# Patient Record
Sex: Female | Born: 1945
Health system: Southern US, Community
[De-identification: ages and names within clinical notes are randomized; demographics above are authoritative.]

## PROBLEM LIST (undated history)

## (undated) DIAGNOSIS — Z8719 Personal history of other diseases of the digestive system: Secondary | ICD-10-CM

## (undated) DIAGNOSIS — E785 Hyperlipidemia, unspecified: Secondary | ICD-10-CM

## (undated) DIAGNOSIS — K5790 Diverticulosis of intestine, part unspecified, without perforation or abscess without bleeding: Secondary | ICD-10-CM

## (undated) DIAGNOSIS — K219 Gastro-esophageal reflux disease without esophagitis: Secondary | ICD-10-CM

## (undated) DIAGNOSIS — M1990 Unspecified osteoarthritis, unspecified site: Secondary | ICD-10-CM

## (undated) DIAGNOSIS — E119 Type 2 diabetes mellitus without complications: Secondary | ICD-10-CM

## (undated) DIAGNOSIS — I10 Essential (primary) hypertension: Secondary | ICD-10-CM

## (undated) DIAGNOSIS — Z923 Personal history of irradiation: Secondary | ICD-10-CM

## (undated) DIAGNOSIS — C50919 Malignant neoplasm of unspecified site of unspecified female breast: Secondary | ICD-10-CM

## (undated) HISTORY — PX: OTHER SURGICAL HISTORY: SHX169

## (undated) HISTORY — PX: NASAL SINUS SURGERY: SHX719

## (undated) HISTORY — PX: HERNIA REPAIR: SHX51

## (undated) HISTORY — DX: Malignant neoplasm of unspecified site of unspecified female breast: C50.919

## (undated) HISTORY — DX: Essential (primary) hypertension: I10

## (undated) HISTORY — DX: Hyperlipidemia, unspecified: E78.5

## (undated) HISTORY — DX: Diverticulosis of intestine, part unspecified, without perforation or abscess without bleeding: K57.90

## (undated) HISTORY — DX: Gastro-esophageal reflux disease without esophagitis: K21.9

## (undated) HISTORY — PX: APPENDECTOMY: SHX54

---

## 2005-09-11 ENCOUNTER — Ambulatory Visit: Payer: Self-pay

## 2005-12-03 ENCOUNTER — Inpatient Hospital Stay: Payer: Self-pay | Admitting: Internal Medicine

## 2005-12-16 ENCOUNTER — Other Ambulatory Visit: Payer: Self-pay

## 2006-09-10 ENCOUNTER — Ambulatory Visit: Payer: Self-pay

## 2007-07-22 ENCOUNTER — Ambulatory Visit: Payer: Self-pay | Admitting: Internal Medicine

## 2007-10-27 ENCOUNTER — Ambulatory Visit: Payer: Self-pay

## 2008-11-01 ENCOUNTER — Ambulatory Visit: Payer: Self-pay

## 2008-11-16 ENCOUNTER — Ambulatory Visit: Payer: Self-pay | Admitting: Internal Medicine

## 2009-11-30 ENCOUNTER — Ambulatory Visit: Payer: Self-pay | Admitting: Internal Medicine

## 2010-07-07 ENCOUNTER — Ambulatory Visit: Payer: Self-pay | Admitting: Unknown Physician Specialty

## 2010-08-08 ENCOUNTER — Ambulatory Visit: Payer: Self-pay | Admitting: Ophthalmology

## 2010-12-11 ENCOUNTER — Ambulatory Visit: Payer: Self-pay | Admitting: Internal Medicine

## 2011-12-13 ENCOUNTER — Ambulatory Visit: Payer: Self-pay | Admitting: Internal Medicine

## 2012-04-26 ENCOUNTER — Emergency Department: Payer: Self-pay | Admitting: *Deleted

## 2012-10-14 DIAGNOSIS — Z923 Personal history of irradiation: Secondary | ICD-10-CM

## 2012-10-14 DIAGNOSIS — C50919 Malignant neoplasm of unspecified site of unspecified female breast: Secondary | ICD-10-CM

## 2012-10-14 HISTORY — PX: BREAST LUMPECTOMY: SHX2

## 2012-10-14 HISTORY — DX: Personal history of irradiation: Z92.3

## 2012-10-14 HISTORY — DX: Malignant neoplasm of unspecified site of unspecified female breast: C50.919

## 2012-12-14 ENCOUNTER — Ambulatory Visit: Payer: Self-pay

## 2012-12-21 ENCOUNTER — Ambulatory Visit: Payer: Self-pay

## 2013-02-01 ENCOUNTER — Ambulatory Visit: Payer: Self-pay | Admitting: Surgery

## 2013-02-04 HISTORY — PX: BREAST BIOPSY: SHX20

## 2013-02-09 ENCOUNTER — Ambulatory Visit: Payer: Self-pay | Admitting: Surgery

## 2013-02-09 LAB — BASIC METABOLIC PANEL
BUN: 16 mg/dL (ref 7–18)
Co2: 29 mmol/L (ref 21–32)
Creatinine: 0.88 mg/dL (ref 0.60–1.30)
Osmolality: 274 (ref 275–301)
Potassium: 4 mmol/L (ref 3.5–5.1)
Sodium: 137 mmol/L (ref 136–145)

## 2013-02-09 LAB — CBC WITH DIFFERENTIAL/PLATELET
Basophil #: 0.1 10*3/uL (ref 0.0–0.1)
Basophil %: 1 %
Eosinophil %: 1 %
Lymphocyte %: 42.8 %
MCH: 27.8 pg (ref 26.0–34.0)
MCV: 85 fL (ref 80–100)
Monocyte %: 9.3 %
Neutrophil #: 3.5 10*3/uL (ref 1.4–6.5)
Platelet: 306 10*3/uL (ref 150–440)
RBC: 4.33 10*6/uL (ref 3.80–5.20)
RDW: 16.3 % — ABNORMAL HIGH (ref 11.5–14.5)
WBC: 7.7 10*3/uL (ref 3.6–11.0)

## 2013-02-15 ENCOUNTER — Ambulatory Visit: Payer: Self-pay | Admitting: Surgery

## 2013-03-02 ENCOUNTER — Ambulatory Visit: Payer: Self-pay | Admitting: Internal Medicine

## 2013-03-14 ENCOUNTER — Ambulatory Visit: Payer: Self-pay | Admitting: Internal Medicine

## 2013-03-15 ENCOUNTER — Ambulatory Visit: Payer: Self-pay | Admitting: Surgery

## 2013-04-08 LAB — CBC CANCER CENTER
Basophil #: 0 x10 3/mm (ref 0.0–0.1)
Basophil %: 0.2 %
Eosinophil #: 0.2 x10 3/mm (ref 0.0–0.7)
Eosinophil %: 3 %
HGB: 11.4 g/dL — ABNORMAL LOW (ref 12.0–16.0)
Lymphocyte %: 28.9 %
Monocyte #: 0.6 x10 3/mm (ref 0.2–0.9)
Monocyte %: 8.1 %
Neutrophil #: 4.1 x10 3/mm (ref 1.4–6.5)
Neutrophil %: 59.8 %
Platelet: 337 x10 3/mm (ref 150–440)
RBC: 4.08 10*6/uL (ref 3.80–5.20)
RDW: 15.8 % — ABNORMAL HIGH (ref 11.5–14.5)
WBC: 6.9 x10 3/mm (ref 3.6–11.0)

## 2013-04-08 LAB — HEPATIC FUNCTION PANEL A (ARMC)
Albumin: 3.6 g/dL (ref 3.4–5.0)
Bilirubin, Direct: 0.1 mg/dL (ref 0.00–0.20)
SGOT(AST): 28 U/L (ref 15–37)

## 2013-04-13 ENCOUNTER — Ambulatory Visit: Payer: Self-pay | Admitting: Internal Medicine

## 2013-07-26 ENCOUNTER — Emergency Department: Payer: Self-pay | Admitting: Emergency Medicine

## 2013-09-24 ENCOUNTER — Ambulatory Visit: Payer: Self-pay | Admitting: Internal Medicine

## 2013-11-11 ENCOUNTER — Ambulatory Visit: Payer: Self-pay | Admitting: Internal Medicine

## 2013-11-11 LAB — CBC CANCER CENTER
Basophil #: 0.1 x10 3/mm (ref 0.0–0.1)
Basophil %: 1.3 %
Eosinophil #: 0.1 x10 3/mm (ref 0.0–0.7)
Eosinophil %: 1.7 %
HCT: 36.4 % (ref 35.0–47.0)
HGB: 11.8 g/dL — ABNORMAL LOW (ref 12.0–16.0)
Lymphocyte #: 2 x10 3/mm (ref 1.0–3.6)
Lymphocyte %: 40.2 %
MCH: 28.5 pg (ref 26.0–34.0)
MCHC: 32.4 g/dL (ref 32.0–36.0)
MCV: 88 fL (ref 80–100)
MONOS PCT: 7.7 %
Monocyte #: 0.4 x10 3/mm (ref 0.2–0.9)
Neutrophil #: 2.5 x10 3/mm (ref 1.4–6.5)
Neutrophil %: 49.1 %
Platelet: 271 x10 3/mm (ref 150–440)
RBC: 4.13 10*6/uL (ref 3.80–5.20)
RDW: 16.1 % — ABNORMAL HIGH (ref 11.5–14.5)
WBC: 5.1 x10 3/mm (ref 3.6–11.0)

## 2013-11-11 LAB — HEPATIC FUNCTION PANEL A (ARMC)
ALT: 19 U/L (ref 12–78)
Albumin: 3.5 g/dL (ref 3.4–5.0)
Alkaline Phosphatase: 75 U/L
BILIRUBIN DIRECT: 0.3 mg/dL — AB (ref 0.00–0.20)
BILIRUBIN TOTAL: 0.4 mg/dL (ref 0.2–1.0)
SGOT(AST): 23 U/L (ref 15–37)
Total Protein: 7.4 g/dL (ref 6.4–8.2)

## 2013-11-11 LAB — CREATININE, SERUM
Creatinine: 0.88 mg/dL (ref 0.60–1.30)
EGFR (African American): >60
EGFR (Non-African Amer.): >60

## 2013-11-14 ENCOUNTER — Ambulatory Visit: Payer: Self-pay | Admitting: Internal Medicine

## 2013-11-14 DIAGNOSIS — K5790 Diverticulosis of intestine, part unspecified, without perforation or abscess without bleeding: Secondary | ICD-10-CM

## 2013-11-14 HISTORY — DX: Diverticulosis of intestine, part unspecified, without perforation or abscess without bleeding: K57.90

## 2013-11-15 ENCOUNTER — Ambulatory Visit: Payer: Self-pay | Admitting: Gastroenterology

## 2013-12-15 ENCOUNTER — Ambulatory Visit: Payer: Self-pay | Admitting: Obstetrics and Gynecology

## 2014-01-17 DIAGNOSIS — E119 Type 2 diabetes mellitus without complications: Secondary | ICD-10-CM | POA: Insufficient documentation

## 2014-01-17 DIAGNOSIS — M542 Cervicalgia: Secondary | ICD-10-CM | POA: Insufficient documentation

## 2014-01-17 DIAGNOSIS — I10 Essential (primary) hypertension: Secondary | ICD-10-CM | POA: Insufficient documentation

## 2014-01-17 DIAGNOSIS — E7800 Pure hypercholesterolemia, unspecified: Secondary | ICD-10-CM | POA: Insufficient documentation

## 2014-01-17 DIAGNOSIS — D649 Anemia, unspecified: Secondary | ICD-10-CM | POA: Insufficient documentation

## 2014-05-12 ENCOUNTER — Ambulatory Visit: Payer: Self-pay | Admitting: Internal Medicine

## 2014-05-12 LAB — CREATININE, SERUM
Creatinine: 1.13 mg/dL (ref 0.60–1.30)
EGFR (African American): 57 — ABNORMAL LOW
EGFR (Non-African Amer.): 49 — ABNORMAL LOW

## 2014-05-12 LAB — HEPATIC FUNCTION PANEL A (ARMC)
ALT: 18 U/L
Albumin: 3.3 g/dL — ABNORMAL LOW (ref 3.4–5.0)
Alkaline Phosphatase: 78 U/L
Bilirubin, Direct: <0.05 mg/dL (ref 0.00–0.20)
Bilirubin,Total: 0.3 mg/dL (ref 0.2–1.0)
SGOT(AST): 24 U/L (ref 15–37)
Total Protein: 7.1 g/dL (ref 6.4–8.2)

## 2014-05-12 LAB — CBC CANCER CENTER
Basophil #: 0.1 x10 3/mm (ref 0.0–0.1)
Basophil %: 1.2 %
EOS ABS: 0.1 x10 3/mm (ref 0.0–0.7)
Eosinophil %: 2.2 %
HCT: 36.2 % (ref 35.0–47.0)
HGB: 11.8 g/dL — ABNORMAL LOW (ref 12.0–16.0)
LYMPHS PCT: 43.7 %
Lymphocyte #: 2.3 x10 3/mm (ref 1.0–3.6)
MCH: 28.9 pg (ref 26.0–34.0)
MCHC: 32.5 g/dL (ref 32.0–36.0)
MCV: 89 fL (ref 80–100)
MONOS PCT: 7.8 %
Monocyte #: 0.4 x10 3/mm (ref 0.2–0.9)
NEUTROS PCT: 45.1 %
Neutrophil #: 2.4 x10 3/mm (ref 1.4–6.5)
Platelet: 242 x10 3/mm (ref 150–440)
RBC: 4.08 10*6/uL (ref 3.80–5.20)
RDW: 15.6 % — ABNORMAL HIGH (ref 11.5–14.5)
WBC: 5.2 x10 3/mm (ref 3.6–11.0)

## 2014-05-14 ENCOUNTER — Ambulatory Visit: Payer: Self-pay | Admitting: Internal Medicine

## 2014-08-14 DIAGNOSIS — M8580 Other specified disorders of bone density and structure, unspecified site: Secondary | ICD-10-CM | POA: Insufficient documentation

## 2014-08-14 DIAGNOSIS — M810 Age-related osteoporosis without current pathological fracture: Secondary | ICD-10-CM | POA: Insufficient documentation

## 2014-10-14 HISTORY — PX: BREAST BIOPSY: SHX20

## 2014-12-06 DIAGNOSIS — L280 Lichen simplex chronicus: Secondary | ICD-10-CM | POA: Insufficient documentation

## 2014-12-19 ENCOUNTER — Ambulatory Visit: Payer: Self-pay | Admitting: Obstetrics and Gynecology

## 2014-12-20 ENCOUNTER — Ambulatory Visit: Payer: Self-pay | Admitting: Obstetrics and Gynecology

## 2015-01-24 ENCOUNTER — Ambulatory Visit
Admit: 2015-01-24 | Disposition: A | Discharge status: Home / Self Care | Payer: Self-pay | Attending: Surgery | Admitting: Surgery

## 2015-02-03 NOTE — Op Note (Signed)
PATIENT NAME:  Katie Thompson, Katie Thompson MR#:  [id number] DATE OF BIRTH:  03-05-1946  DATE OF PROCEDURE:  03/15/2013  PREOPERATIVE DIAGNOSIS: Carcinoma of the left breast.   POSTOPERATIVE DIAGNOSIS: Carcinoma of the left breast.  PROCEDURE: Insertion of MammoSite catheter into the left breast mass.   SURGEON: Rochel Brome, MD   ANESTHESIA: Local 1% Xylocaine with epinephrine with monitored anesthesia care.   INDICATIONS: This 69 year old female recently had a left partial mastectomy with findings of ductal carcinoma in situ, and Dr. Donella Stade saw her for consultation and recommended MammoSite therapy.  I did an ultrasound in the office, and it appeared to be an adequate seroma with adequate amount of subcutaneous tissue.   DESCRIPTION OF PROCEDURE: The patient was placed on the operating table in the supine position and sedated and monitored by the anesthesia staff. The left arm was placed in a lateral arm support. The left breast was examined with ultrasound demonstrating location of the seroma just below her scar. The breast was prepared with ChloraPrep and draped in a sterile manner. The ultrasound was placed into its sleeve and further identified the seroma. Next, the skin lateral to this seroma was infiltrated with 1% Xylocaine with epinephrine. A lancing incision was made approximately 1 cm in length. Using ultrasound guidance, the trocar was advanced into the seroma and drained some serosanguineous fluid.  I then removed the trocar, inserted the Frazier suction, and suctioned out some additional serosanguineous fluid. Subsequently, the MammoSite catheter was inserted. It was inflated with 10% solution of Isovue with saline and inflated with 30 mL of water.  The subsequent ultrasound did demonstrate that the balloon was approximately 9 mm from the skin, and was no drainage from the incision. The site was dressed with gauze, benzoin and paper tape.       The patient tolerated the procedure satisfactorily  and was then prepared for transfer to the recovery room.  ____________________________ Lenna Sciara. Rochel Brome, MD jws:cb D: 03/15/2013 15:23:43 ET T: 03/15/2013 20:34:15 ET JOB#: [id number]  cc: Loreli Dollar, MD, <Dictator> Loreli Dollar MD ELECTRONICALLY SIGNED 03/19/2013 9:30

## 2015-02-03 NOTE — Op Note (Signed)
PATIENT NAME:  Katie Thompson, Katie Thompson MR#:  [id number] DATE OF BIRTH:  September 30, 1946  DATE OF PROCEDURE:  02/15/2013  PREOPERATIVE DIAGNOSIS: Ductal carcinoma in situ, left breast.   POSTOPERATIVE DIAGNOSIS: Ductal carcinoma in situ, left breast.   PROCEDURE: Left partial mastectomy.   ANESTHESIA: General.   SURGEON: Rochel Brome.   INDICATIONS: This 69 year old female recently had microcalcifications found in the upper outer quadrant, left breast. Needle biopsy demonstrated ductal carcinoma in situ, and surgery was recommended for definitive treatment.   The patient was placed on the operating table in the supine position under general anesthesia. The dressing was removed from the left breast, exposing the Kopan's wire, which was cut 2 cm from the skin. The operative site was prepared with ChloraPrep and draped in a sterile manner.   Mammogram images were reviewed seeing the location of the biopsy marker and the Kopan's wire. There was a palpable mass within the breast just below the wire which appeared that it may be a hematoma, and she did not have a palpable mass before the biopsy, but I did elect to remove the mass with the wire. An obliquely-oriented curvilinear incision was made from approximately  12 o'clock to the 2 o'clock positions in the peripheral aspect of the left breast, removing an ellipse of skin which was approximately 12 mm in width. The dissection was carried down through subcutaneous tissues. I encountered the wire and palpated the mass during the course of the dissection. I removed the entire palpable mass, which was about 4 cm in dimension, also surrounding normal-appearing breast tissue and resected down to the tip of the wire. The wire was left in the specimen as it was excised. The 2 o'clock position of the skin ellipse was tagged with a 3-0 nylon stitch, and also margin maps were sutured to the specimen to mark the cranial, caudal, medial, lateral, and deep margins and the specimen  was sent for specimen mammogram and pathology to check for margins.   The wound was inspected. It was noted that 1 clamped artery was suture-ligated with  4-0 chromic. It was noted during the course of the procedure a number of small bleeding points were cauterized. Additional small bleeding points were cauterized. Also, tissues were infiltrated with a total of 20 mL of 0.5% Sensorcaine with epinephrine. Hemostasis was subsequently intact. The subcutaneous tissues were closed with interrupted 4-0 chromic. The skin was closed with a running 5-0 Monocryl subcuticular suture.   I subsequently waited for the pathologist's call. Reported that the margins appeared to be clear and the biopsy cavity was closest to the deep margin. Subsequently the wound was dressed with Dermabond. The patient appeared to be in satisfactory condition and was prepared for transfer to the recovery room.    ____________________________ Lenna Sciara. Rochel Brome, MD jws:dm D: 02/15/2013 10:54:56 ET T: 02/15/2013 11:12:46 ET JOB#: [id number]  cc: Loreli Dollar, MD, <Dictator> Loreli Dollar MD ELECTRONICALLY SIGNED 02/16/2013 21:11

## 2015-02-03 NOTE — Consult Note (Signed)
Reason for Visit: This 69 year old Female patient presents to the clinic for initial evaluation of  breast cancer .   Referred by Dr. Ma Hillock.  Diagnosis:  Chief Complaint/Diagnosis   69 year old female with stage 0 (Tis N0 M0) ductal carcinoma in situ ER/PR positive status post wide local excision.  Pathology Report pathology report reviewed   Imaging Report mammograms reviewed   Referral Report clinical notes reviewed   Planned Treatment Regimen possible accelerated partial breast radiation   HPI   patient is a 69 year old female with significant past medical history for borderline diabetes hypertension was found to have an abnormal mammogram of the left breast in early March showingabnormal microcalcifications in the left upper breast. She underwent a biopsy showing ductal carcinoma in situ then underwent wide local excision on May 5 showing grade 2 perform type ductal carcinoma in situ with clear margins tumor was ER/PR positive. She's been seen by medical oncology will be prescribed tamoxifen after completion of radiation. She is seen today for consideration of treatment. She is doing well. She specifically denies breast tenderness cough or bone pain.  Past Hx:    GERD:    diverticulosis:    Hypercholesterolemia:    sinusitis:    Hypertension:    Diabetes:    hernia repair:    lumpectomy X 2:    appendectomy:   Past, Family and Social History:  Past Medical History positive   Cardiovascular hyperlipidemia; hypertension   Gastrointestinal GERD; diverticulitis   Endocrine diabetes mellitus   Infections bacterial meningitis in 2007   Past Surgical History herniorrhaphy repair, breast biopsy x2, appendectomy,bilateral tubal ligation, appendectomy, sinus surgery   Past Medical History Comments sinusitis   Family History positive   Family History Comments family history of adult onset diabetes and hypertension, mother with ovarian cancer, aunt with history of  breast cancer   Social History noncontributory   Allergies:   Codeine: GI Distress  Tequin: Hives  Home Meds:  Home Medications: Medication Instructions Status  aspirin 81 mg oral tablet 1 tab(s) orally once a day (in the morning). Off for 2 weeks due to biopsy and surgery Active  hydrochlorothiazide 25 mg oral tablet 1 tab(s) orally once a day (in the morning) Active  Zegerid OTC 20 mg-1100 mg oral capsule 1 cap(s) orally once a day (in the morning) Active  atorvastatin 10 mg oral tablet 1 tab(s) orally once a day (in the morning) Active  verapamil 24 hour extended release 240 mg/24 hours oral capsule, extended release 1 cap(s) orally once a day (in the morning) Active  Vitamin D3 1000 intl units oral tablet 1 tab(s) orally 2 times a day Active  Norco 325 mg-5 mg oral tablet 1-2 tab(s) orally every 4 hours as needed for pain Active   Review of Systems:  General negative   Performance Status (ECOG) 0   Skin negative   Breast see HPI   Ophthalmologic negative   ENMT negative   Respiratory and Thorax negative   Cardiovascular negative   Gastrointestinal negative   Genitourinary negative   Musculoskeletal negative   Neurological negative   Psychiatric negative   Hematology/Lymphatics negative   Endocrine negative   Allergic/Immunologic negative   Review of Systems   according to nurse's notesPatient denies any weight loss, fatigue, weakness, fever, chills or night sweats. Patient denies any loss of vision, blurred vision. Patient denies any ringing  of the ears or hearing loss. No irregular heartbeat. Patient denies heart murmur or history of fainting.  Patient denies any chest pain or pain radiating to her upper extremities. Patient denies any shortness of breath, difficulty breathing at night, cough or hemoptysis. Patient denies any swelling in the lower legs. Patient denies any nausea vomiting, vomiting of blood, or coffee ground material in the vomitus. Patient  denies any stomach pain. Patient states has had normal bowel movements no significant constipation or diarrhea. Patient denies any dysuria, hematuria or significant nocturia. Patient denies any problems walking, swelling in the joints or loss of balance. Patient denies any skin changes, loss of hair or loss of weight. Patient denies any excessive worrying or anxiety or significant depression. Patient denies any problems with insomnia. Patient denies excessive thirst, polyuria, polydipsia. Patient denies any swollen glands, patient denies easy bruising or easy bleeding. Patient denies any recent infections, allergies or URI. Patient "s visual fields have not changed significantly in recent time.  Nursing Notes:  Nursing Vital Signs and Chemo Nursing Nursing Notes: *CC Vital Signs Flowsheet:   22-May-14 13:20  Temp Temperature 97.1  Pulse Pulse 88  Respirations Respirations 20  SBP SBP 159  DBP DBP 88  Pain Scale (0-10)  0  Current Weight (kg) (kg) 88.6  Height (cm) centimeters 161.5  BSA (m2) 1.9   Physical Exam:  General/Skin/HEENT:  General normal   Skin normal   Eyes normal   ENMT normal   Head and Neck normal   Additional PE patient status post wide local excision of the left breast. Incision is well-healed. No dominant mass or nodularity is noted in either breast into position examined. No axillary or supraclavicular adenopathy is identified.   Breasts/Resp/CV/GI/GU:  Respiratory and Thorax normal   Cardiovascular normal   Gastrointestinal normal   Genitourinary normal   MS/Neuro/Psych/Lymph:  Musculoskeletal normal   Neurological normal   Lymphatics normal   Other Results:  Radiology Results: LabUnknown:    10-Mar-14 08:42, Digital Additional Views Lt Breast [facility])  PACS Image   [facility]:  Digital Additional Views Lt Breast (SCR)   REASON FOR EXAM:    av lt calcs  COMMENTS:       PROCEDURE: MAM - MAM DGTL ADD VW LT  SCR  - Dec 21 2012  8:42AM      RESULT:     Comparison: 12/14/2012, 12/13/2011, 12/11/2010, 11/30/2009.    Findings:    True lateral and spot compression magnification views were performed to   evaluate the small asymmetry in the central aspect of the posterior depth   of the left CC view. With these views, the asymmetry effaced and assumed   the appearance of normal fibroglandular tissue that was similar to prior     studies.    Spot compression magnification views were also performed of the cluster   of microcalcifications in the superior left breast. Again, these are   likely located in the lateral aspect of the left CC view. These are best   demonstrated on thespot compression magnification MLO view. Some   calcifications are round while others are amorphous. There is some motion   artifact on the CC and true lateral magnification views.    IMPRESSION:    1. BI-RADS: Category 4 - Suspicious Abnormality.  2. Surgical consultation and tissue diagnosis are recommended for the   small cluster of suspicious microcalcifications in the superior left   breast. This could be attempted with Stereotactic Guided Biopsy.   BREAST COMPOSITION: The breast compositionis SCATTERED FIBROGLANDULAR   TISSUE (glandular tissue is 25-50%).  Thank you for this opportunity to contribute to the care of your patient.    A NEGATIVE MAMMOGRAM REPORT DOES NOT PRECLUDE BIOPSY OR OTHER EVALUATION   OF A CLINICALLY PALPABLE OROTHERWISE SUSPICIOUS MASS OR LESION. BREAST   CANCER MAY NOT BE DETECTED BY MAMMOGRAPHY IN UP TO 10% OF CASES.         Verified By: Gregor Hams, M.D., MD   Relevent Results:   Relevant Scans and Labs mammograms were reviewed   Assessment and Plan: Impression:   ductal carcinoma in situ of the left breast as those wide local excision ER/PR positive and 69 year old female Plan:   based on the low volume of disease her age patient would be a good candidate for accelerated partial breast irradiation to  deliver 3400 cGy in 10 fractions at 340 cGy twice a day. I've gone over risks and benefits of all whole breast radiation as well as accelerated partial breast irradiation. We'll contact and Dr. Thompson Caul office for consideration of placing MammoSite catheter. She catheter not be a locally placed or if it's too close to skin or chest wall will go back and treated whole breast radiation therapy. Patient seems to comprehend my treatment plan well. Arrangements are made for MammoSite catheter placement.  I would like to take this opportunity to thank you for allowing me to continue to participate in this patient's care.  Electronic Signatures: Armstead Peaks (MD)  (Signed 28-May-14 14:16)  Authored: HPI, Diagnosis, Past Hx, PFSH, Allergies, Home Meds, ROS, Nursing Notes, Physical Exam, Other Results, Relevent Results, Encounter Assessment and Plan   Last Updated: 28-May-14 14:16 by Armstead Peaks (MD)

## 2015-02-03 NOTE — Op Note (Signed)
PATIENT NAME:  Katie Thompson, Katie Thompson MR#:  [id number] DATE OF BIRTH:  [birth year]  DATE OF PROCEDURE:  03/18/2013  DIAGNOSIS:  Breast cancer.    Ms. Mccullum underwent her 4th out of a planned 10 fractions for her left breast DCIS today.  The planned dose was delivered via (Dictation Anomaly) <<MISSING TEXT>> positions.  The planned source of activity was 7.09 Ci, which required 297.6 seconds to deliver.  I verified the measurement of the balloon placement along with physics, using a scout view.  I verified the position of the catheter and the correct position in the breast.  I observed the patients treatment.  She was discharged in stable condition and will return tomorrow for her 3rd and final treatment.    ____________________________ Thea Silversmith, MD sw:cc D: 03/18/2013 14:23:00 ET T: 03/18/2013 16:49:36 ET JOB#: [id number]  cc: Thea Silversmith, MD, <Dictator>

## 2015-02-06 LAB — SURGICAL PATHOLOGY

## 2015-03-22 ENCOUNTER — Other Ambulatory Visit: Payer: Self-pay | Admitting: Infectious Diseases

## 2015-03-22 DIAGNOSIS — R600 Localized edema: Secondary | ICD-10-CM

## 2015-03-23 ENCOUNTER — Ambulatory Visit
Admission: RE | Admit: 2015-03-23 | Discharge: 2015-03-23 | Disposition: A | Discharge status: Home / Self Care | Payer: BLUE CROSS/BLUE SHIELD | Source: Ambulatory Visit | Attending: Infectious Diseases | Admitting: Infectious Diseases

## 2015-03-23 DIAGNOSIS — R600 Localized edema: Secondary | ICD-10-CM | POA: Insufficient documentation

## 2015-03-23 DIAGNOSIS — M79604 Pain in right leg: Secondary | ICD-10-CM | POA: Diagnosis present

## 2015-04-25 ENCOUNTER — Other Ambulatory Visit: Payer: Self-pay | Admitting: Internal Medicine

## 2015-05-17 ENCOUNTER — Other Ambulatory Visit: Payer: Self-pay | Admitting: *Deleted

## 2015-05-17 DIAGNOSIS — D0512 Intraductal carcinoma in situ of left breast: Secondary | ICD-10-CM

## 2015-05-18 ENCOUNTER — Encounter: Payer: Self-pay | Admitting: Internal Medicine

## 2015-05-18 ENCOUNTER — Inpatient Hospital Stay: Payer: BLUE CROSS/BLUE SHIELD | Attending: Internal Medicine

## 2015-05-18 ENCOUNTER — Ambulatory Visit (HOSPITAL_BASED_OUTPATIENT_CLINIC_OR_DEPARTMENT_OTHER): Payer: BLUE CROSS/BLUE SHIELD | Admitting: Internal Medicine

## 2015-05-18 VITALS — BP 140/75 | HR 70 | Temp 97.8°F | Resp 18 | Ht 63.0 in | Wt 192.5 lb

## 2015-05-18 DIAGNOSIS — Z923 Personal history of irradiation: Secondary | ICD-10-CM | POA: Diagnosis not present

## 2015-05-18 DIAGNOSIS — Z170 Estrogen receptor positive status [ER+]: Secondary | ICD-10-CM | POA: Insufficient documentation

## 2015-05-18 DIAGNOSIS — D0512 Intraductal carcinoma in situ of left breast: Secondary | ICD-10-CM

## 2015-05-18 DIAGNOSIS — Z79810 Long term (current) use of selective estrogen receptor modulators (SERMs): Secondary | ICD-10-CM | POA: Diagnosis not present

## 2015-05-18 LAB — CBC WITH DIFFERENTIAL/PLATELET
BASOS ABS: 0 10*3/uL (ref 0–0.1)
BASOS PCT: 1 %
Eosinophils Absolute: 0.1 10*3/uL (ref 0–0.7)
Eosinophils Relative: 2 %
HCT: 36.1 % (ref 35.0–47.0)
Hemoglobin: 11.8 g/dL — ABNORMAL LOW (ref 12.0–16.0)
LYMPHS ABS: 2.2 10*3/uL (ref 1.0–3.6)
Lymphocytes Relative: 37 %
MCH: 28.3 pg (ref 26.0–34.0)
MCHC: 32.7 g/dL (ref 32.0–36.0)
MCV: 86.6 fL (ref 80.0–100.0)
MONO ABS: 0.4 10*3/uL (ref 0.2–0.9)
MONOS PCT: 7 %
NEUTROS ABS: 3.2 10*3/uL (ref 1.4–6.5)
Neutrophils Relative %: 53 %
PLATELETS: 267 10*3/uL (ref 150–440)
RBC: 4.17 MIL/uL (ref 3.80–5.20)
RDW: 15.6 % — ABNORMAL HIGH (ref 11.5–14.5)
WBC: 5.9 10*3/uL (ref 3.6–11.0)

## 2015-05-18 LAB — HEPATIC FUNCTION PANEL
ALT: 12 U/L — ABNORMAL LOW (ref 14–54)
AST: 21 U/L (ref 15–41)
Albumin: 3.8 g/dL (ref 3.5–5.0)
Alkaline Phosphatase: 78 U/L (ref 38–126)
BILIRUBIN INDIRECT: 0 mg/dL — AB (ref 0.3–0.9)
BILIRUBIN TOTAL: 0.6 mg/dL (ref 0.3–1.2)
Bilirubin, Direct: <1 mg/dL — ABNORMAL HIGH (ref 0.1–0.5)
TOTAL PROTEIN: 7.5 g/dL (ref 6.5–8.1)

## 2015-05-18 LAB — CREATININE, SERUM
CREATININE: 1.12 mg/dL — AB (ref 0.44–1.00)
GFR calc Af Amer: 56 mL/min — ABNORMAL LOW (ref >60)
GFR calc non Af Amer: 48 mL/min — ABNORMAL LOW (ref >60)

## 2015-05-18 NOTE — Progress Notes (Signed)
Pt had mammogram in march 2016 and it showed probable fibrosis and sent her for u/s and then bx 3/816 and there was no malignancy. She has not felt any abnormalities on breast exam that she does herself.  She did have right leg swelling and saw PCP who did doppler that was negative

## 2015-05-26 NOTE — Progress Notes (Signed)
[name]  [phone] [phone]     ID: Katie Thompson OB: 05-Mar-1946  MR#: [id number]  BDZ#:[id number]  Patient Care Team: Katie Prows, MD as PCP - General (Infectious Diseases)  CHIEF COMPLAINT/DIAGNOSIS:  Grade 2 ductal carcinoma in situ  (pTis Nx, cribriform type)  of the left breast status post partial mastectomy on 02/15/2013.  ER positive (75%), PR positive (25%)  -   Patient completed radiation, started Tamoxifen therapy end of June 2014 (planned for 5 years).   HISTORY OF PRESENT ILLNESS:  Patient returns for continued oncology evaluation, she was seen 1 year ago. States that she had recent lower leg swelling but Doppler by primary physician on 04/02/2015 was negative for DVT yet states that swelling is doing better at this time. States that she does remain physically active and ambulatory. She is on tamoxifen treatment, states that she is tolerating this well and denies new side effects.  Denies history of thromboembolic phenomena including DVT, PE, TIA, stroke or coronary artery disease.  Denies any abnormal vaginal bleeding. Denies any other new complaints, states that she has chronic peripheral neuropathy symptoms in her hands which is unchanged. Appetite is steady, denies unintentional weight loss.  Denies feeling any new breast masses on self-exam. No new bone pains.  REVIEW OF SYSTEMS:   ROS As in HPI above. In addition, no fever, chills or sweats. No new headaches or focal weakness.  No new mood disturbances. No  sore throat, cough, shortness of breath, sputum, hemoptysis or chest pain. No dizziness or palpitation. No abdominal pain, constipation, diarrhea, dysuria or hematuria. No new skin rash or bleeding symptoms. No new paresthesias in extremities.  Otherwise, a complete review of systems is negative.  PAST MEDICAL HISTORY: Reviewed. Past Medical History  Diagnosis Date  . Breast cancer   . Hypertension   . Hyperlipidemia   .  GERD (gastroesophageal reflux disease)   . Diverticulosis feb. 2015    from colonoscopy          Hypertension  Hyperlipidemia  GERD  Borderline diabetes not requiring medical treatment  Hernia repair  Bacterial meningitis 2007  Bilateral tubal ligation  Appendectomy  Sinus surgery  Colonoscopy February 2015 reported diverticulosis.  PAST SURGICAL HISTORY: Reviewed. Past Surgical History  Procedure Laterality Date  . Appendectomy    . Hernia repair    . Nasal sinus surgery      FAMILY HISTORY: Reviewed. Family History  Problem Relation Age of Onset  . Ovarian cancer Mother   . Hypertension Mother   . Diabetes Paternal Grandmother   . Hypertension Paternal Grandmother     SOCIAL HISTORY: Reviewed. Social History  Substance Use Topics  . Smoking status: Never Smoker   . Smokeless tobacco: Never Used  . Alcohol Use: No    Allergies  Allergen Reactions  . Codeine Nausea And Vomiting  . Gatifloxacin Hives    Current Outpatient Prescriptions  Medication Sig Dispense Refill  . aspirin EC 81 MG tablet Take by mouth.    Marland Kitchen atorvastatin (LIPITOR) 10 MG tablet Take by mouth.    . hydrochlorothiazide (HYDRODIURIL) 25 MG tablet Take by mouth.    Katie Thompson Bicarbonate (ZEGERID) 20-1100 MG CAPS capsule Take by mouth.    . tamoxifen (NOLVADEX) 20 MG tablet take 1 tablet by mouth once daily 30 tablet 11  . verapamil (CALAN-SR) 240 MG CR tablet Take by mouth.    . Vitamin D, Ergocalciferol, (DRISDOL) 50000 UNITS CAPS capsule Take by mouth.    Marland Kitchen  tamoxifen (NOLVADEX) 20 MG tablet Take by mouth.     No current facility-administered medications for this visit.    PHYSICAL EXAM: Filed Vitals:   05/18/15 1028  BP: 140/75  Pulse: 70  Temp: 97.8 F (36.6 C)  Resp: 18     Body mass index is 34.1 kg/(m^2).    ECOG FS:0 - Asymptomatic  GENERAL: Patient is alert and oriented and in no acute distress. There is no icterus. HEENT: EOMs intact. Oral exam negative for  thrush or lesions. No cervical lymphadenopathy. CVS: S1S2, regular LUNGS: Bilaterally clear to auscultation, no rhonchi. ABDOMEN: Soft, nontender. No hepatomegaly clinically.  EXTREMITIES: No pedal edema. BREASTS: no dominant masses palpable in either breast. No axillary adenopathy on either side. Exam performed in presence of a nurse.   LAB RESULTS:    Component Value Date/Time   NA 137 02/09/2013 1604   K 4.0 02/09/2013 1604   CL 101 02/09/2013 1604   CO2 29 02/09/2013 1604   GLUCOSE 80 02/09/2013 1604   BUN 16 02/09/2013 1604   CREATININE 1.12* 05/18/2015 1003   CREATININE 1.13 05/12/2014 1032   CALCIUM 9.5 02/09/2013 1604   PROT 7.5 05/18/2015 1003   PROT 7.1 05/12/2014 1032   ALBUMIN 3.8 05/18/2015 1003   ALBUMIN 3.3* 05/12/2014 1032   AST 21 05/18/2015 1003   AST 24 05/12/2014 1032   ALT 12* 05/18/2015 1003   ALT 18 05/12/2014 1032   ALKPHOS 78 05/18/2015 1003   ALKPHOS 78 05/12/2014 1032   BILITOT 0.6 05/18/2015 1003   BILITOT 0.3 05/12/2014 1032   GFRNONAA 48* 05/18/2015 1003   GFRNONAA 49* 05/12/2014 1032   GFRAA 56* 05/18/2015 1003   GFRAA 57* 05/12/2014 1032    Lab Results  Component Value Date   WBC 5.9 05/18/2015   NEUTROABS 3.2 05/18/2015   HGB 11.8* 05/18/2015   HCT 36.1 05/18/2015   MCV 86.6 05/18/2015   PLT 267 05/18/2015    No results found for: IRON    STUDIES: 12/15/13 - Mammogram. IMPRESSION: No evidence of malignancy in either breast.  RECOMMENDATION: Diagnostic mammogram in 1 year is recommended. BI-RADS CATEGORY  2: Benign finding(s).  12/19/14 - Mammogram. IMPRESSION: Mildly suspicious mammographic/ sonographic mass 11:30 position left breast likely representing a focus of apocrine metaplasia or fibrocystic change.  RECOMMENDATION:  Ultrasound-guided core needle biopsy is recommended. The patient prefers biopsy over six-month followup. BI-RADS CATEGORY  4: Suspicious.  12/20/14 - left breast ultrasound core biopsy Pathology report -   DIAGNOSIS: A. LEFT BREAST AT 11:30; ULTRASOUND-GUIDED CORE BIOPSY: BENIGN BREAST TISSUE WITH CLUSTERED MICROCYSTS AND STROMAL FIBROSIS.  Comment: There is no atypia or malignancy in this specimen. The previous history is noted. Correlation with all available imaging results is recommended to be sure the sample represents the area of concern.   ASSESSMENT / PLAN:   Grade 2 ductal carcinoma in situ  (pTis Nx, cribriform type)  of the left breast status post partial mastectomy on 02/15/2013.  ER positive (75%), PR positive (25%). Patient is on Tamoxifen therapy since end of June 2014 given that DCIS is ER positive - reviewed labs from today are discussed with patient. Also, mammogram in March was reported suspicious and she did undergo ultrasound guided biopsy which was negative for DCIS or malignancy as described above. Patient is clinically doing steady and is tolerating tamoxifen well without any new side effects.  No dominant abnormal masses on breast exam today. Patient states that next surveillance mammogram has already been scheduled  in March and she visits with surgeon Dr. Tamala Julian after it is done. Will therefore see her back in about 1 year with repeat labs including CBC, Cr, LFT. In between visits, patient was advised to call in case of any new side effects from tamoxifen, or new breast masses felt on self-exam, new symptoms or other sickness and will be evaluated sooner.  She is agreeable to above plan.   Leia Alf, MD   05/26/2015 1:48 PM

## 2015-08-29 ENCOUNTER — Other Ambulatory Visit: Payer: Self-pay | Admitting: Obstetrics and Gynecology

## 2015-08-29 DIAGNOSIS — Z853 Personal history of malignant neoplasm of breast: Secondary | ICD-10-CM

## 2015-12-21 ENCOUNTER — Other Ambulatory Visit: Payer: Self-pay | Admitting: Obstetrics and Gynecology

## 2015-12-21 ENCOUNTER — Ambulatory Visit
Admission: RE | Admit: 2015-12-21 | Discharge: 2015-12-21 | Disposition: A | Discharge status: Home / Self Care | Payer: BLUE CROSS/BLUE SHIELD | Source: Ambulatory Visit | Attending: Obstetrics and Gynecology | Admitting: Obstetrics and Gynecology

## 2015-12-21 DIAGNOSIS — Z853 Personal history of malignant neoplasm of breast: Secondary | ICD-10-CM | POA: Insufficient documentation

## 2015-12-27 DIAGNOSIS — I10 Essential (primary) hypertension: Secondary | ICD-10-CM | POA: Diagnosis not present

## 2015-12-27 DIAGNOSIS — E119 Type 2 diabetes mellitus without complications: Secondary | ICD-10-CM | POA: Diagnosis not present

## 2016-01-03 DIAGNOSIS — E7801 Familial hypercholesterolemia: Secondary | ICD-10-CM | POA: Diagnosis not present

## 2016-01-03 DIAGNOSIS — I10 Essential (primary) hypertension: Secondary | ICD-10-CM | POA: Diagnosis not present

## 2016-01-03 DIAGNOSIS — E119 Type 2 diabetes mellitus without complications: Secondary | ICD-10-CM | POA: Diagnosis not present

## 2016-01-03 DIAGNOSIS — C801 Malignant (primary) neoplasm, unspecified: Secondary | ICD-10-CM | POA: Diagnosis not present

## 2016-03-29 DIAGNOSIS — J069 Acute upper respiratory infection, unspecified: Secondary | ICD-10-CM | POA: Diagnosis not present

## 2016-04-29 DIAGNOSIS — H401131 Primary open-angle glaucoma, bilateral, mild stage: Secondary | ICD-10-CM | POA: Diagnosis not present

## 2016-04-30 DIAGNOSIS — Z23 Encounter for immunization: Secondary | ICD-10-CM | POA: Diagnosis not present

## 2016-05-07 DIAGNOSIS — H401131 Primary open-angle glaucoma, bilateral, mild stage: Secondary | ICD-10-CM | POA: Diagnosis not present

## 2016-05-13 ENCOUNTER — Other Ambulatory Visit: Payer: Self-pay | Admitting: *Deleted

## 2016-05-13 DIAGNOSIS — C50919 Malignant neoplasm of unspecified site of unspecified female breast: Secondary | ICD-10-CM

## 2016-05-14 ENCOUNTER — Inpatient Hospital Stay (HOSPITAL_BASED_OUTPATIENT_CLINIC_OR_DEPARTMENT_OTHER): Payer: BLUE CROSS/BLUE SHIELD | Admitting: Internal Medicine

## 2016-05-14 ENCOUNTER — Inpatient Hospital Stay: Payer: BLUE CROSS/BLUE SHIELD | Attending: Internal Medicine

## 2016-05-14 DIAGNOSIS — D0512 Intraductal carcinoma in situ of left breast: Secondary | ICD-10-CM | POA: Insufficient documentation

## 2016-05-14 DIAGNOSIS — D649 Anemia, unspecified: Secondary | ICD-10-CM

## 2016-05-14 DIAGNOSIS — K219 Gastro-esophageal reflux disease without esophagitis: Secondary | ICD-10-CM | POA: Diagnosis not present

## 2016-05-14 DIAGNOSIS — I10 Essential (primary) hypertension: Secondary | ICD-10-CM | POA: Diagnosis not present

## 2016-05-14 DIAGNOSIS — Z170 Estrogen receptor positive status [ER+]: Secondary | ICD-10-CM | POA: Diagnosis not present

## 2016-05-14 DIAGNOSIS — C50919 Malignant neoplasm of unspecified site of unspecified female breast: Secondary | ICD-10-CM

## 2016-05-14 DIAGNOSIS — Z7982 Long term (current) use of aspirin: Secondary | ICD-10-CM | POA: Diagnosis not present

## 2016-05-14 DIAGNOSIS — Z79899 Other long term (current) drug therapy: Secondary | ICD-10-CM | POA: Insufficient documentation

## 2016-05-14 DIAGNOSIS — Z79810 Long term (current) use of selective estrogen receptor modulators (SERMs): Secondary | ICD-10-CM

## 2016-05-14 DIAGNOSIS — Z923 Personal history of irradiation: Secondary | ICD-10-CM | POA: Insufficient documentation

## 2016-05-14 DIAGNOSIS — E785 Hyperlipidemia, unspecified: Secondary | ICD-10-CM | POA: Diagnosis not present

## 2016-05-14 DIAGNOSIS — C50812 Malignant neoplasm of overlapping sites of left female breast: Secondary | ICD-10-CM

## 2016-05-14 LAB — CBC WITH DIFFERENTIAL/PLATELET
BASOS PCT: 1 %
Basophils Absolute: 0.1 10*3/uL (ref 0–0.1)
EOS ABS: 0.1 10*3/uL (ref 0–0.7)
Eosinophils Relative: 1 %
HEMATOCRIT: 35.3 % (ref 35.0–47.0)
HEMOGLOBIN: 11.5 g/dL — AB (ref 12.0–16.0)
Lymphocytes Relative: 32 %
Lymphs Abs: 1.8 10*3/uL (ref 1.0–3.6)
MCH: 28.4 pg (ref 26.0–34.0)
MCHC: 32.5 g/dL (ref 32.0–36.0)
MCV: 87.2 fL (ref 80.0–100.0)
Monocytes Absolute: 0.4 10*3/uL (ref 0.2–0.9)
Monocytes Relative: 7 %
NEUTROS ABS: 3.4 10*3/uL (ref 1.4–6.5)
NEUTROS PCT: 59 %
Platelets: 231 10*3/uL (ref 150–440)
RBC: 4.05 MIL/uL (ref 3.80–5.20)
RDW: 15.5 % — ABNORMAL HIGH (ref 11.5–14.5)
WBC: 5.7 10*3/uL (ref 3.6–11.0)

## 2016-05-14 LAB — HEPATIC FUNCTION PANEL
ALK PHOS: 81 U/L (ref 38–126)
ALT: 14 U/L (ref 14–54)
AST: 22 U/L (ref 15–41)
Albumin: 3.7 g/dL (ref 3.5–5.0)
Bilirubin, Direct: <0.1 mg/dL — ABNORMAL LOW (ref 0.1–0.5)
TOTAL PROTEIN: 7.4 g/dL (ref 6.5–8.1)
Total Bilirubin: 0.4 mg/dL (ref 0.3–1.2)

## 2016-05-14 LAB — CREATININE, SERUM
CREATININE: 0.91 mg/dL (ref 0.44–1.00)
GFR calc Af Amer: >60 mL/min (ref >60)

## 2016-05-14 MED ORDER — TAMOXIFEN CITRATE 20 MG PO TABS: 20.0000 mg | ORAL_TABLET | Freq: Every day | ORAL | 12 refills | Status: DC

## 2016-05-14 NOTE — Assessment & Plan Note (Signed)
#   DCIS: s/p lumpectomy post radiation currently on tamoxifen for 5 years. Mammogram March 2017 normal. Tolerating well no side effects. No clinical evidence of recurrence.  # Anemia- 11.5 stable~ stable over 1 year.  # Follow-up with us in one year; mammogram ordered through your gynecologist [Dr.Jones]  # 15 minutes face-to-face with the patient discussing the above plan of care; more than 50% of time spent on natural history; counseling and coordination. 

## 2016-05-14 NOTE — Progress Notes (Signed)
RN Chaperoned provider with Breast Exam.   

## 2016-05-14 NOTE — Progress Notes (Signed)
[facility] OFFICE PROGRESS NOTE  Patient Care Team: Leonel Ramsay, MD as PCP - General (Infectious Diseases)  No matching staging information was found for the patient.   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  ER positive (75%), PR positive (25%)  [Dr.Pandit; Dr. Smith] Patient completed radiation, started Tamoxifen therapy end of June 2014 (planned for 5 years).      This is my first interaction with the patient as patient's primary oncologist has been [facility].  I reviewed the patient's prior charts/pertinent labs/imaging in detail; findings are summarized above.     INTERVAL HISTORY:  Katie Thompson 70 y.o.  female pleasant patient above history of DCIS of the left breast status post lumpectomy followed by radiation currently on tamoxifen is here for follow-up.  Patient denies any lumps or bumps. He has intermittent hot flashes. Denies any blood clots. Denies any vaginal bleeding. Follows up with gynecology/Dr. Ronnald Ramp on a yearly basis.  REVIEW OF SYSTEMS:  A complete 10 point review of system is done which is negative except mentioned above/history of present illness.   PAST MEDICAL HISTORY :  Past Medical History:  Diagnosis Date  . Breast cancer ([place]) 2014   LT with radiation  . Diverticulosis feb. 2015   from colonoscopy  . GERD (gastroesophageal reflux disease)   . Hyperlipidemia   . Hypertension     PAST SURGICAL HISTORY :   Past Surgical History:  Procedure Laterality Date  . APPENDECTOMY    . BREAST BIOPSY Left 2016   core  . BREAST LUMPECTOMY Left 2014   with radiation  . HERNIA REPAIR    . NASAL SINUS SURGERY      FAMILY HISTORY :   Family History  Problem Relation Age of Onset  . Ovarian cancer Mother   . Hypertension Mother   . Diabetes Paternal Grandmother   . Hypertension Paternal Grandmother   . Breast cancer  Paternal Aunt 51    SOCIAL HISTORY:   Social History  Substance Use Topics  . Smoking status: Never Smoker  . Smokeless tobacco: Never Used  . Alcohol use No    ALLERGIES:  is allergic to codeine and gatifloxacin.  MEDICATIONS:  Current Outpatient Prescriptions  Medication Sig Dispense Refill  . aspirin EC 81 MG tablet Take by mouth.    Marland Kitchen atorvastatin (LIPITOR) 10 MG tablet Take by mouth.    . calcium carbonate (OS-CAL - DOSED IN MG OF ELEMENTAL CALCIUM) 1250 (500 Ca) MG tablet Take by mouth.    . losartan-hydrochlorothiazide (HYZAAR) 50-12.5 MG tablet     . Omeprazole-Sodium Bicarbonate (ZEGERID) 20-1100 MG CAPS capsule Take by mouth.    . tamoxifen (NOLVADEX) 20 MG tablet Take 1 tablet (20 mg total) by mouth daily. 30 tablet 12  . verapamil (CALAN-SR) 240 MG CR tablet Take by mouth.     No current facility-administered medications for this visit.     PHYSICAL EXAMINATION: ECOG PERFORMANCE STATUS: 0 - Asymptomatic  BP (!) 159/87 (BP Location: Left Arm)   Pulse 76   Temp 98 F (36.7 C) (Tympanic)   Resp 20   Wt 188 lb 7.9 oz (85.5 kg)   BMI 33.39 kg/m   Filed Weights   05/14/16 0927  Weight: 188 lb 7.9 oz (85.5 kg)    GENERAL: Well-nourished well-developed; Alert, no distress and comfortable.   Alone.  EYES:  no pallor or icterus OROPHARYNX: no thrush or ulceration; good dentition  NECK: supple, no masses felt LYMPH:  no palpable lymphadenopathy in the cervical, axillary or inguinal regions LUNGS: clear to auscultation and  No wheeze or crackles HEART/CVS: regular rate & rhythm and no murmurs; No lower extremity edema ABDOMEN:abdomen soft, non-tender and normal bowel sounds Musculoskeletal:no cyanosis of digits and no clubbing  PSYCH: alert & oriented x 3 with fluent speech NEURO: no focal motor/sensory deficits SKIN:  no rashes or significant lesions Right and left BREAST exam [in the presence of nurse]- no unusual skin changes or dominant masses felt. Surgical  scars noted.  For chemotherapy education. No idea what he thought   LABORATORY DATA:  I have reviewed the data as listed    Component Value Date/Time   NA 137 02/09/2013 1604   K 4.0 02/09/2013 1604   CL 101 02/09/2013 1604   CO2 29 02/09/2013 1604   GLUCOSE 80 02/09/2013 1604   BUN 16 02/09/2013 1604   CREATININE 0.91 05/14/2016 0857   CREATININE 1.13 05/12/2014 1032   CALCIUM 9.5 02/09/2013 1604   PROT 7.4 05/14/2016 0857   PROT 7.1 05/12/2014 1032   ALBUMIN 3.7 05/14/2016 0857   ALBUMIN 3.3 (L) 05/12/2014 1032   AST 22 05/14/2016 0857   AST 24 05/12/2014 1032   ALT 14 05/14/2016 0857   ALT 18 05/12/2014 1032   ALKPHOS 81 05/14/2016 0857   ALKPHOS 78 05/12/2014 1032   BILITOT 0.4 05/14/2016 0857   BILITOT 0.3 05/12/2014 1032   GFRNONAA >60 05/14/2016 0857   GFRNONAA 49 (L) 05/12/2014 1032   GFRAA >60 05/14/2016 0857   GFRAA 57 (L) 05/12/2014 1032    No results found for: SPEP, UPEP  Lab Results  Component Value Date   WBC 5.7 05/14/2016   NEUTROABS 3.4 05/14/2016   HGB 11.5 (L) 05/14/2016   HCT 35.3 05/14/2016   MCV 87.2 05/14/2016   PLT 231 05/14/2016      Chemistry      Component Value Date/Time   NA 137 02/09/2013 1604   K 4.0 02/09/2013 1604   CL 101 02/09/2013 1604   CO2 29 02/09/2013 1604   BUN 16 02/09/2013 1604   CREATININE 0.91 05/14/2016 0857   CREATININE 1.13 05/12/2014 1032      Component Value Date/Time   CALCIUM 9.5 02/09/2013 1604   ALKPHOS 81 05/14/2016 0857   ALKPHOS 78 05/12/2014 1032   AST 22 05/14/2016 0857   AST 24 05/12/2014 1032   ALT 14 05/14/2016 0857   ALT 18 05/12/2014 1032   BILITOT 0.4 05/14/2016 0857   BILITOT 0.3 05/12/2014 1032       RADIOGRAPHIC STUDIES: I have personally reviewed the radiological images as listed and agreed with the findings in the report. No results found.   ASSESSMENT & PLAN:  Neoplasm of left breast, primary tumor staging category Tis: ductal carcinoma in situ (DCIS) # DCIS: s/p  lumpectomy post radiation currently on tamoxifen for 5 years. Mammogram March 2017 normal. Tolerating well no side effects. No clinical evidence of recurrence.  # Anemia- 11.5 stable~ stable over 1 year.  # Follow-up with [place] in one year; mammogram ordered through your gynecologist [Dr.Jones]  # 15 minutes face-to-face with the patient discussing the above plan of care; more than 50% of time spent on natural history; counseling and coordination.   No orders of the defined types were placed in this encounter.  All questions were answered. The patient knows to  call the clinic with any problems, questions or concerns.      Cammie Sickle, MD 05/14/2016 10:49 AM

## 2016-07-03 DIAGNOSIS — C801 Malignant (primary) neoplasm, unspecified: Secondary | ICD-10-CM | POA: Diagnosis not present

## 2016-07-03 DIAGNOSIS — E119 Type 2 diabetes mellitus without complications: Secondary | ICD-10-CM | POA: Diagnosis not present

## 2016-07-03 DIAGNOSIS — E7801 Familial hypercholesterolemia: Secondary | ICD-10-CM | POA: Diagnosis not present

## 2016-07-03 DIAGNOSIS — I10 Essential (primary) hypertension: Secondary | ICD-10-CM | POA: Diagnosis not present

## 2016-07-10 DIAGNOSIS — E119 Type 2 diabetes mellitus without complications: Secondary | ICD-10-CM | POA: Diagnosis not present

## 2016-07-10 DIAGNOSIS — I10 Essential (primary) hypertension: Secondary | ICD-10-CM | POA: Diagnosis not present

## 2016-07-10 DIAGNOSIS — E7801 Familial hypercholesterolemia: Secondary | ICD-10-CM | POA: Diagnosis not present

## 2016-07-10 DIAGNOSIS — D649 Anemia, unspecified: Secondary | ICD-10-CM | POA: Diagnosis not present

## 2016-07-30 ENCOUNTER — Ambulatory Visit: Payer: BLUE CROSS/BLUE SHIELD | Admitting: Internal Medicine

## 2016-07-30 ENCOUNTER — Other Ambulatory Visit: Payer: BLUE CROSS/BLUE SHIELD

## 2016-09-03 ENCOUNTER — Other Ambulatory Visit: Payer: Self-pay | Admitting: Obstetrics and Gynecology

## 2016-09-03 DIAGNOSIS — E669 Obesity, unspecified: Secondary | ICD-10-CM | POA: Diagnosis not present

## 2016-09-03 DIAGNOSIS — Z853 Personal history of malignant neoplasm of breast: Secondary | ICD-10-CM

## 2016-09-03 DIAGNOSIS — Z803 Family history of malignant neoplasm of breast: Secondary | ICD-10-CM | POA: Diagnosis not present

## 2016-09-03 DIAGNOSIS — Z1211 Encounter for screening for malignant neoplasm of colon: Secondary | ICD-10-CM | POA: Diagnosis not present

## 2016-09-03 DIAGNOSIS — Z79810 Long term (current) use of selective estrogen receptor modulators (SERMs): Secondary | ICD-10-CM | POA: Diagnosis not present

## 2016-09-03 DIAGNOSIS — Z01419 Encounter for gynecological examination (general) (routine) without abnormal findings: Secondary | ICD-10-CM | POA: Diagnosis not present

## 2016-12-23 ENCOUNTER — Ambulatory Visit
Admission: RE | Admit: 2016-12-23 | Discharge: 2016-12-23 | Disposition: A | Discharge status: Home / Self Care | Payer: BLUE CROSS/BLUE SHIELD | Source: Ambulatory Visit | Attending: Obstetrics and Gynecology | Admitting: Obstetrics and Gynecology

## 2016-12-23 DIAGNOSIS — Z853 Personal history of malignant neoplasm of breast: Secondary | ICD-10-CM

## 2016-12-23 DIAGNOSIS — Z9889 Other specified postprocedural states: Secondary | ICD-10-CM | POA: Insufficient documentation

## 2016-12-31 DIAGNOSIS — E119 Type 2 diabetes mellitus without complications: Secondary | ICD-10-CM | POA: Diagnosis not present

## 2016-12-31 DIAGNOSIS — D649 Anemia, unspecified: Secondary | ICD-10-CM | POA: Diagnosis not present

## 2016-12-31 DIAGNOSIS — E7801 Familial hypercholesterolemia: Secondary | ICD-10-CM | POA: Diagnosis not present

## 2016-12-31 DIAGNOSIS — I10 Essential (primary) hypertension: Secondary | ICD-10-CM | POA: Diagnosis not present

## 2017-01-08 DIAGNOSIS — I10 Essential (primary) hypertension: Secondary | ICD-10-CM | POA: Diagnosis not present

## 2017-01-08 DIAGNOSIS — M8580 Other specified disorders of bone density and structure, unspecified site: Secondary | ICD-10-CM | POA: Diagnosis not present

## 2017-01-08 DIAGNOSIS — E559 Vitamin D deficiency, unspecified: Secondary | ICD-10-CM | POA: Insufficient documentation

## 2017-01-08 DIAGNOSIS — E119 Type 2 diabetes mellitus without complications: Secondary | ICD-10-CM | POA: Diagnosis not present

## 2017-01-08 DIAGNOSIS — E7800 Pure hypercholesterolemia, unspecified: Secondary | ICD-10-CM | POA: Diagnosis not present

## 2017-01-08 DIAGNOSIS — C801 Malignant (primary) neoplasm, unspecified: Secondary | ICD-10-CM | POA: Diagnosis not present

## 2017-05-06 DIAGNOSIS — H401131 Primary open-angle glaucoma, bilateral, mild stage: Secondary | ICD-10-CM | POA: Diagnosis not present

## 2017-05-19 DIAGNOSIS — D0512 Intraductal carcinoma in situ of left breast: Secondary | ICD-10-CM | POA: Insufficient documentation

## 2017-05-19 NOTE — Progress Notes (Signed)
[facility] OFFICE PROGRESS NOTE  Patient Care Team: Leonel Ramsay, MD as PCP - General (Infectious Diseases)  Cancer Staging No matching staging information was found for the patient.   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  ER positive (75%), PR positive (25%)  [Dr.Pandit; Dr. Smith] Patient completed radiation, started Tamoxifen therapy end of June 2014 (planned for 5 years).     Ductal carcinoma in situ (DCIS) of left breast      INTERVAL HISTORY:  Katie Thompson 71 y.o.  female pleasant patient above history of DCIS of the left breast status post lumpectomy followed by radiation currently on tamoxifen is here for follow-up.  Patient denies any lumps or bumps. She has intermittent hot flashes. Denies any blood clots. Denies any vaginal bleeding. She continues to follow up with her gynecologist Dr. Ronnald Ramp on a yearly basis.  REVIEW OF SYSTEMS:  A complete 10 point review of system is done which is negative except mentioned above/history of present illness.   PAST MEDICAL HISTORY :  Past Medical History:  Diagnosis Date  . Breast cancer ([place]) 2014   LT with radiation  . Diverticulosis feb. 2015   from colonoscopy  . GERD (gastroesophageal reflux disease)   . Hyperlipidemia   . Hypertension     PAST SURGICAL HISTORY :   Past Surgical History:  Procedure Laterality Date  . APPENDECTOMY    . BREAST BIOPSY Left 2016   core  . BREAST LUMPECTOMY Left 2014   with radiation  . HERNIA REPAIR    . NASAL SINUS SURGERY      FAMILY HISTORY :   Family History  Problem Relation Age of Onset  . Ovarian cancer Mother   . Hypertension Mother   . Diabetes Paternal Grandmother   . Hypertension Paternal Grandmother   . Breast cancer Paternal Aunt 99    SOCIAL HISTORY:   Social History  Substance Use Topics  . Smoking status: Never Smoker  .  Smokeless tobacco: Never Used  . Alcohol use No    ALLERGIES:  is allergic to codeine and gatifloxacin.  MEDICATIONS:  Current Outpatient Prescriptions  Medication Sig Dispense Refill  . aspirin EC 81 MG tablet Take by mouth.    Marland Kitchen atorvastatin (LIPITOR) 10 MG tablet Take 10 mg by mouth daily at 6 PM.     . calcium carbonate (OS-CAL - DOSED IN MG OF ELEMENTAL CALCIUM) 1250 (500 Ca) MG tablet Take 1 tablet by mouth daily.     Marland Kitchen losartan-hydrochlorothiazide (HYZAAR) 50-12.5 MG tablet Take 1 tablet by mouth daily.     Earney Navy Bicarbonate (ZEGERID) 20-1100 MG CAPS capsule Take 1 capsule by mouth daily before breakfast.     . tamoxifen (NOLVADEX) 20 MG tablet Take 1 tablet (20 mg total) by mouth daily. 30 tablet 12  . verapamil (CALAN-SR) 240 MG CR tablet Take by mouth.    . vitamin B-12 (CYANOCOBALAMIN) 500 MCG tablet Take 500 mcg by mouth daily.     No current facility-administered medications for this visit.     PHYSICAL EXAMINATION: ECOG PERFORMANCE STATUS: 0 - Asymptomatic  BP (!) 148/67 (Patient Position: Sitting)   Pulse 64   Temp 98.5 F (36.9 C) (Oral)   Resp 20   Ht 5\' 3"  (1.6 m)   Wt 191 lb 12.8 oz (87 kg)   BMI 33.98 kg/m  Filed Weights   05/20/17 1036  Weight: 191 lb 12.8 oz (87 kg)    GENERAL: Well-nourished well-developed; Alert, no distress and comfortable.   Alone.  EYES: no pallor or icterus OROPHARYNX: no thrush or ulceration; good dentition  NECK: supple, no masses felt LYMPH:  no palpable lymphadenopathy in the cervical, axillary or inguinal regions LUNGS: clear to auscultation and  No wheeze or crackles HEART/CVS: regular rate & rhythm and no murmurs; No lower extremity edema ABDOMEN:abdomen soft, non-tender and normal bowel sounds Musculoskeletal:no cyanosis of digits and no clubbing  PSYCH: alert & oriented x 3 with fluent speech NEURO: no focal motor/sensory deficits SKIN:  no rashes or significant lesions Right and left BREAST exam  [in the presence of nurse]- no unusual skin changes or dominant masses felt. Surgical scars noted.   LABORATORY DATA:  I have reviewed the data as listed    Component Value Date/Time   NA 138 05/20/2017 1010   NA 137 02/09/2013 1604   K 3.9 05/20/2017 1010   K 4.0 02/09/2013 1604   CL 105 05/20/2017 1010   CL 101 02/09/2013 1604   CO2 27 05/20/2017 1010   CO2 29 02/09/2013 1604   GLUCOSE 117 (H) 05/20/2017 1010   GLUCOSE 80 02/09/2013 1604   BUN 23 (H) 05/20/2017 1010   BUN 16 02/09/2013 1604   CREATININE 1.04 (H) 05/20/2017 1010   CREATININE 1.13 05/12/2014 1032   CALCIUM 9.2 05/20/2017 1010   CALCIUM 9.5 02/09/2013 1604   PROT 7.1 05/20/2017 1010   PROT 7.1 05/12/2014 1032   ALBUMIN 3.8 05/20/2017 1010   ALBUMIN 3.3 (L) 05/12/2014 1032   AST 21 05/20/2017 1010   AST 24 05/12/2014 1032   ALT 12 (L) 05/20/2017 1010   ALT 18 05/12/2014 1032   ALKPHOS 65 05/20/2017 1010   ALKPHOS 78 05/12/2014 1032   BILITOT 0.8 05/20/2017 1010   BILITOT 0.3 05/12/2014 1032   GFRNONAA 52 (L) 05/20/2017 1010   GFRNONAA 49 (L) 05/12/2014 1032   GFRAA >60 05/20/2017 1010   GFRAA 57 (L) 05/12/2014 1032    No results found for: SPEP, UPEP  Lab Results  Component Value Date   WBC 6.4 05/20/2017   NEUTROABS 3.7 05/20/2017   HGB 11.9 (L) 05/20/2017   HCT 36.3 05/20/2017   MCV 86.7 05/20/2017   PLT 247 05/20/2017      Chemistry      Component Value Date/Time   NA 138 05/20/2017 1010   NA 137 02/09/2013 1604   K 3.9 05/20/2017 1010   K 4.0 02/09/2013 1604   CL 105 05/20/2017 1010   CL 101 02/09/2013 1604   CO2 27 05/20/2017 1010   CO2 29 02/09/2013 1604   BUN 23 (H) 05/20/2017 1010   BUN 16 02/09/2013 1604   CREATININE 1.04 (H) 05/20/2017 1010   CREATININE 1.13 05/12/2014 1032      Component Value Date/Time   CALCIUM 9.2 05/20/2017 1010   CALCIUM 9.5 02/09/2013 1604   ALKPHOS 65 05/20/2017 1010   ALKPHOS 78 05/12/2014 1032   AST 21 05/20/2017 1010   AST 24 05/12/2014 1032    ALT 12 (L) 05/20/2017 1010   ALT 18 05/12/2014 1032   BILITOT 0.8 05/20/2017 1010   BILITOT 0.3 05/12/2014 1032       RADIOGRAPHIC STUDIES: I have personally reviewed the radiological images as listed and agreed with the findings in the report. No results found.   ASSESSMENT & PLAN:  Neoplasm of left breast,  primary tumor staging category Tis: ductal carcinoma in situ (DCIS) # DCIS: s/p lumpectomy post radiation currently on tamoxifen for 5 years. Mammogram March 2017 normal. Tolerating well no side effects. No clinical evidence of recurrence.  # Anemia- 11.5 stable~ stable over 1 year.  # Follow-up with [place] in one year; mammogram ordered through your gynecologist [Dr.Jones]  # 15 minutes face-to-face with the patient discussing the above plan of care; more than 50% of time spent on natural history; counseling and coordination.  Ductal carcinoma in situ (DCIS) of left breast # DCIS: s/p lumpectomy post radiation currently on tamoxifen for 5 years [will finish summer of 2019 2018]. Mammogram March 2018 normal. Tolerating well no side effects. No clinical evidence of recurrence.  # Anemia- 11.9 stable~ stable over 2 years. Will check ferritin iron studies today.    # Follow-up with [place] in one year; mammogram ordered through gynecologist [Dr.Jones].    Orders Placed This Encounter  Procedures  . CBC with Differential/Platelet    Standing Status:   Future    Number of Occurrences:   1    Standing Expiration Date:   05/20/2018  . Comprehensive metabolic panel    Standing Status:   Future    Number of Occurrences:   1    Standing Expiration Date:   05/20/2018  . Iron and TIBC    Standing Status:   Future    Number of Occurrences:   1    Standing Expiration Date:   05/20/2018  . Ferritin    Standing Status:   Future    Number of Occurrences:   1    Standing Expiration Date:   05/20/2018  . CBC with Differential/Platelet    Standing Status:   Future    Standing Expiration Date:    05/20/2018  . Basic metabolic panel    Standing Status:   Future    Standing Expiration Date:   05/20/2018   All questions were answered. The patient knows to call the clinic with any problems, questions or concerns.      Cammie Sickle, MD 05/20/2017 11:12 AM

## 2017-05-19 NOTE — Assessment & Plan Note (Signed)
#   DCIS: s/p lumpectomy post radiation currently on tamoxifen for 5 years. Mammogram March 2017 normal. Tolerating well no side effects. No clinical evidence of recurrence.  # Anemia- 11.5 stable~ stable over 1 year.  # Follow-up with [place] in one year; mammogram ordered through your gynecologist [Dr.Jones]  # 15 minutes face-to-face with the patient discussing the above plan of care; more than 50% of time spent on natural history; counseling and coordination.

## 2017-05-20 ENCOUNTER — Inpatient Hospital Stay: Payer: BLUE CROSS/BLUE SHIELD | Attending: Internal Medicine

## 2017-05-20 ENCOUNTER — Inpatient Hospital Stay (HOSPITAL_BASED_OUTPATIENT_CLINIC_OR_DEPARTMENT_OTHER): Payer: BLUE CROSS/BLUE SHIELD | Admitting: Internal Medicine

## 2017-05-20 DIAGNOSIS — K219 Gastro-esophageal reflux disease without esophagitis: Secondary | ICD-10-CM | POA: Insufficient documentation

## 2017-05-20 DIAGNOSIS — Z7982 Long term (current) use of aspirin: Secondary | ICD-10-CM | POA: Diagnosis not present

## 2017-05-20 DIAGNOSIS — I10 Essential (primary) hypertension: Secondary | ICD-10-CM | POA: Insufficient documentation

## 2017-05-20 DIAGNOSIS — Z170 Estrogen receptor positive status [ER+]: Secondary | ICD-10-CM | POA: Diagnosis not present

## 2017-05-20 DIAGNOSIS — D649 Anemia, unspecified: Secondary | ICD-10-CM

## 2017-05-20 DIAGNOSIS — D0512 Intraductal carcinoma in situ of left breast: Secondary | ICD-10-CM

## 2017-05-20 DIAGNOSIS — Z79810 Long term (current) use of selective estrogen receptor modulators (SERMs): Secondary | ICD-10-CM | POA: Insufficient documentation

## 2017-05-20 DIAGNOSIS — E785 Hyperlipidemia, unspecified: Secondary | ICD-10-CM | POA: Diagnosis not present

## 2017-05-20 DIAGNOSIS — Z923 Personal history of irradiation: Secondary | ICD-10-CM | POA: Insufficient documentation

## 2017-05-20 LAB — IRON AND TIBC
IRON: 62 ug/dL (ref 28–170)
SATURATION RATIOS: 19 % (ref 10.4–31.8)
TIBC: 327 ug/dL (ref 250–450)
UIBC: 265 ug/dL

## 2017-05-20 LAB — CBC WITH DIFFERENTIAL/PLATELET
Basophils Absolute: 0 10*3/uL (ref 0–0.1)
Basophils Relative: 0 %
Eosinophils Absolute: 0.1 10*3/uL (ref 0–0.7)
Eosinophils Relative: 2 %
HEMATOCRIT: 36.3 % (ref 35.0–47.0)
HEMOGLOBIN: 11.9 g/dL — AB (ref 12.0–16.0)
LYMPHS ABS: 2.1 10*3/uL (ref 1.0–3.6)
Lymphocytes Relative: 33 %
MCH: 28.4 pg (ref 26.0–34.0)
MCHC: 32.7 g/dL (ref 32.0–36.0)
MCV: 86.7 fL (ref 80.0–100.0)
MONO ABS: 0.5 10*3/uL (ref 0.2–0.9)
MONOS PCT: 8 %
NEUTROS ABS: 3.7 10*3/uL (ref 1.4–6.5)
Neutrophils Relative %: 57 %
Platelets: 247 10*3/uL (ref 150–440)
RBC: 4.19 MIL/uL (ref 3.80–5.20)
RDW: 15.4 % — AB (ref 11.5–14.5)
WBC: 6.4 10*3/uL (ref 3.6–11.0)

## 2017-05-20 LAB — COMPREHENSIVE METABOLIC PANEL
ALK PHOS: 65 U/L (ref 38–126)
ALT: 12 U/L — ABNORMAL LOW (ref 14–54)
AST: 21 U/L (ref 15–41)
Albumin: 3.8 g/dL (ref 3.5–5.0)
Anion gap: 6 (ref 5–15)
BILIRUBIN TOTAL: 0.8 mg/dL (ref 0.3–1.2)
BUN: 23 mg/dL — ABNORMAL HIGH (ref 6–20)
CALCIUM: 9.2 mg/dL (ref 8.9–10.3)
CO2: 27 mmol/L (ref 22–32)
CREATININE: 1.04 mg/dL — AB (ref 0.44–1.00)
Chloride: 105 mmol/L (ref 101–111)
GFR calc Af Amer: >60 mL/min (ref >60)
GFR, EST NON AFRICAN AMERICAN: 52 mL/min — AB (ref >60)
Glucose, Bld: 117 mg/dL — ABNORMAL HIGH (ref 65–99)
POTASSIUM: 3.9 mmol/L (ref 3.5–5.1)
Sodium: 138 mmol/L (ref 135–145)
TOTAL PROTEIN: 7.1 g/dL (ref 6.5–8.1)

## 2017-05-20 LAB — FERRITIN: Ferritin: 121 ng/mL (ref 11–307)

## 2017-05-20 MED ORDER — TAMOXIFEN CITRATE 20 MG PO TABS: 20.0000 mg | ORAL_TABLET | Freq: Every day | ORAL | 12 refills | Status: DC

## 2017-05-20 NOTE — Progress Notes (Signed)
Patient here for breast cancer followup. Patient performs her self breast exams at home. She is up to date on her mammograms.

## 2017-05-20 NOTE — Assessment & Plan Note (Signed)
#   DCIS: s/p lumpectomy post radiation currently on tamoxifen for 5 years [will finish summer of 2019 2018]. Mammogram March 2018 normal. Tolerating well no side effects. No clinical evidence of recurrence.  # Anemia- 11.9 stable~ stable over 2 years. Will check ferritin iron studies today.    # Follow-up with [place] in one year; mammogram ordered through gynecologist [Dr.Jones].

## 2017-05-27 DIAGNOSIS — H401131 Primary open-angle glaucoma, bilateral, mild stage: Secondary | ICD-10-CM | POA: Diagnosis not present

## 2017-06-24 ENCOUNTER — Inpatient Hospital Stay: Payer: BLUE CROSS/BLUE SHIELD | Attending: Internal Medicine | Admitting: Internal Medicine

## 2017-06-24 DIAGNOSIS — N6452 Nipple discharge: Secondary | ICD-10-CM | POA: Diagnosis not present

## 2017-06-24 DIAGNOSIS — Z8041 Family history of malignant neoplasm of ovary: Secondary | ICD-10-CM | POA: Insufficient documentation

## 2017-06-24 DIAGNOSIS — D649 Anemia, unspecified: Secondary | ICD-10-CM | POA: Insufficient documentation

## 2017-06-24 DIAGNOSIS — Z8719 Personal history of other diseases of the digestive system: Secondary | ICD-10-CM | POA: Diagnosis not present

## 2017-06-24 DIAGNOSIS — I10 Essential (primary) hypertension: Secondary | ICD-10-CM | POA: Insufficient documentation

## 2017-06-24 DIAGNOSIS — K219 Gastro-esophageal reflux disease without esophagitis: Secondary | ICD-10-CM

## 2017-06-24 DIAGNOSIS — Z803 Family history of malignant neoplasm of breast: Secondary | ICD-10-CM | POA: Diagnosis not present

## 2017-06-24 DIAGNOSIS — Z79899 Other long term (current) drug therapy: Secondary | ICD-10-CM | POA: Diagnosis not present

## 2017-06-24 DIAGNOSIS — D0512 Intraductal carcinoma in situ of left breast: Secondary | ICD-10-CM

## 2017-06-24 DIAGNOSIS — Z7982 Long term (current) use of aspirin: Secondary | ICD-10-CM | POA: Insufficient documentation

## 2017-06-24 DIAGNOSIS — E785 Hyperlipidemia, unspecified: Secondary | ICD-10-CM | POA: Insufficient documentation

## 2017-06-24 DIAGNOSIS — Z170 Estrogen receptor positive status [ER+]: Secondary | ICD-10-CM | POA: Diagnosis not present

## 2017-06-24 DIAGNOSIS — Z79810 Long term (current) use of selective estrogen receptor modulators (SERMs): Secondary | ICD-10-CM | POA: Diagnosis not present

## 2017-06-24 DIAGNOSIS — Z923 Personal history of irradiation: Secondary | ICD-10-CM | POA: Insufficient documentation

## 2017-06-24 NOTE — Progress Notes (Signed)
Patient states that has noticed a bloody, purulent discharge from her left nipple. Patient also states that her left nipple is very sore, and tender to touch (pain scale 8/10). Patient also states it can be itchy at times. Patient states that this has been going on for about 3 weeks - and it seems to be getting worse.

## 2017-06-24 NOTE — Progress Notes (Signed)
[facility] OFFICE PROGRESS NOTE  Patient Care Team: Leonel Ramsay, MD as PCP - General (Infectious Diseases)  Cancer Staging No matching staging information was found for the patient.   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  ER positive (75%), PR positive (25%)  [Dr.Pandit; Dr. Smith] Patient completed radiation, started Tamoxifen therapy end of June 2014 (planned for 5 years).     Ductal carcinoma in situ (DCIS) of left breast      INTERVAL HISTORY:  Katie Thompson 71 y.o.  female pleasant patient above history of DCIS of the left breast status post lumpectomy followed by radiation currently on tamoxifen is here To be evaluated for left nipple discharge/tenderness.  Patient noted to have bloody discharge from the left nipple; tenderness with minimal trauma. She noted dyspnea symptoms approximately 3 weeks ago. She also noted  Around the nipple.   Patient denies any lumps or bumps. She has intermittent hot flashes. Denies any blood clots. Denies any vaginal bleeding.   REVIEW OF SYSTEMS:  A complete 10 point review of system is done which is negative except mentioned above/history of present illness.   PAST MEDICAL HISTORY :  Past Medical History:  Diagnosis Date  . Breast cancer ([place]) 2014   LT with radiation  . Diverticulosis feb. 2015   from colonoscopy  . GERD (gastroesophageal reflux disease)   . Hyperlipidemia   . Hypertension     PAST SURGICAL HISTORY :   Past Surgical History:  Procedure Laterality Date  . APPENDECTOMY    . BREAST BIOPSY Left 2016   core  . BREAST LUMPECTOMY Left 2014   with radiation  . HERNIA REPAIR    . NASAL SINUS SURGERY      FAMILY HISTORY :   Family History  Problem Relation Age of Onset  . Ovarian cancer Mother   . Hypertension Mother   . Diabetes Paternal Grandmother   . Hypertension Paternal  Grandmother   . Breast cancer Paternal Aunt 65    SOCIAL HISTORY:   Social History  Substance Use Topics  . Smoking status: Never Smoker  . Smokeless tobacco: Never Used  . Alcohol use No    ALLERGIES:  is allergic to codeine and gatifloxacin.  MEDICATIONS:  Current Outpatient Prescriptions  Medication Sig Dispense Refill  . aspirin EC 81 MG tablet Take by mouth.    Marland Kitchen atorvastatin (LIPITOR) 10 MG tablet Take 10 mg by mouth daily at 6 PM.     . calcium carbonate (OS-CAL - DOSED IN MG OF ELEMENTAL CALCIUM) 1250 (500 Ca) MG tablet Take 1 tablet by mouth daily.     Marland Kitchen losartan-hydrochlorothiazide (HYZAAR) 50-12.5 MG tablet Take 1 tablet by mouth daily.     Earney Navy Bicarbonate (ZEGERID) 20-1100 MG CAPS capsule Take 1 capsule by mouth daily before breakfast.     . tamoxifen (NOLVADEX) 20 MG tablet Take 1 tablet (20 mg total) by mouth daily. 30 tablet 12  . verapamil (CALAN-SR) 240 MG CR tablet Take by mouth.    . vitamin B-12 (CYANOCOBALAMIN) 500 MCG tablet Take 500 mcg by mouth daily.     No current facility-administered medications for this visit.     PHYSICAL EXAMINATION: ECOG PERFORMANCE STATUS: 0 - Asymptomatic  BP (!) 163/84 (BP Location: Right Leg, Patient Position: Sitting)   Pulse 76   Temp 98.2 F (36.8 C) (  Tympanic)   Resp 16   Wt 190 lb 14.7 oz (86.6 kg)   BMI 33.82 kg/m   Filed Weights   06/24/17 0928  Weight: 190 lb 14.7 oz (86.6 kg)    GENERAL: Well-nourished well-developed; Alert, no distress and comfortable.   Alone.  EYES: no pallor or icterus OROPHARYNX: no thrush or ulceration; good dentition  NECK: supple, no masses felt LYMPH:  no palpable lymphadenopathy in the cervical, axillary or inguinal regions LUNGS: clear to auscultation and  No wheeze or crackles HEART/CVS: regular rate & rhythm and no murmurs; No lower extremity edema ABDOMEN:abdomen soft, non-tender and normal bowel sounds Musculoskeletal:no cyanosis of digits and no clubbing   PSYCH: alert & oriented x 3 with fluent speech NEURO: no focal motor/sensory deficits SKIN:  no rashes or significant lesions Right and left BREAST exam [in the presence of nurse]- right breast normal appearing-no lumps noted. Left breast around the nipple-no inversion of the nipple; however mild erythema noted around the nipple.no discrete mass f felt.  LABORATORY DATA:  I have reviewed the data as listed    Component Value Date/Time   NA 138 05/20/2017 1010   NA 137 02/09/2013 1604   K 3.9 05/20/2017 1010   K 4.0 02/09/2013 1604   CL 105 05/20/2017 1010   CL 101 02/09/2013 1604   CO2 27 05/20/2017 1010   CO2 29 02/09/2013 1604   GLUCOSE 117 (H) 05/20/2017 1010   GLUCOSE 80 02/09/2013 1604   BUN 23 (H) 05/20/2017 1010   BUN 16 02/09/2013 1604   CREATININE 1.04 (H) 05/20/2017 1010   CREATININE 1.13 05/12/2014 1032   CALCIUM 9.2 05/20/2017 1010   CALCIUM 9.5 02/09/2013 1604   PROT 7.1 05/20/2017 1010   PROT 7.1 05/12/2014 1032   ALBUMIN 3.8 05/20/2017 1010   ALBUMIN 3.3 (L) 05/12/2014 1032   AST 21 05/20/2017 1010   AST 24 05/12/2014 1032   ALT 12 (L) 05/20/2017 1010   ALT 18 05/12/2014 1032   ALKPHOS 65 05/20/2017 1010   ALKPHOS 78 05/12/2014 1032   BILITOT 0.8 05/20/2017 1010   BILITOT 0.3 05/12/2014 1032   GFRNONAA 52 (L) 05/20/2017 1010   GFRNONAA 49 (L) 05/12/2014 1032   GFRAA >60 05/20/2017 1010   GFRAA 57 (L) 05/12/2014 1032    No results found for: SPEP, UPEP  Lab Results  Component Value Date   WBC 6.4 05/20/2017   NEUTROABS 3.7 05/20/2017   HGB 11.9 (L) 05/20/2017   HCT 36.3 05/20/2017   MCV 86.7 05/20/2017   PLT 247 05/20/2017      Chemistry      Component Value Date/Time   NA 138 05/20/2017 1010   NA 137 02/09/2013 1604   K 3.9 05/20/2017 1010   K 4.0 02/09/2013 1604   CL 105 05/20/2017 1010   CL 101 02/09/2013 1604   CO2 27 05/20/2017 1010   CO2 29 02/09/2013 1604   BUN 23 (H) 05/20/2017 1010   BUN 16 02/09/2013 1604   CREATININE 1.04  (H) 05/20/2017 1010   CREATININE 1.13 05/12/2014 1032      Component Value Date/Time   CALCIUM 9.2 05/20/2017 1010   CALCIUM 9.5 02/09/2013 1604   ALKPHOS 65 05/20/2017 1010   ALKPHOS 78 05/12/2014 1032   AST 21 05/20/2017 1010   AST 24 05/12/2014 1032   ALT 12 (L) 05/20/2017 1010   ALT 18 05/12/2014 1032   BILITOT 0.8 05/20/2017 1010   BILITOT 0.3 05/12/2014 1032  RADIOGRAPHIC STUDIES: I have personally reviewed the radiological images as listed and agreed with the findings in the report. No results found.   ASSESSMENT & PLAN:  Ductal carcinoma in situ (DCIS) of left breast # Left breast/nipple redness/swelling/discharge-question inflammatory breast cancer versus others. Ordered mammogram /ultrasound. Recommmend follow up with Dr.Smith.   # DCIS: s/p lumpectomy post radiation currently on tamoxifen for 5 years.  # Anemia- 11.9 stable~ stable over 2 years. No iron deficiency  # Follow-up with [place] based on mammo/US work up/Dr.Smith.   # I spoke to Dr. Tamala Julian regarding my to concerns. He kindly agrees to evaluate the patient soon.   Cc; Anne/Sheena.    Orders Placed This Encounter  Procedures  . US Breast Limited Uni Left Inc Axilla    Standing Status:   Future    Standing Expiration Date:   08/24/2018    Order Specific Question:   Reason for Exam (SYMPTOM  OR DIAGNOSIS REQUIRED)    Answer:   Nipple discharge of left breast    Order Specific Question:   Preferred imaging location?    Answer:   [facility]  . MM DIAG BREAST TOMO UNI LEFT    Standing Status:   Future    Standing Expiration Date:   06/24/2018    Order Specific Question:   Reason for Exam (SYMPTOM  OR DIAGNOSIS REQUIRED)    Answer:   Nipple discharge of left breast    Order Specific Question:   Preferred imaging location?    Answer:   [facility]  . Ambulatory referral to General Surgery    Referral Priority:   Routine    Referral Type:   Surgical    Referral Reason:   Specialty  Services Required    Referred to Provider:   Leonie Green, MD    Requested Specialty:   Surgery    Number of Visits Requested:   1   All questions were answered. The patient knows to call the clinic with any problems, questions or concerns.      Cammie Sickle, MD 06/24/2017 1:10 PM

## 2017-06-24 NOTE — Assessment & Plan Note (Addendum)
#   Left breast/nipple redness/swelling/discharge-question inflammatory breast cancer versus others. Ordered mammogram /ultrasound. Recommmend follow up with Dr.Smith.   # DCIS: s/p lumpectomy post radiation currently on tamoxifen for 5 years.  # Anemia- 11.9 stable~ stable over 2 years. No iron deficiency  # Follow-up with [place] based on mammo/US work up/Dr.Smith.   # I spoke to Dr. Tamala Julian regarding my to concerns. He kindly agrees to evaluate the patient soon.   Cc; Anne/Sheena.

## 2017-07-01 ENCOUNTER — Ambulatory Visit
Admission: RE | Admit: 2017-07-01 | Discharge: 2017-07-01 | Disposition: A | Discharge status: Home / Self Care | Payer: BLUE CROSS/BLUE SHIELD | Source: Ambulatory Visit | Attending: Internal Medicine | Admitting: Internal Medicine

## 2017-07-01 DIAGNOSIS — R928 Other abnormal and inconclusive findings on diagnostic imaging of breast: Secondary | ICD-10-CM | POA: Diagnosis not present

## 2017-07-01 DIAGNOSIS — E559 Vitamin D deficiency, unspecified: Secondary | ICD-10-CM | POA: Diagnosis not present

## 2017-07-01 DIAGNOSIS — N6489 Other specified disorders of breast: Secondary | ICD-10-CM | POA: Diagnosis not present

## 2017-07-01 DIAGNOSIS — D0512 Intraductal carcinoma in situ of left breast: Secondary | ICD-10-CM | POA: Insufficient documentation

## 2017-07-01 DIAGNOSIS — E7800 Pure hypercholesterolemia, unspecified: Secondary | ICD-10-CM | POA: Diagnosis not present

## 2017-07-01 DIAGNOSIS — N649 Disorder of breast, unspecified: Secondary | ICD-10-CM | POA: Insufficient documentation

## 2017-07-14 DIAGNOSIS — I10 Essential (primary) hypertension: Secondary | ICD-10-CM | POA: Diagnosis not present

## 2017-07-14 DIAGNOSIS — E119 Type 2 diabetes mellitus without complications: Secondary | ICD-10-CM | POA: Diagnosis not present

## 2017-07-14 DIAGNOSIS — E559 Vitamin D deficiency, unspecified: Secondary | ICD-10-CM | POA: Diagnosis not present

## 2017-07-14 DIAGNOSIS — E7800 Pure hypercholesterolemia, unspecified: Secondary | ICD-10-CM | POA: Diagnosis not present

## 2017-07-14 DIAGNOSIS — E7801 Familial hypercholesterolemia: Secondary | ICD-10-CM | POA: Diagnosis not present

## 2017-07-18 ENCOUNTER — Encounter
Admission: RE | Admit: 2017-07-18 | Discharge: 2017-07-18 | Disposition: A | Discharge status: Home / Self Care | Payer: BLUE CROSS/BLUE SHIELD | Source: Ambulatory Visit | Attending: Surgery | Admitting: Surgery

## 2017-07-18 DIAGNOSIS — K219 Gastro-esophageal reflux disease without esophagitis: Secondary | ICD-10-CM | POA: Insufficient documentation

## 2017-07-18 DIAGNOSIS — Z853 Personal history of malignant neoplasm of breast: Secondary | ICD-10-CM | POA: Insufficient documentation

## 2017-07-18 DIAGNOSIS — I10 Essential (primary) hypertension: Secondary | ICD-10-CM | POA: Diagnosis not present

## 2017-07-18 DIAGNOSIS — Z01810 Encounter for preprocedural cardiovascular examination: Secondary | ICD-10-CM | POA: Diagnosis present

## 2017-07-18 DIAGNOSIS — Z01812 Encounter for preprocedural laboratory examination: Secondary | ICD-10-CM | POA: Insufficient documentation

## 2017-07-18 DIAGNOSIS — E785 Hyperlipidemia, unspecified: Secondary | ICD-10-CM | POA: Diagnosis not present

## 2017-07-18 LAB — CBC
HEMATOCRIT: 34.8 % — AB (ref 35.0–47.0)
HEMOGLOBIN: 11.6 g/dL — AB (ref 12.0–16.0)
MCH: 28.5 pg (ref 26.0–34.0)
MCHC: 33.2 g/dL (ref 32.0–36.0)
MCV: 85.7 fL (ref 80.0–100.0)
Platelets: 254 10*3/uL (ref 150–440)
RBC: 4.06 MIL/uL (ref 3.80–5.20)
RDW: 15.7 % — ABNORMAL HIGH (ref 11.5–14.5)
WBC: 7 10*3/uL (ref 3.6–11.0)

## 2017-07-18 LAB — COMPREHENSIVE METABOLIC PANEL
ALK PHOS: 67 U/L (ref 38–126)
ALT: 13 U/L — AB (ref 14–54)
ANION GAP: 10 (ref 5–15)
AST: 20 U/L (ref 15–41)
Albumin: 4 g/dL (ref 3.5–5.0)
BILIRUBIN TOTAL: 0.7 mg/dL (ref 0.3–1.2)
BUN: 19 mg/dL (ref 6–20)
CALCIUM: 9.1 mg/dL (ref 8.9–10.3)
CO2: 25 mmol/L (ref 22–32)
CREATININE: 0.88 mg/dL (ref 0.44–1.00)
Chloride: 106 mmol/L (ref 101–111)
Glucose, Bld: 71 mg/dL (ref 65–99)
Potassium: 3 mmol/L — ABNORMAL LOW (ref 3.5–5.1)
SODIUM: 141 mmol/L (ref 135–145)
TOTAL PROTEIN: 7.7 g/dL (ref 6.5–8.1)

## 2017-07-18 NOTE — Patient Instructions (Signed)
Your procedure is scheduled on: 07/29/17 Tues Report to Same Day Surgery 2nd floor medical mall [facility] Entrance-take elevator on left to 2nd floor.  Check in with surgery information desk.) To find out your arrival time please call [phone] between 1PM - 3PM on 07/28/17 Mon  Remember: Instructions that are not followed completely may result in serious medical risk, up to and including death, or upon the discretion of your surgeon and anesthesiologist your surgery may need to be rescheduled.    _x___ 1. Do not eat food after midnight the night before your procedure. You may drink clear liquids up to 2 hours before you are scheduled to arrive at the hospital for your procedure.  Do not drink clear liquids within 2 hours of your scheduled arrival to the hospital.  Clear liquids include  --Water or Apple juice without pulp  --Clear carbohydrate beverage such as ClearFast or Gatorade  --Black Coffee or Clear Tea (No milk, no creamers, do not add anything to                  the coffee or Tea Type 1 and type 2 diabetics should only drink water.  No gum chewing or hard candies.     __x__ 2. No Alcohol for 24 hours before or after surgery.   __x__3. No Smoking for 24 prior to surgery.   ____  4. Bring all medications with you on the day of surgery if instructed.    __x__ 5. Notify your doctor if there is any change in your medical condition     (cold, fever, infections).     Do not wear jewelry, make-up, hairpins, clips or nail polish.  Do not wear lotions, powders, or perfumes. You may wear deodorant.  Do not shave 48 hours prior to surgery. Men may shave face and neck.  Do not bring valuables to the hospital.    [facility] is not responsible for any belongings or valuables.               Contacts, dentures or bridgework may not be worn into surgery.  Leave your suitcase in the car. After surgery it may be brought to your room.  For patients admitted to the hospital,  discharge time is determined by your                       treatment team.   Patients discharged the day of surgery will not be allowed to drive home.  You will need someone to drive you home and stay with you the night of your procedure.    Please read over the following fact sheets that you were given:   [facility] Preparing for Surgery and or MRSA Information   _x___ Take anti-hypertensive listed below, cardiac, seizure, asthma,     anti-reflux and psychiatric medicines. These include:  1. atorvastatin (LIPITOR) 10 MG tablet  2.Omeprazole-Sodium Bicarbonate (ZEGERID OTC) 20-1100 MG CAPS capsule  3.verapamil (CALAN-SR) 240 MG CR tablet  4.  5.  6.  ____Fleets enema or Magnesium Citrate as directed.   _x___ Use CHG Soap or sage wipes as directed on instruction sheet   ____ Use inhalers on the day of surgery and bring to hospital day of surgery  ____ Stop Metformin and Janumet 2 days prior to surgery.    ____ Take 1/2 of usual insulin dose the night before surgery and none on the morning     surgery.   _x___  Follow recommendations from Cardiologist, Pulmonologist or PCP regarding          stopping Aspirin, Coumadin, Plavix ,Eliquis, Effient, or Pradaxa, and Pletal.  X____Stop Anti-inflammatories such as Advil, Aleve, Ibuprofen, Motrin, Naproxen, Naprosyn, Goodies powders or aspirin products. OK to take Tylenol and                          Celebrex.   _x___ Stop supplements until after surgery.  But may continue Vitamin D, Vitamin B,       and multivitamin.   ____ Bring C-Pap to the hospital.

## 2017-07-29 ENCOUNTER — Ambulatory Visit
Admission: RE | Admit: 2017-07-29 | Discharge: 2017-07-29 | Disposition: A | Discharge status: Home / Self Care | Payer: BLUE CROSS/BLUE SHIELD | Source: Ambulatory Visit | Attending: Surgery | Admitting: Surgery

## 2017-07-29 ENCOUNTER — Encounter
Admission: RE | Disposition: A | Discharge status: Home / Self Care | Payer: Self-pay | Source: Ambulatory Visit | Attending: Surgery

## 2017-07-29 ENCOUNTER — Ambulatory Visit: Payer: BLUE CROSS/BLUE SHIELD | Admitting: Anesthesiology

## 2017-07-29 ENCOUNTER — Encounter: Payer: Self-pay | Admitting: *Deleted

## 2017-07-29 DIAGNOSIS — Z79899 Other long term (current) drug therapy: Secondary | ICD-10-CM | POA: Insufficient documentation

## 2017-07-29 DIAGNOSIS — I10 Essential (primary) hypertension: Secondary | ICD-10-CM | POA: Insufficient documentation

## 2017-07-29 DIAGNOSIS — N6320 Unspecified lump in the left breast, unspecified quadrant: Secondary | ICD-10-CM | POA: Diagnosis not present

## 2017-07-29 DIAGNOSIS — K219 Gastro-esophageal reflux disease without esophagitis: Secondary | ICD-10-CM | POA: Diagnosis not present

## 2017-07-29 DIAGNOSIS — D242 Benign neoplasm of left breast: Secondary | ICD-10-CM | POA: Insufficient documentation

## 2017-07-29 DIAGNOSIS — Z86000 Personal history of in-situ neoplasm of breast: Secondary | ICD-10-CM | POA: Diagnosis not present

## 2017-07-29 DIAGNOSIS — N63 Unspecified lump in unspecified breast: Secondary | ICD-10-CM | POA: Diagnosis not present

## 2017-07-29 HISTORY — PX: BREAST EXCISIONAL BIOPSY: SUR124

## 2017-07-29 HISTORY — PX: EXCISION OF BREAST LESION: SHX6676

## 2017-07-29 LAB — POCT I-STAT 4, (NA,K, GLUC, HGB,HCT)
GLUCOSE: 91 mg/dL (ref 65–99)
HCT: 36 % (ref 36.0–46.0)
Hemoglobin: 12.2 g/dL (ref 12.0–15.0)
Potassium: 4.9 mmol/L (ref 3.5–5.1)
Sodium: 139 mmol/L (ref 135–145)

## 2017-07-29 SURGERY — EXCISION OF BREAST LESION
Anesthesia: General | Laterality: Left | Wound class: Clean

## 2017-07-29 MED ORDER — MIDAZOLAM HCL 2 MG/2ML IJ SOLN
INTRAMUSCULAR | Status: DC | PRN
  Administered 2017-07-29: 2 mg via INTRAVENOUS

## 2017-07-29 MED ORDER — MIDAZOLAM HCL 2 MG/2ML IJ SOLN
INTRAMUSCULAR | Status: AC
  Filled 2017-07-29: qty 2

## 2017-07-29 MED ORDER — DEXAMETHASONE SODIUM PHOSPHATE 10 MG/ML IJ SOLN
INTRAMUSCULAR | Status: DC | PRN
  Administered 2017-07-29: 8 mg via INTRAVENOUS

## 2017-07-29 MED ORDER — FENTANYL CITRATE (PF) 100 MCG/2ML IJ SOLN
INTRAMUSCULAR | Status: AC
  Filled 2017-07-29: qty 2

## 2017-07-29 MED ORDER — LACTATED RINGERS IV SOLN: INTRAVENOUS | Status: DC

## 2017-07-29 MED ORDER — PHENYLEPHRINE HCL 10 MG/ML IJ SOLN
INTRAMUSCULAR | Status: DC | PRN
  Administered 2017-07-29 (×5): 100 ug via INTRAVENOUS

## 2017-07-29 MED ORDER — LIDOCAINE HCL (CARDIAC) 20 MG/ML IV SOLN
INTRAVENOUS | Status: DC | PRN
  Administered 2017-07-29: 40 mg via INTRAVENOUS

## 2017-07-29 MED ORDER — FENTANYL CITRATE (PF) 100 MCG/2ML IJ SOLN: 25.0000 ug | INTRAMUSCULAR | Status: DC | PRN

## 2017-07-29 MED ORDER — HYDROCODONE-ACETAMINOPHEN 5-325 MG PO TABS: 1.0000 | ORAL_TABLET | ORAL | 0 refills | Status: DC | PRN

## 2017-07-29 MED ORDER — ONDANSETRON HCL 4 MG/2ML IJ SOLN: 4.0000 mg | Freq: Once | INTRAMUSCULAR | Status: DC | PRN

## 2017-07-29 MED ORDER — BUPIVACAINE-EPINEPHRINE (PF) 0.5% -1:200000 IJ SOLN
INTRAMUSCULAR | Status: AC
  Filled 2017-07-29: qty 30

## 2017-07-29 MED ORDER — GLYCOPYRROLATE 0.2 MG/ML IJ SOLN
INTRAMUSCULAR | Status: DC | PRN
  Administered 2017-07-29: 0.2 mg via INTRAVENOUS

## 2017-07-29 MED ORDER — BUPIVACAINE-EPINEPHRINE 0.5% -1:200000 IJ SOLN
INTRAMUSCULAR | Status: DC | PRN
  Administered 2017-07-29: 8 mL

## 2017-07-29 MED ORDER — PROPOFOL 10 MG/ML IV BOLUS
INTRAVENOUS | Status: AC
Start: 2017-07-29 — End: 2017-07-29
  Filled 2017-07-29: qty 20

## 2017-07-29 MED ORDER — PROPOFOL 10 MG/ML IV BOLUS
INTRAVENOUS | Status: DC | PRN
  Administered 2017-07-29: 200 mg via INTRAVENOUS

## 2017-07-29 MED ORDER — HYDROCODONE-ACETAMINOPHEN 5-325 MG PO TABS: 1.0000 | ORAL_TABLET | ORAL | Status: DC | PRN

## 2017-07-29 MED ORDER — LACTATED RINGERS IV SOLN
INTRAVENOUS | Status: DC | PRN
  Administered 2017-07-29: 11:00:00 via INTRAVENOUS

## 2017-07-29 MED ORDER — ONDANSETRON HCL 4 MG/2ML IJ SOLN
INTRAMUSCULAR | Status: DC | PRN
  Administered 2017-07-29: 4 mg via INTRAVENOUS

## 2017-07-29 MED ORDER — FENTANYL CITRATE (PF) 100 MCG/2ML IJ SOLN
INTRAMUSCULAR | Status: DC | PRN
  Administered 2017-07-29 (×6): 25 ug via INTRAVENOUS

## 2017-07-29 SURGICAL SUPPLY — 23 items
CANISTER SUCT 1200ML W/VALVE (MISCELLANEOUS) ×3 IMPLANT
CHLORAPREP W/TINT 26ML (MISCELLANEOUS) ×3 IMPLANT
CNTNR SPEC 2.5X3XGRAD LEK (MISCELLANEOUS)
CONT SPEC 4OZ STER OR WHT (MISCELLANEOUS)
CONTAINER SPEC 2.5X3XGRAD LEK (MISCELLANEOUS) IMPLANT
DERMABOND ADVANCED (GAUZE/BANDAGES/DRESSINGS) ×2
DERMABOND ADVANCED .7 DNX12 (GAUZE/BANDAGES/DRESSINGS) ×1 IMPLANT
DRAPE LAPAROTOMY 77X122 PED (DRAPES) ×3 IMPLANT
ELECT REM PT RETURN 9FT ADLT (ELECTROSURGICAL) ×3
ELECTRODE REM PT RTRN 9FT ADLT (ELECTROSURGICAL) ×1 IMPLANT
GLOVE BIO SURGEON STRL SZ7.5 (GLOVE) ×9 IMPLANT
GOWN STRL REUS W/ TWL LRG LVL3 (GOWN DISPOSABLE) ×3 IMPLANT
GOWN STRL REUS W/TWL LRG LVL3 (GOWN DISPOSABLE) ×6
KIT RM TURNOVER STRD PROC AR (KITS) ×3 IMPLANT
LABEL OR SOLS (LABEL) ×3 IMPLANT
NEEDLE HYPO 25X1 1.5 SAFETY (NEEDLE) ×3 IMPLANT
PACK BASIN MINOR ARMC (MISCELLANEOUS) ×3 IMPLANT
SUT CHROMIC 4 0 RB 1X27 (SUTURE) ×3 IMPLANT
SUT MNCRL 4-0 (SUTURE) ×2
SUT MNCRL 4-0 27XMFL (SUTURE) ×1
SUTURE MNCRL 4-0 27XMF (SUTURE) ×1 IMPLANT
SYRINGE 10CC LL (SYRINGE) ×3 IMPLANT
WATER STERILE IRR 1000ML POUR (IV SOLUTION) ×3 IMPLANT

## 2017-07-29 NOTE — Anesthesia Preprocedure Evaluation (Signed)
Anesthesia Evaluation  Patient identified by MRN, date of birth, ID band Patient awake    Reviewed: Allergy & Precautions, NPO status , Patient's Chart, lab work & pertinent test results  Airway Mallampati: II       Dental  (+) Upper Dentures   Pulmonary neg pulmonary ROS,    breath sounds clear to auscultation       Cardiovascular Exercise Tolerance: Good hypertension, Pt. on medications  Rhythm:Regular Rate:Normal     Neuro/Psych negative neurological ROS     GI/Hepatic Neg liver ROS, GERD  Medicated,  Endo/Other  negative endocrine ROS  Renal/GU negative Renal ROS  negative genitourinary   Musculoskeletal negative musculoskeletal ROS (+)   Abdominal Normal abdominal exam  (+)   Peds negative pediatric ROS (+)  Hematology negative hematology ROS (+)   Anesthesia Other Findings   Reproductive/Obstetrics                             Anesthesia Physical Anesthesia Plan  ASA: II  Anesthesia Plan: General   Post-op Pain Management:    Induction: Intravenous  PONV Risk Score and Plan:   Airway Management Planned: LMA  Additional Equipment:   Intra-op Plan:   Post-operative Plan: Extubation in OR  Informed Consent: I have reviewed the patients History and Physical, chart, labs and discussed the procedure including the risks, benefits and alternatives for the proposed anesthesia with the patient or authorized representative who has indicated his/her understanding and acceptance.     Plan Discussed with: CRNA  Anesthesia Plan Comments:         Anesthesia Quick Evaluation

## 2017-07-29 NOTE — Anesthesia Procedure Notes (Signed)
Procedure Name: LMA Insertion Performed by: Demetrius Charity Pre-anesthesia Checklist: Patient identified, Patient being monitored, Timeout performed, Emergency Drugs available and Suction available Patient Re-evaluated:Patient Re-evaluated prior to induction Oxygen Delivery Method: Circle system utilized Preoxygenation: Pre-oxygenation with 100% oxygen Induction Type: IV induction Ventilation: Mask ventilation without difficulty LMA: LMA inserted LMA Size: 3.0 Tube type: Oral Number of attempts: 1 Placement Confirmation: positive ETCO2 and breath sounds checked- equal and bilateral Tube secured with: Tape Dental Injury: Teeth and Oropharynx as per pre-operative assessment

## 2017-07-29 NOTE — Discharge Instructions (Addendum)
AMBULATORY SURGERY  DISCHARGE INSTRUCTIONS   1) The drugs that you were given will stay in your system until tomorrow so for the next 24 hours you should not:  A) Drive an automobile B) Make any legal decisions C) Drink any alcoholic beverage   2) You may resume regular meals tomorrow.  Today it is better to start with liquids and gradually work up to solid foods.  You may eat anything you prefer, but it is better to start with liquids, then soup and crackers, and gradually work up to solid foods.   3) Please notify your doctor immediately if you have any unusual bleeding, trouble breathing, redness and pain at the surgery site, drainage, fever, or pain not relieved by medication.    4) Additional Instructions:        Please contact your physician with any problems or Same Day Surgery at [phone], Monday through Friday 6 am to 4 pm, or [place] at [facility] number at [phone].Take Tylenol or Norco if needed for pain.  Should not drive or do anything dangerous when taking Norco.  May shower and blot dry.  Wear bra as desired for comfort and support.

## 2017-07-29 NOTE — Op Note (Signed)
OPERATIVE REPORT  PREOPERATIVE  DIAGNOSIS: . Left breast mass  POSTOPERATIVE DIAGNOSIS: . Left breast mass  PROCEDURE: . Excision left breast mass  ANESTHESIA:  General  SURGEON: Rochel Brome  MD   INDICATIONS: . She has past history of excision of ductal carcinoma in situ of the left breast. She has recent history of bleeding from the left nipple. She had mammogram and ultrasound depicting a small nodule in the retroareolar area immediately posterior to the nipple approximately 6 mm in depth.. Excision was recommended for further evaluation and treatment.  With the patient on the operating table in the supine position she was placed under general anesthesia. The left breast was prepared with ChloraPrep and draped in a sterile manner. The nipple was squeezed and identified a small drop of brown fluid which came out of a duct in the lower outer quadrant. A lacrimal duct probe was introduced in width and approximately 7 mm. There lacrimal duct probe was left in during the excision. A transversely oriented elliptical excision was carried out so that the ellipse was approximately 2 cm in length and included about two thirds of the nipple. This dissection was carried down into the breast using electrocautery for hemostasis. The left end of the skin ellipse was tagged with a stitch for the pathologist orientation. After this the probe was removed and the specimen was submitted for routine pathology. The wound was inspected and several tiny bleeding points cauterized. Subcuticular tissues were infiltrated with half percent Sensorcaine with epinephrine. The wound was closed with a running 4-0 Monocryl subcuticular suture and Dermabond  The patient tolerated surgery satisfactorily and was then prepared for transfer to the recovery room  [employer].D.

## 2017-07-29 NOTE — Anesthesia Post-op Follow-up Note (Signed)
Anesthesia QCDR form completed.        

## 2017-07-29 NOTE — H&P (Signed)
  She comes in today for excision of retroareolar left breast mass.  She reports no change in overall condition since the day of the office exam.  Recent tserum potassium was low at 3.0 and she was treated with a potassium supplement and today the potassium is 4.9.  The left side was marked YES  I discussed the plan for surgery

## 2017-07-29 NOTE — Transfer of Care (Signed)
Immediate Anesthesia Transfer of Care Note  Patient: Katie Thompson  Procedure(s) Performed: EXCISION OF BREAST MASS (Left )  Patient Location: PACU  Anesthesia Type:General  Level of Consciousness: sedated  Airway & Oxygen Therapy: Patient Spontanous Breathing and Patient connected to face mask oxygen  Post-op Assessment:Report given to RN, VSS  Post vital signs: Reviewed and stable  Last Vitals:  Vitals:   07/29/17 0839  BP: (!) 185/82  Pulse: 64  Resp: 16  Temp: 36.8 C  SpO2: 100%    Last Pain:  Vitals:   07/29/17 0839  TempSrc: Oral         Complications: No apparent anesthesia complications

## 2017-07-29 NOTE — Anesthesia Postprocedure Evaluation (Signed)
Anesthesia Post Note  Patient: Katie Thompson  Procedure(s) Performed: EXCISION OF BREAST MASS (Left )  Patient location during evaluation: PACU Anesthesia Type: General Level of consciousness: awake Pain management: pain level controlled Vital Signs Assessment: post-procedure vital signs reviewed and stable Respiratory status: nonlabored ventilation Cardiovascular status: stable Anesthetic complications: no     Last Vitals:  Vitals:   07/29/17 0839 07/29/17 1215  BP: (!) 185/82 133/73  Pulse: 64 84  Resp: 16 (!) 30  Temp: 36.8 C 36.6 C  SpO2: 100% 100%    Last Pain:  Vitals:   07/29/17 0839  TempSrc: Oral                 VAN STAVEREN,Evelin Cake

## 2017-07-30 LAB — SURGICAL PATHOLOGY

## 2017-09-16 ENCOUNTER — Other Ambulatory Visit: Payer: Self-pay | Admitting: Obstetrics and Gynecology

## 2017-09-16 DIAGNOSIS — Z1231 Encounter for screening mammogram for malignant neoplasm of breast: Secondary | ICD-10-CM

## 2017-09-16 DIAGNOSIS — Z124 Encounter for screening for malignant neoplasm of cervix: Secondary | ICD-10-CM | POA: Diagnosis not present

## 2017-09-16 DIAGNOSIS — R928 Other abnormal and inconclusive findings on diagnostic imaging of breast: Secondary | ICD-10-CM

## 2017-09-16 DIAGNOSIS — Z1211 Encounter for screening for malignant neoplasm of colon: Secondary | ICD-10-CM | POA: Diagnosis not present

## 2017-11-12 DIAGNOSIS — Z853 Personal history of malignant neoplasm of breast: Secondary | ICD-10-CM | POA: Diagnosis not present

## 2017-12-24 ENCOUNTER — Ambulatory Visit
Admission: RE | Admit: 2017-12-24 | Discharge: 2017-12-24 | Disposition: A | Discharge status: Home / Self Care | Payer: BLUE CROSS/BLUE SHIELD | Source: Ambulatory Visit | Attending: Obstetrics and Gynecology | Admitting: Obstetrics and Gynecology

## 2017-12-24 DIAGNOSIS — R928 Other abnormal and inconclusive findings on diagnostic imaging of breast: Secondary | ICD-10-CM | POA: Diagnosis not present

## 2017-12-24 DIAGNOSIS — R921 Mammographic calcification found on diagnostic imaging of breast: Secondary | ICD-10-CM | POA: Insufficient documentation

## 2017-12-24 DIAGNOSIS — Z1231 Encounter for screening mammogram for malignant neoplasm of breast: Secondary | ICD-10-CM

## 2017-12-24 DIAGNOSIS — Z9889 Other specified postprocedural states: Secondary | ICD-10-CM | POA: Diagnosis not present

## 2017-12-25 ENCOUNTER — Other Ambulatory Visit: Payer: Self-pay | Admitting: Obstetrics and Gynecology

## 2017-12-25 DIAGNOSIS — R928 Other abnormal and inconclusive findings on diagnostic imaging of breast: Secondary | ICD-10-CM

## 2017-12-25 DIAGNOSIS — R921 Mammographic calcification found on diagnostic imaging of breast: Secondary | ICD-10-CM

## 2018-01-05 ENCOUNTER — Ambulatory Visit
Admission: RE | Admit: 2018-01-05 | Discharge: 2018-01-05 | Disposition: A | Discharge status: Home / Self Care | Payer: BLUE CROSS/BLUE SHIELD | Source: Ambulatory Visit | Attending: Obstetrics and Gynecology | Admitting: Obstetrics and Gynecology

## 2018-01-05 DIAGNOSIS — R921 Mammographic calcification found on diagnostic imaging of breast: Secondary | ICD-10-CM

## 2018-01-05 DIAGNOSIS — R928 Other abnormal and inconclusive findings on diagnostic imaging of breast: Secondary | ICD-10-CM

## 2018-01-05 DIAGNOSIS — N6012 Diffuse cystic mastopathy of left breast: Secondary | ICD-10-CM | POA: Diagnosis not present

## 2018-01-05 HISTORY — PX: BREAST BIOPSY: SHX20

## 2018-01-06 DIAGNOSIS — E7801 Familial hypercholesterolemia: Secondary | ICD-10-CM | POA: Diagnosis not present

## 2018-01-06 DIAGNOSIS — E559 Vitamin D deficiency, unspecified: Secondary | ICD-10-CM | POA: Diagnosis not present

## 2018-01-06 DIAGNOSIS — E119 Type 2 diabetes mellitus without complications: Secondary | ICD-10-CM | POA: Diagnosis not present

## 2018-01-06 DIAGNOSIS — E7800 Pure hypercholesterolemia, unspecified: Secondary | ICD-10-CM | POA: Diagnosis not present

## 2018-01-06 LAB — SURGICAL PATHOLOGY

## 2018-01-13 DIAGNOSIS — E559 Vitamin D deficiency, unspecified: Secondary | ICD-10-CM | POA: Diagnosis not present

## 2018-01-13 DIAGNOSIS — I10 Essential (primary) hypertension: Secondary | ICD-10-CM | POA: Diagnosis not present

## 2018-01-13 DIAGNOSIS — E119 Type 2 diabetes mellitus without complications: Secondary | ICD-10-CM | POA: Diagnosis not present

## 2018-01-13 DIAGNOSIS — M8580 Other specified disorders of bone density and structure, unspecified site: Secondary | ICD-10-CM | POA: Diagnosis not present

## 2018-01-13 DIAGNOSIS — C801 Malignant (primary) neoplasm, unspecified: Secondary | ICD-10-CM | POA: Diagnosis not present

## 2018-01-13 DIAGNOSIS — E7800 Pure hypercholesterolemia, unspecified: Secondary | ICD-10-CM | POA: Diagnosis not present

## 2018-03-16 DIAGNOSIS — M8580 Other specified disorders of bone density and structure, unspecified site: Secondary | ICD-10-CM | POA: Diagnosis not present

## 2018-03-16 DIAGNOSIS — E559 Vitamin D deficiency, unspecified: Secondary | ICD-10-CM | POA: Diagnosis not present

## 2018-03-16 DIAGNOSIS — I10 Essential (primary) hypertension: Secondary | ICD-10-CM | POA: Diagnosis not present

## 2018-05-12 DIAGNOSIS — Z853 Personal history of malignant neoplasm of breast: Secondary | ICD-10-CM | POA: Diagnosis not present

## 2018-05-25 DIAGNOSIS — H401131 Primary open-angle glaucoma, bilateral, mild stage: Secondary | ICD-10-CM | POA: Diagnosis not present

## 2018-05-26 ENCOUNTER — Inpatient Hospital Stay: Payer: BLUE CROSS/BLUE SHIELD | Attending: Internal Medicine | Admitting: Internal Medicine

## 2018-05-26 ENCOUNTER — Inpatient Hospital Stay: Payer: BLUE CROSS/BLUE SHIELD

## 2018-05-26 ENCOUNTER — Other Ambulatory Visit: Payer: Self-pay | Admitting: Internal Medicine

## 2018-05-26 VITALS — BP 184/93 | HR 78 | Temp 97.8°F | Resp 16 | Wt 190.6 lb

## 2018-05-26 DIAGNOSIS — Z170 Estrogen receptor positive status [ER+]: Secondary | ICD-10-CM | POA: Diagnosis not present

## 2018-05-26 DIAGNOSIS — Z79810 Long term (current) use of selective estrogen receptor modulators (SERMs): Secondary | ICD-10-CM | POA: Insufficient documentation

## 2018-05-26 DIAGNOSIS — D0512 Intraductal carcinoma in situ of left breast: Secondary | ICD-10-CM

## 2018-05-26 DIAGNOSIS — D649 Anemia, unspecified: Secondary | ICD-10-CM | POA: Diagnosis not present

## 2018-05-26 LAB — COMPREHENSIVE METABOLIC PANEL
ALT: 11 U/L (ref 0–44)
AST: 20 U/L (ref 15–41)
Albumin: 3.6 g/dL (ref 3.5–5.0)
Alkaline Phosphatase: 80 U/L (ref 38–126)
Anion gap: 9 (ref 5–15)
BUN: 16 mg/dL (ref 8–23)
CHLORIDE: 108 mmol/L (ref 98–111)
CO2: 24 mmol/L (ref 22–32)
Calcium: 9.1 mg/dL (ref 8.9–10.3)
Creatinine, Ser: 0.96 mg/dL (ref 0.44–1.00)
GFR, EST NON AFRICAN AMERICAN: 57 mL/min — AB (ref >60)
Glucose, Bld: 115 mg/dL — ABNORMAL HIGH (ref 70–99)
POTASSIUM: 3.9 mmol/L (ref 3.5–5.1)
Sodium: 141 mmol/L (ref 135–145)
Total Bilirubin: 0.4 mg/dL (ref 0.3–1.2)
Total Protein: 7.4 g/dL (ref 6.5–8.1)

## 2018-05-26 LAB — CBC WITH DIFFERENTIAL/PLATELET
Basophils Absolute: 0.1 10*3/uL (ref 0–0.1)
Basophils Relative: 1 %
Eosinophils Absolute: 0.1 10*3/uL (ref 0–0.7)
Eosinophils Relative: 2 %
HCT: 35.6 % (ref 35.0–47.0)
HEMOGLOBIN: 11.8 g/dL — AB (ref 12.0–16.0)
LYMPHS PCT: 38 %
Lymphs Abs: 2.4 10*3/uL (ref 1.0–3.6)
MCH: 28.5 pg (ref 26.0–34.0)
MCHC: 33.1 g/dL (ref 32.0–36.0)
MCV: 86.3 fL (ref 80.0–100.0)
Monocytes Absolute: 0.5 10*3/uL (ref 0.2–0.9)
Monocytes Relative: 7 %
NEUTROS PCT: 52 %
Neutro Abs: 3.3 10*3/uL (ref 1.4–6.5)
Platelets: 256 10*3/uL (ref 150–440)
RBC: 4.12 MIL/uL (ref 3.80–5.20)
RDW: 16 % — ABNORMAL HIGH (ref 11.5–14.5)
WBC: 6.4 10*3/uL (ref 3.6–11.0)

## 2018-05-26 NOTE — Assessment & Plan Note (Addendum)
#   LEft breast DCIS: s/p lumpectomy post radiation currently on tamoxifen for 5 years; STOP tam sep 2019.  Stable.  No evidence of recurrence.  # Anemia- 11.9-STABLE.   over 3 years. No iron deficiency.  Monitor for now.  # Elevated Blood pressure- 180s/ even at work; worsened-recommend keeping a log of blood pressures.  Taking to PCP.  Discussed the risk of stroke heart attacks and kidney failure with poorly controlled blood pressures.  # follow up in 12 months.  Cc; Dr.Johnston in [facility]

## 2018-05-26 NOTE — Progress Notes (Signed)
[facility] OFFICE PROGRESS NOTE  Patient Care Team: Leonel Ramsay, MD as PCP - General (Infectious Diseases)  Cancer Staging No matching staging information was found for the patient.   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  ER positive (75%), PR positive (25%)  [Dr.Pandit; Dr. Smith] Patient completed radiation, started Tamoxifen therapy end of June 2014 (planned for 5 years).;  # STOP TAMOXIFEN sep 2019 [5 years]     Ductal carcinoma in situ (DCIS) of left breast      INTERVAL HISTORY:  Katie Thompson 72 y.o.  female pleasant patient above history of DCIS-currently on tamoxifen is here for follow-up.  Denies any blood clots.  Denies any vaginal bleeding.  Denies any new lumps or bumps.  Denies any headaches.  Denies any swelling in the legs.  She does complain of fatigue. Review of Systems  Constitutional: Positive for malaise/fatigue. Negative for chills, diaphoresis, fever and weight loss.  HENT: Negative for nosebleeds and sore throat.   Eyes: Negative for double vision.  Respiratory: Negative for cough, hemoptysis, sputum production, shortness of breath and wheezing.   Cardiovascular: Negative for chest pain, palpitations, orthopnea and leg swelling.  Gastrointestinal: Negative for abdominal pain, blood in stool, constipation, diarrhea, heartburn, melena, nausea and vomiting.  Genitourinary: Negative for dysuria, frequency and urgency.  Musculoskeletal: Negative for back pain and joint pain.  Skin: Negative.  Negative for itching and rash.  Neurological: Negative for dizziness, tingling, focal weakness, weakness and headaches.  Endo/Heme/Allergies: Does not bruise/bleed easily.  Psychiatric/Behavioral: Negative for depression. The patient is not nervous/anxious and does not have insomnia.       PAST MEDICAL HISTORY :  Past Medical History:   Diagnosis Date  . Breast cancer ([place]) 2014   LT with radiation  . Diverticulosis feb. 2015   from colonoscopy  . GERD (gastroesophageal reflux disease)   . Hyperlipidemia   . Hypertension     PAST SURGICAL HISTORY :   Past Surgical History:  Procedure Laterality Date  . APPENDECTOMY    . BREAST BIOPSY Left 2016   core  . BREAST BIOPSY Left 01/05/2018   Affirm Bx- path pendind  . BREAST EXCISIONAL BIOPSY Left 07/29/2017   SCLEROTIC INTRADUCTAL PAPILLOMA.  Marland Kitchen BREAST LUMPECTOMY Left 2014   with radiation  . BTL    . EXCISION OF BREAST LESION Left 07/29/2017   Procedure: EXCISION OF BREAST MASS;  Surgeon: Leonie Green, MD;  Location: [facility] ORS;  Service: General;  Laterality: Left;  . HERNIA REPAIR    . NASAL SINUS SURGERY      FAMILY HISTORY :   Family History  Problem Relation Age of Onset  . Ovarian cancer Mother   . Hypertension Mother   . Diabetes Paternal Grandmother   . Hypertension Paternal Grandmother   . Breast cancer Paternal Aunt 65    SOCIAL HISTORY:   Social History   Tobacco Use  . Smoking status: Never Smoker  . Smokeless tobacco: Never Used  Substance Use Topics  . Alcohol use: No  . Drug use: No    ALLERGIES:  is allergic to codeine and gatifloxacin.  MEDICATIONS:  Current Outpatient Medications  Medication Sig Dispense Refill  . aspirin EC 81 MG tablet Take 81 mg by mouth daily.     Marland Kitchen atorvastatin (LIPITOR) 10 MG tablet Take 10 mg by mouth daily.     Marland Kitchen  calcium carbonate (TUMS - DOSED IN MG ELEMENTAL CALCIUM) 500 MG chewable tablet Chew 1 tablet by mouth 2 (two) times daily.    . Cholecalciferol 10000 units CAPS Take 1 capsule by mouth daily.    . dorzolamide (TRUSOPT) 2 % ophthalmic solution Place 1 drop into both eyes 2 (two) times daily.    . ergocalciferol (VITAMIN D2) 50000 units capsule Take 50,000 Units by mouth every Monday.    Marland Kitchen HYDROcodone-acetaminophen (NORCO) 5-325 MG tablet Take 1 tablet by mouth every 4 (four) hours as  needed for moderate pain. 10 tablet 0  . losartan-hydrochlorothiazide (HYZAAR) 50-12.5 MG tablet Take 1 tablet by mouth daily.     Earney Navy Bicarbonate (ZEGERID OTC) 20-1100 MG CAPS capsule Take 1 capsule by mouth daily before breakfast.    . tamoxifen (NOLVADEX) 20 MG tablet Take 1 tablet (20 mg total) by mouth daily. 30 tablet 12  . verapamil (CALAN-SR) 240 MG CR tablet Take 240 mg by mouth daily.      No current facility-administered medications for this visit.     PHYSICAL EXAMINATION: ECOG PERFORMANCE STATUS: 0 - Asymptomatic  BP (!) 184/93 (BP Location: Left Arm, Patient Position: Sitting)   Pulse 78   Temp 97.8 F (36.6 C) (Tympanic)   Resp 16   Wt 190 lb 9.4 oz (86.4 kg)   BMI 33.76 kg/m   Filed Weights   05/26/18 1043  Weight: 190 lb 9.4 oz (86.4 kg)    GENERAL: Well-nourished well-developed; Alert, no distress and comfortable.  Alone. EYES: no pallor or icterus OROPHARYNX: no thrush or ulceration; NECK: supple; no lymph nodes felt. LYMPH:  no palpable lymphadenopathy in the axillary or inguinal regions LUNGS: Decreased breath sounds auscultation bilaterally. No wheeze or crackles HEART/CVS: regular rate & rhythm and no murmurs; No lower extremity edema ABDOMEN:abdomen soft, non-tender and normal bowel sounds. No hepatomegaly or splenomegaly.  Musculoskeletal:no cyanosis of digits and no clubbing  PSYCH: alert & oriented x 3 with fluent speech NEURO: no focal motor/sensory deficits SKIN:  no rashes or significant lesions Right and left BREAST exam [in the presence of nurse]- no unusual skin changes or dominant masses felt. Surgical scars noted.       LABORATORY DATA:  I have reviewed the data as listed    Component Value Date/Time   NA 141 05/26/2018 1032   NA 137 02/09/2013 1604   K 3.9 05/26/2018 1032   K 4.0 02/09/2013 1604   CL 108 05/26/2018 1032   CL 101 02/09/2013 1604   CO2 24 05/26/2018 1032   CO2 29 02/09/2013 1604   GLUCOSE 115  (H) 05/26/2018 1032   GLUCOSE 80 02/09/2013 1604   BUN 16 05/26/2018 1032   BUN 16 02/09/2013 1604   CREATININE 0.96 05/26/2018 1032   CREATININE 1.13 05/12/2014 1032   CALCIUM 9.1 05/26/2018 1032   CALCIUM 9.5 02/09/2013 1604   PROT 7.4 05/26/2018 1032   PROT 7.1 05/12/2014 1032   ALBUMIN 3.6 05/26/2018 1032   ALBUMIN 3.3 (L) 05/12/2014 1032   AST 20 05/26/2018 1032   AST 24 05/12/2014 1032   ALT 11 05/26/2018 1032   ALT 18 05/12/2014 1032   ALKPHOS 80 05/26/2018 1032   ALKPHOS 78 05/12/2014 1032   BILITOT 0.4 05/26/2018 1032   BILITOT 0.3 05/12/2014 1032   GFRNONAA 57 (L) 05/26/2018 1032   GFRNONAA 49 (L) 05/12/2014 1032   GFRAA >60 05/26/2018 1032   GFRAA 57 (L) 05/12/2014 1032    No results found  for: SPEP, UPEP  Lab Results  Component Value Date   WBC 6.4 05/26/2018   NEUTROABS 3.3 05/26/2018   HGB 11.8 (L) 05/26/2018   HCT 35.6 05/26/2018   MCV 86.3 05/26/2018   PLT 256 05/26/2018      Chemistry      Component Value Date/Time   NA 141 05/26/2018 1032   NA 137 02/09/2013 1604   K 3.9 05/26/2018 1032   K 4.0 02/09/2013 1604   CL 108 05/26/2018 1032   CL 101 02/09/2013 1604   CO2 24 05/26/2018 1032   CO2 29 02/09/2013 1604   BUN 16 05/26/2018 1032   BUN 16 02/09/2013 1604   CREATININE 0.96 05/26/2018 1032   CREATININE 1.13 05/12/2014 1032      Component Value Date/Time   CALCIUM 9.1 05/26/2018 1032   CALCIUM 9.5 02/09/2013 1604   ALKPHOS 80 05/26/2018 1032   ALKPHOS 78 05/12/2014 1032   AST 20 05/26/2018 1032   AST 24 05/12/2014 1032   ALT 11 05/26/2018 1032   ALT 18 05/12/2014 1032   BILITOT 0.4 05/26/2018 1032   BILITOT 0.3 05/12/2014 1032       RADIOGRAPHIC STUDIES: I have personally reviewed the radiological images as listed and agreed with the findings in the report. No results found.   ASSESSMENT & PLAN:  Ductal carcinoma in situ (DCIS) of left breast # LEft breast DCIS: s/p lumpectomy post radiation currently on tamoxifen for 5  years; STOP tam sep 2019.  Stable.  No evidence of recurrence.  # Anemia- 11.9-STABLE.   over 3 years. No iron deficiency.  Monitor for now.  # Elevated Blood pressure- 180s/ even at work; worsened-recommend keeping a log of blood pressures.  Taking to PCP.  Discussed the risk of stroke heart attacks and kidney failure with poorly controlled blood pressures.  # follow up in 12 months.  Cc; Dr.Johnston in [place]   No orders of the defined types were placed in this encounter.  All questions were answered. The patient knows to call the clinic with any problems, questions or concerns.      Cammie Sickle, MD 06/02/2018 6:37 PM

## 2018-06-01 DIAGNOSIS — H401131 Primary open-angle glaucoma, bilateral, mild stage: Secondary | ICD-10-CM | POA: Diagnosis not present

## 2018-07-09 DIAGNOSIS — M8580 Other specified disorders of bone density and structure, unspecified site: Secondary | ICD-10-CM | POA: Diagnosis not present

## 2018-07-09 DIAGNOSIS — E559 Vitamin D deficiency, unspecified: Secondary | ICD-10-CM | POA: Diagnosis not present

## 2018-07-16 DIAGNOSIS — C801 Malignant (primary) neoplasm, unspecified: Secondary | ICD-10-CM | POA: Diagnosis not present

## 2018-07-16 DIAGNOSIS — E119 Type 2 diabetes mellitus without complications: Secondary | ICD-10-CM | POA: Diagnosis not present

## 2018-07-16 DIAGNOSIS — E559 Vitamin D deficiency, unspecified: Secondary | ICD-10-CM | POA: Diagnosis not present

## 2018-07-16 DIAGNOSIS — E7800 Pure hypercholesterolemia, unspecified: Secondary | ICD-10-CM | POA: Diagnosis not present

## 2018-07-16 DIAGNOSIS — I10 Essential (primary) hypertension: Secondary | ICD-10-CM | POA: Diagnosis not present

## 2018-09-22 DIAGNOSIS — Z1211 Encounter for screening for malignant neoplasm of colon: Secondary | ICD-10-CM | POA: Diagnosis not present

## 2018-09-22 DIAGNOSIS — N8111 Cystocele, midline: Secondary | ICD-10-CM | POA: Diagnosis not present

## 2018-09-22 DIAGNOSIS — Z1231 Encounter for screening mammogram for malignant neoplasm of breast: Secondary | ICD-10-CM | POA: Diagnosis not present

## 2018-09-22 DIAGNOSIS — N816 Rectocele: Secondary | ICD-10-CM | POA: Diagnosis not present

## 2018-09-22 DIAGNOSIS — Z01419 Encounter for gynecological examination (general) (routine) without abnormal findings: Secondary | ICD-10-CM | POA: Diagnosis not present

## 2018-10-01 DIAGNOSIS — Z1211 Encounter for screening for malignant neoplasm of colon: Secondary | ICD-10-CM | POA: Diagnosis not present

## 2018-10-23 ENCOUNTER — Other Ambulatory Visit: Payer: Self-pay | Admitting: Obstetrics and Gynecology

## 2018-10-23 DIAGNOSIS — Z1231 Encounter for screening mammogram for malignant neoplasm of breast: Secondary | ICD-10-CM

## 2018-12-28 ENCOUNTER — Ambulatory Visit
Admission: RE | Admit: 2018-12-28 | Discharge: 2018-12-28 | Disposition: A | Discharge status: Home / Self Care | Payer: BLUE CROSS/BLUE SHIELD | Source: Ambulatory Visit | Attending: Obstetrics and Gynecology | Admitting: Obstetrics and Gynecology

## 2018-12-28 ENCOUNTER — Other Ambulatory Visit: Payer: Self-pay

## 2018-12-28 DIAGNOSIS — Z1231 Encounter for screening mammogram for malignant neoplasm of breast: Secondary | ICD-10-CM | POA: Diagnosis not present

## 2018-12-28 HISTORY — DX: Personal history of irradiation: Z92.3

## 2019-01-05 DIAGNOSIS — Z853 Personal history of malignant neoplasm of breast: Secondary | ICD-10-CM | POA: Diagnosis not present

## 2019-01-12 DIAGNOSIS — E559 Vitamin D deficiency, unspecified: Secondary | ICD-10-CM | POA: Diagnosis not present

## 2019-01-12 DIAGNOSIS — E119 Type 2 diabetes mellitus without complications: Secondary | ICD-10-CM | POA: Diagnosis not present

## 2019-01-12 DIAGNOSIS — E7800 Pure hypercholesterolemia, unspecified: Secondary | ICD-10-CM | POA: Diagnosis not present

## 2019-06-01 ENCOUNTER — Other Ambulatory Visit: Payer: BLUE CROSS/BLUE SHIELD

## 2019-06-01 ENCOUNTER — Other Ambulatory Visit: Payer: Self-pay | Admitting: *Deleted

## 2019-06-01 ENCOUNTER — Ambulatory Visit: Payer: BLUE CROSS/BLUE SHIELD | Admitting: Internal Medicine

## 2019-06-01 DIAGNOSIS — D0512 Intraductal carcinoma in situ of left breast: Secondary | ICD-10-CM

## 2019-06-06 ENCOUNTER — Other Ambulatory Visit: Payer: Self-pay

## 2019-06-06 NOTE — Progress Notes (Signed)
[facility]  [address], [address] [place], [place] [address] Phone: [phone]  Fax: [phone]   Clinic Day:  06/07/2019  Referring physician: Leonel Ramsay, MD  Chief Complaint: Katie Thompson is a 73 y.o. female with left breast DCIS who is seen for new patient assessment.   HPI:  The patient underwent yearly screening mammograms.  Screening mammogram on 12/14/2012 revealed a cluster of indeterminate calcifications in the superior left breast. There was a small, round asymmetry in the junction of the posterior middle depths at the level of the nipple line. Bilateral diagnostic mammogram on 12/21/2012 revealed a small cluster of suspicious microcalcifications in the superior left breast.  Breast biopsy on 02/01/2013 revealed ductal carcinoma in situ, largest linear focus measuring 0.3 cm, grade II, associated with central necrosis and calcifications.   She underwent a left lumpectomy on 02/15/2013 by Dr Rochel Brome. Pathology revealed a 16 mm area of grade II residual ductal carcinoma in situ, cribriform type, negative for invasive carcinoma.  Margins were clear. DCIS was ER 75%, and PR 25%.  Pathologic stage was pTis pNx.  She underwent radiation with accelerated partial breast irradiation, 3400 cGy in 10 fractions at 340 cGy twice a day with Dr. Baruch Gouty.   She began endocrine therapy in 03/2013.  She received tamoxifen x 5 years (completed in 06/2018).   She was originally followed by Dr. Ma Hillock. She was then followed by Dr. Rogue Bussing notes reviewed.   The patient was last seen in the medical oncology clinic on 05/26/2018 by Dr. Rogue Bussing. At that time, she was fatigued. Exam revealed no abnormal findings.   Bilateral screening mammogram on 12/28/2018 revealed no evidence of malignancy.   During the interim, she has felt "ok".  She denies any breast concerns.   Past Medical History:  Diagnosis Date  . Breast cancer ([place]) 2014   LT with  radiation  . Diverticulosis feb. 2015   from colonoscopy  . GERD (gastroesophageal reflux disease)   . Hyperlipidemia   . Hypertension   . Personal history of radiation therapy 2014   DCIS left breast    Past Surgical History:  Procedure Laterality Date  . APPENDECTOMY    . BREAST BIOPSY Left 2016   coil marker, BENIGN BREAST TISSUE WITH CLUSTERED MICROCYSTS AND STROMAL FIBROSIS.   Marland Kitchen BREAST BIOPSY Left 01/05/2018   Affirm Bx- x marker, FIBROCYSTIC CHANGE WITH MICROCALCIFICATIONS  . BREAST BIOPSY Left 02/04/2013   DCIS  . BREAST EXCISIONAL BIOPSY Left 07/29/2017   SCLEROTIC INTRADUCTAL PAPILLOMA.  Marland Kitchen BREAST LUMPECTOMY Left 2014   DCIS with clear margins.   . BTL    . EXCISION OF BREAST LESION Left 07/29/2017   Procedure: EXCISION OF BREAST MASS;  Surgeon: Leonie Green, MD;  Location: [facility] ORS;  Service: General;  Laterality: Left;  . HERNIA REPAIR    . NASAL SINUS SURGERY      Family History  Problem Relation Age of Onset  . Ovarian cancer Mother   . Hypertension Mother   . Diabetes Paternal Grandmother   . Hypertension Paternal Grandmother   . Breast cancer Paternal Aunt 66    Social History:  reports that she has never smoked. She has never used smokeless tobacco. She reports that she does not drink alcohol or use drugs.  She denies any exposure to radiation or toxins.  She works at [employer] full-time.  She lives in [place].  The patient is alone today.  Allergies:  Allergies  Allergen Reactions  .  Codeine Nausea And Vomiting  . Gatifloxacin Hives    Current Medications: Current Outpatient Medications  Medication Sig Dispense Refill  . aspirin EC 81 MG tablet Take 81 mg by mouth daily.     Marland Kitchen atorvastatin (LIPITOR) 10 MG tablet Take 10 mg by mouth daily.     . calcium carbonate (TUMS - DOSED IN MG ELEMENTAL CALCIUM) 500 MG chewable tablet Chew 1 tablet by mouth 2 (two) times daily.    . Cholecalciferol 10000 units CAPS Take 1 capsule by mouth daily.     . dorzolamide (TRUSOPT) 2 % ophthalmic solution Place 1 drop into both eyes 2 (two) times daily.    Marland Kitchen losartan-hydrochlorothiazide (HYZAAR) 50-12.5 MG tablet Take 1 tablet by mouth daily.     . tamoxifen (NOLVADEX) 20 MG tablet Take 1 tablet (20 mg total) by mouth daily. 30 tablet 12  . verapamil (CALAN-SR) 240 MG CR tablet Take 240 mg by mouth daily.     . ergocalciferol (VITAMIN D2) 50000 units capsule Take 50,000 Units by mouth every Monday.    Marland Kitchen HYDROcodone-acetaminophen (NORCO) 5-325 MG tablet Take 1 tablet by mouth every 4 (four) hours as needed for moderate pain. (Patient not taking: Reported on 06/07/2019) 10 tablet 0  . Omeprazole-Sodium Bicarbonate (ZEGERID OTC) 20-1100 MG CAPS capsule Take 1 capsule by mouth daily before breakfast.     No current facility-administered medications for this visit.     Review of Systems  Constitutional: Negative for chills, diaphoresis, fever, malaise/fatigue and weight loss.       Feels "ok".  HENT: Negative.  Negative for congestion, ear pain, hearing loss, nosebleeds, sinus pain and sore throat.   Eyes: Negative for blurred vision, double vision, photophobia and pain.       Vision is "a little off". New Rx for glasses.  Respiratory: Negative.  Negative for cough, sputum production, shortness of breath and wheezing.   Cardiovascular: Negative.  Negative for chest pain, palpitations, orthopnea and leg swelling.  Gastrointestinal: Negative.  Negative for abdominal pain, blood in stool, constipation, diarrhea, melena, nausea and vomiting.       Colonoscopy is due.  Genitourinary: Negative.  Negative for dysuria, frequency, hematuria and urgency.  Musculoskeletal: Positive for joint pain. Negative for back pain and myalgias.  Skin: Negative.  Negative for rash.  Neurological: Negative.  Negative for dizziness, tingling, sensory change, focal weakness, weakness and headaches.  Endo/Heme/Allergies: Negative.  Does not bruise/bleed easily.   Psychiatric/Behavioral: Negative.  Negative for depression, memory loss and substance abuse. The patient is not nervous/anxious and does not have insomnia.   All other systems reviewed and are negative.  Performance status (ECOG): 1  Vitals Blood pressure 136/66, pulse (!) 56, temperature (!) 96.8 F (36 C), temperature source Tympanic, resp. rate 18, height 5\' 3"  (1.6 m), weight 188 lb 4.4 oz (85.4 kg), SpO2 100 %.   Physical Exam  Constitutional: She is oriented to person, place, and time. She appears well-developed and well-nourished. No distress.  HENT:  Head: Normocephalic and atraumatic.  Mouth/Throat: Oropharynx is clear and moist. No oropharyngeal exudate.  Graying hair pulled up with braid wrapped around.  Upper dentures.  Mask.  Eyes: Pupils are equal, round, and reactive to light. Conjunctivae and EOM are normal. No scleral icterus.  Glasses.  Brown eyes.  Neck: Normal range of motion. Neck supple. No JVD present.  Cardiovascular: Normal rate, regular rhythm and normal heart sounds. Exam reveals no gallop and no friction rub.  No murmur heard. Pulmonary/Chest:  Effort normal and breath sounds normal. No respiratory distress. She has no wheezes. Right breast exhibits no inverted nipple, no mass, no nipple discharge, no skin change (10 o'clock fibrocystic changes) and no tenderness. Left breast exhibits no inverted nipple, no mass, no nipple discharge, no skin change (s/p lumpectomy with lateral post operative changes) and no tenderness.  Abdominal: Soft. Bowel sounds are normal. She exhibits no distension. There is no abdominal tenderness. There is no rebound and no guarding.  Musculoskeletal: Normal range of motion.        General: No edema.  Lymphadenopathy:    She has no cervical adenopathy.    She has no axillary adenopathy.       Right: No supraclavicular adenopathy present.       Left: No supraclavicular adenopathy present.  Neurological: She is alert and oriented to  person, place, and time.  Skin: Skin is warm and dry. No rash noted. She is not diaphoretic. No erythema. No pallor.  Psychiatric: She has a normal mood and affect. Her behavior is normal. Judgment and thought content normal.  Nursing note and vitals reviewed.   Appointment on 06/07/2019  Component Date Value Ref Range Status  . WBC 06/07/2019 6.7  4.0 - 10.5 K/uL Final  . RBC 06/07/2019 4.33  3.87 - 5.11 MIL/uL Final  . Hemoglobin 06/07/2019 11.8* 12.0 - 15.0 g/dL Final  . HCT 06/07/2019 36.1  36.0 - 46.0 % Final  . MCV 06/07/2019 83.4  80.0 - 100.0 fL Final  . MCH 06/07/2019 27.3  26.0 - 34.0 pg Final  . MCHC 06/07/2019 32.7  30.0 - 36.0 g/dL Final  . RDW 06/07/2019 16.8* 11.5 - 15.5 % Final  . Platelets 06/07/2019 300  150 - 400 K/uL Final  . nRBC 06/07/2019 0.0  0.0 - 0.2 % Final  . Neutrophils Relative % 06/07/2019 51  % Final  . Neutro Abs 06/07/2019 3.3  1.7 - 7.7 K/uL Final  . Lymphocytes Relative 06/07/2019 39  % Final  . Lymphs Abs 06/07/2019 2.6  0.7 - 4.0 K/uL Final  . Monocytes Relative 06/07/2019 8  % Final  . Monocytes Absolute 06/07/2019 0.6  0.1 - 1.0 K/uL Final  . Eosinophils Relative 06/07/2019 1  % Final  . Eosinophils Absolute 06/07/2019 0.1  0.0 - 0.5 K/uL Final  . Basophils Relative 06/07/2019 1  % Final  . Basophils Absolute 06/07/2019 0.1  0.0 - 0.1 K/uL Final  . Immature Granulocytes 06/07/2019 0  % Final  . Abs Immature Granulocytes 06/07/2019 0.02  0.00 - 0.07 K/uL Final   Performed at [facility], [address], [place], [place] [address]  . Sodium 06/07/2019 137  135 - 145 mmol/L Final  . Potassium 06/07/2019 3.7  3.5 - 5.1 mmol/L Final  . Chloride 06/07/2019 105  98 - 111 mmol/L Final  . CO2 06/07/2019 23  22 - 32 mmol/L Final  . Glucose, Bld 06/07/2019 113* 70 - 99 mg/dL Final  . BUN 06/07/2019 19  8 - 23 mg/dL Final  . Creatinine, Ser 06/07/2019 0.89  0.44 - 1.00 mg/dL Final  . Calcium 06/07/2019 9.4  8.9 - 10.3 mg/dL Final  .  Total Protein 06/07/2019 8.0  6.5 - 8.1 g/dL Final  . Albumin 06/07/2019 3.8  3.5 - 5.0 g/dL Final  . AST 06/07/2019 17  15 - 41 U/L Final  . ALT 06/07/2019 10  0 - 44 U/L Final  . Alkaline Phosphatase 06/07/2019 104  38 - 126  U/L Final  . Total Bilirubin 06/07/2019 0.7  0.3 - 1.2 mg/dL Final  . GFR calc non Af Amer 06/07/2019 >60  >60 mL/min Final  . GFR calc Af Amer 06/07/2019 >60  >60 mL/min Final  . Anion gap 06/07/2019 9  5 - 15 Final   Performed at [facility], [address], [place],  [address]    Assessment:  Katie Thompson is a 73 y.o. female with left breast DCIS s/p lumpectomy on 02/15/2013. Pathology revealed a 16 mm area of grade II residual ductal carcinoma in situ, cribriform type.  Margins were clear. DCIS was ER 75%, and PR 25%.  Pathologic stage was pTis pNx.  She received accelerated partial breast irradiation, 3400 cGy in 10 fractions at 340 cGy twice a day.  She received tamoxifen from 03/2013 - 06/2018.   Bilateral screening mammogram on 12/28/2018 revealed no evidence of malignancy.   Symptomatically, she is doing well.  Exam reveals post-operative and post-radiation changes.  Plan: 1. Labs today: CBC with diff, CMP.  2. Left breast DCIS  Review entire medical history, diagnosis and management of DICS.   Schedule bilateral mammogram on 12/29/2019.  Discuss ongoing yearly surveillance. 3.   RTC in 1 year for MD assessment and labs (CBC with diff, CMP).  I discussed the assessment and treatment plan with the patient.  The patient was provided an opportunity to ask questions and all were answered.  The patient agreed with the plan and demonstrated an understanding of the instructions.  The patient was advised to call back if the symptoms worsen or if the condition fails to improve as anticipated.   Lequita Asal, MD, PhD    06/07/2019, 2:18 PM

## 2019-06-07 ENCOUNTER — Ambulatory Visit: Payer: BLUE CROSS/BLUE SHIELD | Admitting: Hematology and Oncology

## 2019-06-07 ENCOUNTER — Inpatient Hospital Stay: Payer: BC Managed Care – PPO | Attending: Hematology and Oncology

## 2019-06-07 ENCOUNTER — Inpatient Hospital Stay (HOSPITAL_BASED_OUTPATIENT_CLINIC_OR_DEPARTMENT_OTHER): Payer: BC Managed Care – PPO | Admitting: Hematology and Oncology

## 2019-06-07 ENCOUNTER — Encounter: Payer: Self-pay | Admitting: Hematology and Oncology

## 2019-06-07 ENCOUNTER — Other Ambulatory Visit: Payer: BLUE CROSS/BLUE SHIELD

## 2019-06-07 VITALS — BP 136/66 | HR 56 | Temp 96.8°F | Resp 18 | Ht 63.0 in | Wt 188.3 lb

## 2019-06-07 DIAGNOSIS — D0512 Intraductal carcinoma in situ of left breast: Secondary | ICD-10-CM

## 2019-06-07 DIAGNOSIS — Z853 Personal history of malignant neoplasm of breast: Secondary | ICD-10-CM | POA: Diagnosis not present

## 2019-06-07 LAB — COMPREHENSIVE METABOLIC PANEL
ALT: 10 U/L (ref 0–44)
AST: 17 U/L (ref 15–41)
Albumin: 3.8 g/dL (ref 3.5–5.0)
Alkaline Phosphatase: 104 U/L (ref 38–126)
Anion gap: 9 (ref 5–15)
BUN: 19 mg/dL (ref 8–23)
CO2: 23 mmol/L (ref 22–32)
Calcium: 9.4 mg/dL (ref 8.9–10.3)
Chloride: 105 mmol/L (ref 98–111)
Creatinine, Ser: 0.89 mg/dL (ref 0.44–1.00)
GFR calc Af Amer: >60 mL/min (ref >60)
GFR calc non Af Amer: >60 mL/min (ref >60)
Glucose, Bld: 113 mg/dL — ABNORMAL HIGH (ref 70–99)
Potassium: 3.7 mmol/L (ref 3.5–5.1)
Sodium: 137 mmol/L (ref 135–145)
Total Bilirubin: 0.7 mg/dL (ref 0.3–1.2)
Total Protein: 8 g/dL (ref 6.5–8.1)

## 2019-06-07 LAB — CBC WITH DIFFERENTIAL/PLATELET
Abs Immature Granulocytes: 0.02 10*3/uL (ref 0.00–0.07)
Basophils Absolute: 0.1 10*3/uL (ref 0.0–0.1)
Basophils Relative: 1 %
Eosinophils Absolute: 0.1 10*3/uL (ref 0.0–0.5)
Eosinophils Relative: 1 %
HCT: 36.1 % (ref 36.0–46.0)
Hemoglobin: 11.8 g/dL — ABNORMAL LOW (ref 12.0–15.0)
Immature Granulocytes: 0 %
Lymphocytes Relative: 39 %
Lymphs Abs: 2.6 10*3/uL (ref 0.7–4.0)
MCH: 27.3 pg (ref 26.0–34.0)
MCHC: 32.7 g/dL (ref 30.0–36.0)
MCV: 83.4 fL (ref 80.0–100.0)
Monocytes Absolute: 0.6 10*3/uL (ref 0.1–1.0)
Monocytes Relative: 8 %
Neutro Abs: 3.3 10*3/uL (ref 1.7–7.7)
Neutrophils Relative %: 51 %
Platelets: 300 10*3/uL (ref 150–400)
RBC: 4.33 MIL/uL (ref 3.87–5.11)
RDW: 16.8 % — ABNORMAL HIGH (ref 11.5–15.5)
WBC: 6.7 10*3/uL (ref 4.0–10.5)
nRBC: 0 % (ref 0.0–0.2)

## 2019-06-07 NOTE — Progress Notes (Signed)
No new changes noted today 

## 2019-12-29 ENCOUNTER — Ambulatory Visit
Admission: RE | Admit: 2019-12-29 | Discharge: 2019-12-29 | Disposition: A | Discharge status: Home / Self Care | Payer: BC Managed Care – PPO | Source: Ambulatory Visit | Attending: Hematology and Oncology | Admitting: Hematology and Oncology

## 2019-12-29 ENCOUNTER — Other Ambulatory Visit: Payer: Self-pay

## 2019-12-29 ENCOUNTER — Encounter (INDEPENDENT_AMBULATORY_CARE_PROVIDER_SITE_OTHER): Payer: Self-pay

## 2019-12-29 DIAGNOSIS — D0512 Intraductal carcinoma in situ of left breast: Secondary | ICD-10-CM

## 2019-12-29 DIAGNOSIS — Z1231 Encounter for screening mammogram for malignant neoplasm of breast: Secondary | ICD-10-CM | POA: Insufficient documentation

## 2019-12-29 DIAGNOSIS — Z86000 Personal history of in-situ neoplasm of breast: Secondary | ICD-10-CM | POA: Diagnosis not present

## 2020-05-31 ENCOUNTER — Other Ambulatory Visit: Payer: Self-pay

## 2020-05-31 DIAGNOSIS — D0512 Intraductal carcinoma in situ of left breast: Secondary | ICD-10-CM

## 2020-06-04 NOTE — Progress Notes (Addendum)
[facility]  [address], [address] [place], [place] [address] Phone: [phone]  Fax: [phone]   Clinic Day:  06/05/2020  Referring physician: Baxter Hire, MD  Chief Complaint: Katie Thompson is a 74 y.o. female with left breast DCIS who is seen for 1 year assessment.   HPI:  The patient was last seen in the medical oncology clinic on 06/07/2019 for new patient assessment.  She was s/p lumpectomy in 02/2013 for a 16 mm area of grade II residual DCIS, cribriform type.  Margins were clear.  DCIS was ER and PR positive.  She had received accelerated partial breast radiation.  She had been on tamoxifen for 5 years (completed 06/2018).  At that time, she was doing well.  Exam revealed post-operative and post-radiation changes.  We discussed annual surveillance.  Bilateral screening mammogram on 12/29/2019 revealed no mammographic evidence of malignancy.   During the interim, she has felt "alright".  She has not had her colonoscopy.  She notes a follow-up in October.  She sees her primary care physician at that time.  She denies any breast concerns.  She received her COVID-19 vaccine in 10/2019.   Past Medical History:  Diagnosis Date  . Breast cancer ([place]) 2014   LT with radiation  . Diverticulosis feb. 2015   from colonoscopy  . GERD (gastroesophageal reflux disease)   . Hyperlipidemia   . Hypertension   . Personal history of radiation therapy 2014   DCIS left breast    Past Surgical History:  Procedure Laterality Date  . APPENDECTOMY    . BREAST BIOPSY Left 2016   coil marker, BENIGN BREAST TISSUE WITH CLUSTERED MICROCYSTS AND STROMAL FIBROSIS.   Marland Kitchen BREAST BIOPSY Left 01/05/2018   Affirm Bx- x marker, FIBROCYSTIC CHANGE WITH MICROCALCIFICATIONS  . BREAST BIOPSY Left 02/04/2013   DCIS  . BREAST EXCISIONAL BIOPSY Left 07/29/2017   SCLEROTIC INTRADUCTAL PAPILLOMA.  Marland Kitchen BREAST LUMPECTOMY Left 2014   DCIS with clear margins.   . BTL    .  EXCISION OF BREAST LESION Left 07/29/2017   Procedure: EXCISION OF BREAST MASS;  Surgeon: Leonie Green, MD;  Location: [facility] ORS;  Service: General;  Laterality: Left;  . HERNIA REPAIR    . NASAL SINUS SURGERY      Family History  Problem Relation Age of Onset  . Ovarian cancer Mother   . Hypertension Mother   . Diabetes Paternal Grandmother   . Hypertension Paternal Grandmother   . Breast cancer Paternal Aunt 62    Social History:  reports that she has never smoked. She has never used smokeless tobacco. She reports that she does not drink alcohol and does not use drugs.  She denies any exposure to radiation or toxins.  She works at [employer] full-time.  She lives in [place].  The patient is alone today.  Allergies:  Allergies  Allergen Reactions  . Codeine Nausea And Vomiting  . Gatifloxacin Hives    Current Medications: Current Outpatient Medications  Medication Sig Dispense Refill  . aspirin EC 81 MG tablet Take 81 mg by mouth daily.     Marland Kitchen atorvastatin (LIPITOR) 10 MG tablet Take 10 mg by mouth daily.     . calcium carbonate (TUMS - DOSED IN MG ELEMENTAL CALCIUM) 500 MG chewable tablet Chew 1 tablet by mouth 2 (two) times daily.    . Cholecalciferol 10000 units CAPS Take 1 capsule by mouth daily.    . dorzolamide (TRUSOPT) 2 % ophthalmic  solution Place 1 drop into both eyes 2 (two) times daily.    . ergocalciferol (VITAMIN D2) 50000 units capsule Take 50,000 Units by mouth every Monday.    . losartan-hydrochlorothiazide (HYZAAR) 50-12.5 MG tablet Take 1 tablet by mouth daily.     Earney Navy Bicarbonate (ZEGERID OTC) 20-1100 MG CAPS capsule Take 1 capsule by mouth daily before breakfast.    . verapamil (CALAN-SR) 240 MG CR tablet Take 240 mg by mouth daily.     Marland Kitchen HYDROcodone-acetaminophen (NORCO) 5-325 MG tablet Take 1 tablet by mouth every 4 (four) hours as needed for moderate pain. (Patient not taking: Reported on 06/07/2019) 10 tablet 0  . tamoxifen  (NOLVADEX) 20 MG tablet Take 1 tablet (20 mg total) by mouth daily. (Patient not taking: Reported on 06/07/2019) 30 tablet 12   No current facility-administered medications for this visit.    Review of Systems  Constitutional: Positive for weight loss (4 pounds). Negative for chills, diaphoresis, fever and malaise/fatigue.       Feels "alright".  HENT: Negative.  Negative for congestion, ear pain, hearing loss, nosebleeds, sinus pain and sore throat.   Eyes: Negative.  Negative for blurred vision, double vision, photophobia and pain.       Vision is fine.  She got new glasses.  Respiratory: Negative.  Negative for cough, sputum production, shortness of breath and wheezing.   Cardiovascular: Negative.  Negative for chest pain, palpitations, orthopnea and leg swelling.  Gastrointestinal: Negative.  Negative for abdominal pain, blood in stool, constipation, diarrhea, melena, nausea and vomiting.       Colonoscopy has not been scheduled yet.  Genitourinary: Negative.  Negative for dysuria, frequency, hematuria and urgency.  Musculoskeletal: Positive for joint pain. Negative for back pain and myalgias.  Skin: Negative.  Negative for rash.  Neurological: Negative.  Negative for dizziness, tingling, sensory change, focal weakness, weakness and headaches.  Endo/Heme/Allergies: Negative.  Does not bruise/bleed easily.  Psychiatric/Behavioral: Negative.  Negative for depression, memory loss and substance abuse. The patient is not nervous/anxious and does not have insomnia.   All other systems reviewed and are negative.  Performance status (ECOG): 1  Vitals Blood pressure (!) 159/77, pulse 60, temperature (!) 96.4 F (35.8 C), temperature source Tympanic, weight 184 lb 3.1 oz (83.6 kg), SpO2 100 %.   Physical Exam Vitals and nursing note reviewed.  Constitutional:      General: She is not in acute distress.    Appearance: She is well-developed. She is not ill-appearing or diaphoretic.  HENT:      Head: Normocephalic and atraumatic.     Comments: Graying hair pulled up.    Mouth/Throat:     Pharynx: No oropharyngeal exudate or posterior oropharyngeal erythema.  Eyes:     General: No scleral icterus.    Conjunctiva/sclera: Conjunctivae normal.     Pupils: Pupils are equal, round, and reactive to light.     Comments: Glasses.  Brown eyes.  Neck:     Vascular: No JVD.  Cardiovascular:     Rate and Rhythm: Normal rate and regular rhythm.     Heart sounds: Normal heart sounds. No murmur heard.  No gallop.   Pulmonary:     Effort: Pulmonary effort is normal. No respiratory distress.     Breath sounds: Normal breath sounds. No wheezing.  Chest:     Breasts:        Right: No inverted nipple, mass, nipple discharge, skin change (fibrocystic changes) or tenderness.  Left: No inverted nipple, mass, nipple discharge, skin change (lumpectomy with lateral post operative changes) or tenderness.  Abdominal:     General: Bowel sounds are normal. There is no distension.     Palpations: Abdomen is soft. There is no mass.     Tenderness: There is no abdominal tenderness. There is no guarding or rebound.  Musculoskeletal:        General: No swelling. Normal range of motion.     Cervical back: Normal range of motion and neck supple.  Lymphadenopathy:     Cervical: No cervical adenopathy.     Upper Body:     Right upper body: No supraclavicular adenopathy.     Left upper body: No supraclavicular adenopathy.  Skin:    General: Skin is warm and dry.     Coloration: Skin is not pale.     Findings: No bruising, erythema or rash.  Neurological:     Mental Status: She is alert and oriented to person, place, and time.  Psychiatric:        Behavior: Behavior normal.        Thought Content: Thought content normal.        Judgment: Judgment normal.    Appointment on 06/05/2020  Component Date Value Ref Range Status  . Sodium 06/05/2020 140  135 - 145 mmol/L Final  . Potassium  06/05/2020 4.3  3.5 - 5.1 mmol/L Final  . Chloride 06/05/2020 107  98 - 111 mmol/L Final  . CO2 06/05/2020 26  22 - 32 mmol/L Final  . Glucose, Bld 06/05/2020 99  70 - 99 mg/dL Final   Glucose reference range applies only to samples taken after fasting for at least 8 hours.  . BUN 06/05/2020 18  8 - 23 mg/dL Final  . Creatinine, Ser 06/05/2020 0.83  0.44 - 1.00 mg/dL Final  . Calcium 06/05/2020 9.2  8.9 - 10.3 mg/dL Final  . Total Protein 06/05/2020 7.7  6.5 - 8.1 g/dL Final  . Albumin 06/05/2020 3.7  3.5 - 5.0 g/dL Final  . AST 06/05/2020 18  15 - 41 U/L Final  . ALT 06/05/2020 12  0 - 44 U/L Final  . Alkaline Phosphatase 06/05/2020 80  38 - 126 U/L Final  . Total Bilirubin 06/05/2020 0.8  0.3 - 1.2 mg/dL Final  . GFR calc non Af Amer 06/05/2020 >60  >60 mL/min Final  . GFR calc Af Amer 06/05/2020 >60  >60 mL/min Final  . Anion gap 06/05/2020 7  5 - 15 Final   Performed at [facility], [address], [place], [place] [address]  . WBC 06/05/2020 6.2  4.0 - 10.5 K/uL Final  . RBC 06/05/2020 4.18  3.87 - 5.11 MIL/uL Final  . Hemoglobin 06/05/2020 11.9* 12.0 - 15.0 g/dL Final  . HCT 06/05/2020 35.8* 36 - 46 % Final  . MCV 06/05/2020 85.6  80.0 - 100.0 fL Final  . MCH 06/05/2020 28.5  26.0 - 34.0 pg Final  . MCHC 06/05/2020 33.2  30.0 - 36.0 g/dL Final  . RDW 06/05/2020 16.4* 11.5 - 15.5 % Final  . Platelets 06/05/2020 294  150 - 400 K/uL Final  . nRBC 06/05/2020 0.0  0.0 - 0.2 % Final  . Neutrophils Relative % 06/05/2020 48  % Final  . Neutro Abs 06/05/2020 3.0  1.7 - 7.7 K/uL Final  . Lymphocytes Relative 06/05/2020 40  % Final  . Lymphs Abs 06/05/2020 2.5  0.7 - 4.0 K/uL Final  .  Monocytes Relative 06/05/2020 9  % Final  . Monocytes Absolute 06/05/2020 0.5  0 - 1 K/uL Final  . Eosinophils Relative 06/05/2020 2  % Final  . Eosinophils Absolute 06/05/2020 0.1  0 - 0 K/uL Final  . Basophils Relative 06/05/2020 1  % Final  . Basophils Absolute 06/05/2020 0.1  0 - 0 K/uL  Final  . Immature Granulocytes 06/05/2020 0  % Final  . Abs Immature Granulocytes 06/05/2020 0.01  0.00 - 0.07 K/uL Final   Performed at [facility], [address] [place], [place] [address]    Assessment:  Katie Thompson is a 74 y.o. female with left breast DCIS s/p lumpectomy on 02/15/2013. Pathology revealed a 16 mm area of grade II residual ductal carcinoma in situ, cribriform type.  Margins were clear. DCIS was ER 75%, and PR 25%.  Pathologic stage was pTis pNx.  She received accelerated partial breast irradiation, 3400 cGy in 10 fractions at 340 cGy twice a day.  She received tamoxifen from 03/2013 - 06/2018.   Bilateral screening mammogram on 12/28/2018 revealed no evidence of malignancy. Bilateral screening mammogram on 12/29/2019 revealed no mammographic evidence of malignancy.   She received her COVID-19 vaccine in 10/2019.  Symptomatically, she is doing well.  Exam is stable.  Plan: 1. Labs today: CBC with diff, CMP.  2. Left breast DCIS  She is s/p lumpectomy in 02/2013.   She completed radiation and tamoxifen x 5 years.  Clinically, she is doing well.  Exam is stable doubt evidence of recurrence  Mammogram 12/29/2019 revealed no evidence of malignancy.  Continue surveillance. 3.   Bilateral screening mammogram 12/28/2020. 4.   RTC in 1 year for MD assessment, labs (CBC with diff, CMP), and review of mammogram.  I discussed the assessment and treatment plan with the patient.  The patient was provided an opportunity to ask questions and all were answered.  The patient agreed with the plan and demonstrated an understanding of the instructions.  The patient was advised to call back if the symptoms worsen or if the condition fails to improve as anticipated.   Lequita Asal, MD, PhD    06/05/2020, 9:52 AM   I, Jacqualyn Posey, am acting as a Education administrator for [employer]. Mike Gip, MD.   I, Kitai Purdom C. Mike Gip, MD, have reviewed the above documentation for accuracy  and completeness, and I agree with the above.

## 2020-06-05 ENCOUNTER — Other Ambulatory Visit: Payer: BC Managed Care – PPO

## 2020-06-05 ENCOUNTER — Other Ambulatory Visit: Payer: Self-pay

## 2020-06-05 ENCOUNTER — Inpatient Hospital Stay: Payer: BC Managed Care – PPO | Attending: Hematology and Oncology

## 2020-06-05 ENCOUNTER — Ambulatory Visit: Payer: BC Managed Care – PPO | Admitting: Hematology and Oncology

## 2020-06-05 ENCOUNTER — Encounter: Payer: Self-pay | Admitting: Hematology and Oncology

## 2020-06-05 ENCOUNTER — Inpatient Hospital Stay (HOSPITAL_BASED_OUTPATIENT_CLINIC_OR_DEPARTMENT_OTHER): Payer: BC Managed Care – PPO | Admitting: Hematology and Oncology

## 2020-06-05 VITALS — BP 159/77 | HR 60 | Temp 96.4°F | Wt 184.2 lb

## 2020-06-05 DIAGNOSIS — D0512 Intraductal carcinoma in situ of left breast: Secondary | ICD-10-CM

## 2020-06-05 DIAGNOSIS — Z8041 Family history of malignant neoplasm of ovary: Secondary | ICD-10-CM | POA: Diagnosis not present

## 2020-06-05 DIAGNOSIS — Z923 Personal history of irradiation: Secondary | ICD-10-CM | POA: Diagnosis not present

## 2020-06-05 DIAGNOSIS — Z86000 Personal history of in-situ neoplasm of breast: Secondary | ICD-10-CM | POA: Diagnosis not present

## 2020-06-05 DIAGNOSIS — Z803 Family history of malignant neoplasm of breast: Secondary | ICD-10-CM | POA: Insufficient documentation

## 2020-06-05 LAB — CBC WITH DIFFERENTIAL/PLATELET
Abs Immature Granulocytes: 0.01 10*3/uL (ref 0.00–0.07)
Basophils Absolute: 0.1 10*3/uL (ref 0.0–0.1)
Basophils Relative: 1 %
Eosinophils Absolute: 0.1 10*3/uL (ref 0.0–0.5)
Eosinophils Relative: 2 %
HCT: 35.8 % — ABNORMAL LOW (ref 36.0–46.0)
Hemoglobin: 11.9 g/dL — ABNORMAL LOW (ref 12.0–15.0)
Immature Granulocytes: 0 %
Lymphocytes Relative: 40 %
Lymphs Abs: 2.5 10*3/uL (ref 0.7–4.0)
MCH: 28.5 pg (ref 26.0–34.0)
MCHC: 33.2 g/dL (ref 30.0–36.0)
MCV: 85.6 fL (ref 80.0–100.0)
Monocytes Absolute: 0.5 10*3/uL (ref 0.1–1.0)
Monocytes Relative: 9 %
Neutro Abs: 3 10*3/uL (ref 1.7–7.7)
Neutrophils Relative %: 48 %
Platelets: 294 10*3/uL (ref 150–400)
RBC: 4.18 MIL/uL (ref 3.87–5.11)
RDW: 16.4 % — ABNORMAL HIGH (ref 11.5–15.5)
WBC: 6.2 10*3/uL (ref 4.0–10.5)
nRBC: 0 % (ref 0.0–0.2)

## 2020-06-05 LAB — COMPREHENSIVE METABOLIC PANEL
ALT: 12 U/L (ref 0–44)
AST: 18 U/L (ref 15–41)
Albumin: 3.7 g/dL (ref 3.5–5.0)
Alkaline Phosphatase: 80 U/L (ref 38–126)
Anion gap: 7 (ref 5–15)
BUN: 18 mg/dL (ref 8–23)
CO2: 26 mmol/L (ref 22–32)
Calcium: 9.2 mg/dL (ref 8.9–10.3)
Chloride: 107 mmol/L (ref 98–111)
Creatinine, Ser: 0.83 mg/dL (ref 0.44–1.00)
GFR calc Af Amer: >60 mL/min (ref >60)
GFR calc non Af Amer: >60 mL/min (ref >60)
Glucose, Bld: 99 mg/dL (ref 70–99)
Potassium: 4.3 mmol/L (ref 3.5–5.1)
Sodium: 140 mmol/L (ref 135–145)
Total Bilirubin: 0.8 mg/dL (ref 0.3–1.2)
Total Protein: 7.7 g/dL (ref 6.5–8.1)

## 2020-10-12 ENCOUNTER — Telehealth: Payer: Self-pay | Admitting: Hematology and Oncology

## 2020-10-20 NOTE — Telephone Encounter (Signed)
Pt. called so she can get her mammogram appt. She requested appt. to be sent by postal mail. Done.  By Aubery Lapping

## 2021-01-01 ENCOUNTER — Ambulatory Visit
Admission: RE | Admit: 2021-01-01 | Discharge: 2021-01-01 | Disposition: A | Discharge status: Home / Self Care | Payer: BC Managed Care – PPO | Source: Ambulatory Visit | Attending: Hematology and Oncology | Admitting: Hematology and Oncology

## 2021-01-01 ENCOUNTER — Other Ambulatory Visit: Payer: Self-pay

## 2021-01-01 DIAGNOSIS — Z853 Personal history of malignant neoplasm of breast: Secondary | ICD-10-CM | POA: Insufficient documentation

## 2021-01-01 DIAGNOSIS — D0512 Intraductal carcinoma in situ of left breast: Secondary | ICD-10-CM

## 2021-01-01 DIAGNOSIS — Z1231 Encounter for screening mammogram for malignant neoplasm of breast: Secondary | ICD-10-CM | POA: Insufficient documentation

## 2021-01-02 ENCOUNTER — Other Ambulatory Visit: Payer: Self-pay | Admitting: Hematology and Oncology

## 2021-01-02 DIAGNOSIS — N6489 Other specified disorders of breast: Secondary | ICD-10-CM

## 2021-01-02 DIAGNOSIS — R928 Other abnormal and inconclusive findings on diagnostic imaging of breast: Secondary | ICD-10-CM

## 2021-01-10 NOTE — Progress Notes (Signed)
  Please call Katie Thompson re:  diagnostic mammogram and ultrasound.  M

## 2021-01-10 NOTE — Progress Notes (Signed)
Dr. Crissie Figures will resubmit this to your inbasket to cosign the order.

## 2021-01-11 ENCOUNTER — Other Ambulatory Visit: Payer: Self-pay | Admitting: Internal Medicine

## 2021-01-11 DIAGNOSIS — N6489 Other specified disorders of breast: Secondary | ICD-10-CM

## 2021-01-11 DIAGNOSIS — R928 Other abnormal and inconclusive findings on diagnostic imaging of breast: Secondary | ICD-10-CM

## 2021-01-11 NOTE — Progress Notes (Signed)
  Do you know if this is resolved?  M

## 2021-01-12 NOTE — Progress Notes (Signed)
Aldona Bar - please follow-up on Dr. Mike Gip if the note is signed yet or not.

## 2021-01-18 ENCOUNTER — Other Ambulatory Visit: Payer: Self-pay

## 2021-01-18 ENCOUNTER — Ambulatory Visit
Admission: RE | Admit: 2021-01-18 | Discharge: 2021-01-18 | Disposition: A | Discharge status: Home / Self Care | Payer: BC Managed Care – PPO | Source: Ambulatory Visit | Attending: Internal Medicine | Admitting: Internal Medicine

## 2021-01-18 DIAGNOSIS — N6489 Other specified disorders of breast: Secondary | ICD-10-CM | POA: Insufficient documentation

## 2021-01-18 DIAGNOSIS — R928 Other abnormal and inconclusive findings on diagnostic imaging of breast: Secondary | ICD-10-CM | POA: Insufficient documentation

## 2021-01-22 ENCOUNTER — Other Ambulatory Visit: Payer: Self-pay | Admitting: Internal Medicine

## 2021-01-22 DIAGNOSIS — R928 Other abnormal and inconclusive findings on diagnostic imaging of breast: Secondary | ICD-10-CM

## 2021-01-22 DIAGNOSIS — N6320 Unspecified lump in the left breast, unspecified quadrant: Secondary | ICD-10-CM

## 2021-01-29 ENCOUNTER — Ambulatory Visit
Admission: RE | Admit: 2021-01-29 | Discharge: 2021-01-29 | Disposition: A | Discharge status: Home / Self Care | Payer: BC Managed Care – PPO | Source: Ambulatory Visit | Attending: Internal Medicine | Admitting: Internal Medicine

## 2021-01-29 ENCOUNTER — Other Ambulatory Visit: Payer: Self-pay

## 2021-01-29 DIAGNOSIS — R928 Other abnormal and inconclusive findings on diagnostic imaging of breast: Secondary | ICD-10-CM | POA: Insufficient documentation

## 2021-01-29 DIAGNOSIS — N6320 Unspecified lump in the left breast, unspecified quadrant: Secondary | ICD-10-CM | POA: Insufficient documentation

## 2021-01-29 HISTORY — PX: BREAST BIOPSY: SHX20

## 2021-01-30 LAB — SURGICAL PATHOLOGY

## 2021-02-01 NOTE — Progress Notes (Signed)
Breast biopsy resulted in intraductal papilloma with surgical consult recommended.  Scheduled with Dr. Peyton Najjar per patient request on 02/08/21 at 10:15.

## 2021-02-14 ENCOUNTER — Other Ambulatory Visit: Payer: Self-pay | Admitting: General Surgery

## 2021-02-14 ENCOUNTER — Ambulatory Visit: Payer: Self-pay | Admitting: General Surgery

## 2021-02-14 DIAGNOSIS — D242 Benign neoplasm of left breast: Secondary | ICD-10-CM

## 2021-02-15 ENCOUNTER — Ambulatory Visit: Payer: Self-pay | Admitting: General Surgery

## 2021-02-15 NOTE — H&P (View-Only) (Signed)
HISTORY OF PRESENT ILLNESS:    [name] is a 75 y.o.female patient who comes for evaluation of left breast asymmetry.  Patient had a left breast asymmetry on screening mammogram.  This led to diagnostic mammogram and ultrasound.  This showed a small asymmetry confirm that was biopsied.  Core biopsy showed intraductal papilloma with sclerosing features.  Patient denies any nipple discharge.  Patient denies any skin changes or palpable masses.  Patient has a history of breast cancer.  Patient has previous history of intraductal papilloma that were benign.  Patient denies any pain.  There is no pain radiation.  There is no alleviating or aggravating factors.      PAST MEDICAL HISTORY:  Past Medical History:  Diagnosis Date  . Cancer (CMS-HCC)    DCIS 2014, s/p lumpectomy ([facility]) radiation and on Tamoxifen ([facility])  . Diabetes mellitus type 2, uncomplicated (CMS-HCC)   . H/O bacterial meningitis 12/2005   and CNS leak through ethmoid sinus  . Hyperlipidemia   . Hypertension   . Osteopenia determined by x-ray 08/2014  . Reflux         PAST SURGICAL HISTORY:   Past Surgical History:  Procedure Laterality Date  . APPENDECTOMY    . BREAST SURGERY     Lumpectomy for DCIS (Dr Tamala Julian)  . OTHER SURGERY     hernia repair  . Sinus surgery    . TUBAL LIGATION           MEDICATIONS:  Outpatient Encounter Medications as of 02/13/2021  Medication Sig Dispense Refill  . aspirin 81 MG EC tablet Take 1 tablet (81 mg total) by mouth once daily. 30 tablet 11  . atorvastatin (LIPITOR) 10 MG tablet Take 1 tablet (10 mg total) by mouth once daily 30 tablet 11  . calcium carbonate 500 mg calcium (1,250 mg) tablet Take 500 mg of elemental by mouth 2 (two) times daily with meals.    . cholecalciferol (VITAMIN D3) 2,000 unit capsule TAKE 1 CAPSULE BY MOUTH EVERY DAY 30 capsule 5  . losartan-hydrochlorothiazide (HYZAAR) 100-12.5 mg tablet Take 1 tablet by mouth once daily 30 tablet 11  . naproxen  (NAPROSYN) 500 MG tablet Take 1 tablet (500 mg total) by mouth 2 (two) times daily with meals for 30 days 28 tablet 0  . verapamiL (CALAN-SR) 240 MG SR tablet Take 1 tablet (240 mg total) by mouth once daily 30 tablet 11   No facility-administered encounter medications on file as of 02/13/2021.     ALLERGIES:   Tequin [gatifloxacin in d5w] and Codeine   SOCIAL HISTORY:  Social History   Socioeconomic History  . Marital status: Married  Tobacco Use  . Smoking status: Never Smoker  . Smokeless tobacco: Never Used  Vaping Use  . Vaping Use: Never used  Substance and Sexual Activity  . Alcohol use: No  . Drug use: No  . Sexual activity: Yes    Partners: Male    Birth control/protection: None, Surgical, Post-menopausal  Social History Narrative   Married, 3 kids, 6 grandkids   Working at [employer]   No tob no etoh       FAMILY HISTORY:  Family History  Problem Relation Age of Onset  . High blood pressure (Hypertension) Mother   . Uterine cancer Mother      GENERAL REVIEW OF SYSTEMS:   General ROS: negative for - chills, fatigue, fever, weight gain or weight loss Allergy and Immunology ROS: negative for - hives  Hematological and Lymphatic  ROS: negative for - bleeding problems or bruising, negative for palpable nodes Endocrine ROS: negative for - heat or cold intolerance, hair changes Respiratory ROS: negative for - cough, shortness of breath or wheezing Cardiovascular ROS: no chest pain or palpitations GI ROS: negative for nausea, vomiting, abdominal pain, diarrhea, constipation Musculoskeletal ROS: negative for - joint swelling or muscle pain Neurological ROS: negative for - confusion, syncope Dermatological ROS: negative for pruritus and rash  PHYSICAL EXAM:  Vitals:   02/13/21 1458  BP: (!) 167/84  Pulse: 65  .  Ht:160 cm (5\' 3" ) Wt:82.1 kg (181 lb) PPI:RJJO surface area is 1.91 meters squared. Body mass index is 32.06 kg/m.Marland Kitchen   GENERAL: Alert, active, oriented  x3  HEENT: Pupils equal reactive to light. Extraocular movements are intact. Sclera clear. Palpebral conjunctiva normal red color.Pharynx clear.  NECK: Supple with no palpable mass and no adenopathy.  LUNGS: Sound clear with no rales rhonchi or wheezes.  HEART: Regular rhythm S1 and S2 without murmur.  BREAST: Both breast were examined in the sitting and supine position there was no palpable masses, no skin changes, no nipple retraction.  There was no axillary adenopathy.  ABDOMEN: Soft and depressible, nontender with no palpable mass, no hepatomegaly.   EXTREMITIES: Well-developed well-nourished symmetrical with no dependent edema.  NEUROLOGICAL: Awake alert oriented, facial expression symmetrical, moving all extremities.      IMPRESSION:     Intraductal papilloma of breast, left [D24.2] Patient was oriented about the core biopsy results that shows intraductal papilloma.  Surgical excision was recommended.  Patient was oriented about the fact that she has had previous surgery before for breast cancer.  She was also oriented that the goal of surgery is to rule out malignancy.  This is not a treatment for cancer at this moment.  There is no indication for sentinel node biopsy.  Patient was oriented about the surgery of excisional biopsy and the risks that include wound infection, bleeding, seroma, pain, cosmetic results, thrombophlebitis, among others.  The patient reports he understood and agreed to proceed.          PLAN:  1. Left breast radiofrequency tagged excisional biopsy (19125) 2. CBC, CMP done 3. Hold aspirin 5 days before surgery 4. Contact us if you have any question or concern.   Patient verbalized understanding, all questions were answered, and were agreeable with the plan outlined above.   Herbert Pun, MD  Electronically signed by Herbert Pun, MD

## 2021-02-15 NOTE — H&P (Signed)
HISTORY OF PRESENT ILLNESS:    [name] is a 75 y.o.female patient who comes for evaluation of left breast asymmetry.  Patient had a left breast asymmetry on screening mammogram.  This led to diagnostic mammogram and ultrasound.  This showed a small asymmetry confirm that was biopsied.  Core biopsy showed intraductal papilloma with sclerosing features.  Patient denies any nipple discharge.  Patient denies any skin changes or palpable masses.  Patient has a history of breast cancer.  Patient has previous history of intraductal papilloma that were benign.  Patient denies any pain.  There is no pain radiation.  There is no alleviating or aggravating factors.      PAST MEDICAL HISTORY:  Past Medical History:  Diagnosis Date  . Cancer (CMS-HCC)    DCIS 2014, s/p lumpectomy ([facility]) radiation and on Tamoxifen ([facility])  . Diabetes mellitus type 2, uncomplicated (CMS-HCC)   . H/O bacterial meningitis 12/2005   and CNS leak through ethmoid sinus  . Hyperlipidemia   . Hypertension   . Osteopenia determined by x-ray 08/2014  . Reflux         PAST SURGICAL HISTORY:   Past Surgical History:  Procedure Laterality Date  . APPENDECTOMY    . BREAST SURGERY     Lumpectomy for DCIS (Dr Tamala Julian)  . OTHER SURGERY     hernia repair  . Sinus surgery    . TUBAL LIGATION           MEDICATIONS:  Outpatient Encounter Medications as of 02/13/2021  Medication Sig Dispense Refill  . aspirin 81 MG EC tablet Take 1 tablet (81 mg total) by mouth once daily. 30 tablet 11  . atorvastatin (LIPITOR) 10 MG tablet Take 1 tablet (10 mg total) by mouth once daily 30 tablet 11  . calcium carbonate 500 mg calcium (1,250 mg) tablet Take 500 mg of elemental by mouth 2 (two) times daily with meals.    . cholecalciferol (VITAMIN D3) 2,000 unit capsule TAKE 1 CAPSULE BY MOUTH EVERY DAY 30 capsule 5  . losartan-hydrochlorothiazide (HYZAAR) 100-12.5 mg tablet Take 1 tablet by mouth once daily 30 tablet 11  . naproxen  (NAPROSYN) 500 MG tablet Take 1 tablet (500 mg total) by mouth 2 (two) times daily with meals for 30 days 28 tablet 0  . verapamiL (CALAN-SR) 240 MG SR tablet Take 1 tablet (240 mg total) by mouth once daily 30 tablet 11   No facility-administered encounter medications on file as of 02/13/2021.     ALLERGIES:   Tequin [gatifloxacin in d5w] and Codeine   SOCIAL HISTORY:  Social History   Socioeconomic History  . Marital status: Married  Tobacco Use  . Smoking status: Never Smoker  . Smokeless tobacco: Never Used  Vaping Use  . Vaping Use: Never used  Substance and Sexual Activity  . Alcohol use: No  . Drug use: No  . Sexual activity: Yes    Partners: Male    Birth control/protection: None, Surgical, Post-menopausal  Social History Narrative   Married, 3 kids, 6 grandkids   Working at [employer]   No tob no etoh       FAMILY HISTORY:  Family History  Problem Relation Age of Onset  . High blood pressure (Hypertension) Mother   . Uterine cancer Mother      GENERAL REVIEW OF SYSTEMS:   General ROS: negative for - chills, fatigue, fever, weight gain or weight loss Allergy and Immunology ROS: negative for - hives  Hematological and Lymphatic  ROS: negative for - bleeding problems or bruising, negative for palpable nodes Endocrine ROS: negative for - heat or cold intolerance, hair changes Respiratory ROS: negative for - cough, shortness of breath or wheezing Cardiovascular ROS: no chest pain or palpitations GI ROS: negative for nausea, vomiting, abdominal pain, diarrhea, constipation Musculoskeletal ROS: negative for - joint swelling or muscle pain Neurological ROS: negative for - confusion, syncope Dermatological ROS: negative for pruritus and rash  PHYSICAL EXAM:  Vitals:   02/13/21 1458  BP: (!) 167/84  Pulse: 65  .  Ht:160 cm (5\' 3" ) Wt:82.1 kg (181 lb) PPI:RJJO surface area is 1.91 meters squared. Body mass index is 32.06 kg/m.Marland Kitchen   GENERAL: Alert, active, oriented  x3  HEENT: Pupils equal reactive to light. Extraocular movements are intact. Sclera clear. Palpebral conjunctiva normal red color.Pharynx clear.  NECK: Supple with no palpable mass and no adenopathy.  LUNGS: Sound clear with no rales rhonchi or wheezes.  HEART: Regular rhythm S1 and S2 without murmur.  BREAST: Both breast were examined in the sitting and supine position there was no palpable masses, no skin changes, no nipple retraction.  There was no axillary adenopathy.  ABDOMEN: Soft and depressible, nontender with no palpable mass, no hepatomegaly.   EXTREMITIES: Well-developed well-nourished symmetrical with no dependent edema.  NEUROLOGICAL: Awake alert oriented, facial expression symmetrical, moving all extremities.      IMPRESSION:     Intraductal papilloma of breast, left [D24.2] Patient was oriented about the core biopsy results that shows intraductal papilloma.  Surgical excision was recommended.  Patient was oriented about the fact that she has had previous surgery before for breast cancer.  She was also oriented that the goal of surgery is to rule out malignancy.  This is not a treatment for cancer at this moment.  There is no indication for sentinel node biopsy.  Patient was oriented about the surgery of excisional biopsy and the risks that include wound infection, bleeding, seroma, pain, cosmetic results, thrombophlebitis, among others.  The patient reports he understood and agreed to proceed.          PLAN:  1. Left breast radiofrequency tagged excisional biopsy (19125) 2. CBC, CMP done 3. Hold aspirin 5 days before surgery 4. Contact us if you have any question or concern.   Patient verbalized understanding, all questions were answered, and were agreeable with the plan outlined above.   Herbert Pun, MD  Electronically signed by Herbert Pun, MD

## 2021-02-22 ENCOUNTER — Encounter
Admission: RE | Admit: 2021-02-22 | Discharge: 2021-02-22 | Disposition: A | Discharge status: Home / Self Care | Payer: BC Managed Care – PPO | Source: Ambulatory Visit | Attending: General Surgery | Admitting: General Surgery

## 2021-02-22 ENCOUNTER — Other Ambulatory Visit: Payer: Self-pay

## 2021-02-22 DIAGNOSIS — Z01818 Encounter for other preprocedural examination: Secondary | ICD-10-CM | POA: Diagnosis not present

## 2021-02-22 DIAGNOSIS — I10 Essential (primary) hypertension: Secondary | ICD-10-CM | POA: Diagnosis not present

## 2021-02-22 HISTORY — DX: Personal history of other diseases of the digestive system: Z87.19

## 2021-02-22 HISTORY — DX: Type 2 diabetes mellitus without complications: E11.9

## 2021-02-22 HISTORY — DX: Unspecified osteoarthritis, unspecified site: M19.90

## 2021-02-22 NOTE — Patient Instructions (Addendum)
Your procedure is scheduled on:02-28-21 WEDNESDAY Report to the Registration Desk on the 1st floor of the [facility]-Then proceed to the 2nd floor Surgery Desk in the [facility] To find out your arrival time, please call [phone] between 1PM - 3PM on:02-27-21 TUESDAY  REMEMBER: Instructions that are not followed completely may result in serious medical risk, up to and including death; or upon the discretion of your surgeon and anesthesiologist your surgery may need to be rescheduled.  Do not eat food after midnight the night before surgery.  No gum chewing, lozengers or hard candies.  You may however, drink CLEAR liquids up to 2 hours before you are scheduled to arrive for your surgery. Do not drink anything within 2 hours of your scheduled arrival time.  Clear liquids include: - water  - apple juice without pulp - gatorade - black coffee or tea (Do NOT add milk or creamers to the coffee or tea) Do NOT drink anything that is not on this list.  TAKE THESE MEDICATIONS THE MORNING OF SURGERY WITH A SIP OF WATER: -LIPITOR (ATORVASTATIN) -VERAPAMIL (CALAN-SR) -PRILOSEC (OMEPRAZOLE)-take one the night before and one on the morning of surgery - helps to prevent nausea after surgery.)  STOP 81 MG ASPIRIN 5 DAYS PRIOR TO SURGERY AS INSTRUCTED BY DR CINTRON-DIAZ-LAST DOSE WILL BE TODAY 02-22-21 THURSDAY  One week prior to surgery: Stop Anti-inflammatories (NSAIDS) such as Advil, Aleve, Ibuprofen, Motrin, Naproxen, Naprosyn and Aspirin based products such as Excedrin, Goodys Powder, BC Powder-OK TO TAKE TYLENOL IF NEEDED  Stop ANY OVER THE COUNTER supplements/vitamins NOW (02-22-21) until after surgery.  No Alcohol for 24 hours before or after surgery.  No Smoking including e-cigarettes for 24 hours prior to surgery.  No chewable tobacco products for at least 6 hours prior to surgery.  No nicotine patches on the day of surgery.  Do not use any "recreational" drugs for at least a week  prior to your surgery.  Please be advised that the combination of cocaine and anesthesia may have negative outcomes, up to and including death. If you test positive for cocaine, your surgery will be cancelled.  On the morning of surgery brush your teeth with toothpaste and water, you may rinse your mouth with mouthwash if you wish. Do not swallow any toothpaste or mouthwash.  Do not wear jewelry, make-up, hairpins, clips or nail polish.  Do not wear lotions, powders, or perfumes.   Do not shave body from the neck down 48 hours prior to surgery just in case you cut yourself which could leave a site for infection.  Also, freshly shaved skin may become irritated if using the CHG soap.  Contact lenses, hearing aids and dentures may not be worn into surgery.  Do not bring valuables to the hospital. [facility] is not responsible for any missing/lost belongings or valuables.   Use CHG Soap as directed on instruction sheet.  Notify your doctor if there is any change in your medical condition (cold, fever, infection).  Wear comfortable clothing (specific to your surgery type) to the hospital.  Plan for stool softeners for home use; pain medications have a tendency to cause constipation. You can also help prevent constipation by eating foods high in fiber such as fruits and vegetables and drinking plenty of fluids as your diet allows.  After surgery, you can help prevent lung complications by doing breathing exercises.  Take deep breaths and cough every 1-2 hours. Your doctor may order a device called an Chiropodist  to help you take deep breaths. When coughing or sneezing, hold a pillow firmly against your incision with both hands. This is called "splinting." Doing this helps protect your incision. It also decreases belly discomfort.  If you are being admitted to the hospital overnight, leave your suitcase in the car. After surgery it may be brought to your room.  If you are being  discharged the day of surgery, you will not be allowed to drive home. You will need a responsible adult (18 years or older) to drive you home and stay with you that night.   If you are taking public transportation, you will need to have a responsible adult (18 years or older) with you. Please confirm with your physician that it is acceptable to use public transportation.   Please call the [facility] at [phone] if you have any questions about these instructions.  Surgery Visitation Policy:  Patients undergoing a surgery or procedure may have one family member or support person with them as long as that person is not COVID-19 positive or experiencing its symptoms.  That person may remain in the waiting area during the procedure.  Inpatient Visitation:    Visiting hours are 7 a.m. to 8 p.m. Inpatients will be allowed two visitors daily. The visitors may change each day during the patient's stay. No visitors under the age of 40. Any visitor under the age of 50 must be accompanied by an adult. The visitor must pass COVID-19 screenings, use hand sanitizer when entering and exiting the patient's room and wear a mask at all times, including in the patient's room. Patients must also wear a mask when staff or their visitor are in the room. Masking is required regardless of vaccination status.

## 2021-02-26 ENCOUNTER — Other Ambulatory Visit: Payer: Self-pay

## 2021-02-26 ENCOUNTER — Encounter
Admission: RE | Admit: 2021-02-26 | Discharge: 2021-02-26 | Disposition: A | Discharge status: Home / Self Care | Payer: BC Managed Care – PPO | Source: Ambulatory Visit | Attending: General Surgery | Admitting: General Surgery

## 2021-02-26 ENCOUNTER — Ambulatory Visit
Admission: RE | Admit: 2021-02-26 | Discharge: 2021-02-26 | Disposition: A | Discharge status: Home / Self Care | Payer: BC Managed Care – PPO | Source: Ambulatory Visit | Attending: General Surgery | Admitting: General Surgery

## 2021-02-26 DIAGNOSIS — I10 Essential (primary) hypertension: Secondary | ICD-10-CM | POA: Insufficient documentation

## 2021-02-26 DIAGNOSIS — E119 Type 2 diabetes mellitus without complications: Secondary | ICD-10-CM | POA: Diagnosis not present

## 2021-02-26 DIAGNOSIS — Z01810 Encounter for preprocedural cardiovascular examination: Secondary | ICD-10-CM | POA: Diagnosis not present

## 2021-02-26 DIAGNOSIS — Z791 Long term (current) use of non-steroidal anti-inflammatories (NSAID): Secondary | ICD-10-CM | POA: Diagnosis not present

## 2021-02-26 DIAGNOSIS — Z8249 Family history of ischemic heart disease and other diseases of the circulatory system: Secondary | ICD-10-CM | POA: Diagnosis not present

## 2021-02-26 DIAGNOSIS — Z888 Allergy status to other drugs, medicaments and biological substances status: Secondary | ICD-10-CM | POA: Diagnosis not present

## 2021-02-26 DIAGNOSIS — Z885 Allergy status to narcotic agent status: Secondary | ICD-10-CM | POA: Diagnosis not present

## 2021-02-26 DIAGNOSIS — D242 Benign neoplasm of left breast: Secondary | ICD-10-CM | POA: Diagnosis present

## 2021-02-26 DIAGNOSIS — K219 Gastro-esophageal reflux disease without esophagitis: Secondary | ICD-10-CM | POA: Diagnosis not present

## 2021-02-26 DIAGNOSIS — Z79899 Other long term (current) drug therapy: Secondary | ICD-10-CM | POA: Diagnosis not present

## 2021-02-26 DIAGNOSIS — Z853 Personal history of malignant neoplasm of breast: Secondary | ICD-10-CM | POA: Diagnosis not present

## 2021-02-26 DIAGNOSIS — Z01818 Encounter for other preprocedural examination: Secondary | ICD-10-CM | POA: Insufficient documentation

## 2021-02-26 DIAGNOSIS — Z8049 Family history of malignant neoplasm of other genital organs: Secondary | ICD-10-CM | POA: Diagnosis not present

## 2021-02-26 DIAGNOSIS — Z7982 Long term (current) use of aspirin: Secondary | ICD-10-CM | POA: Diagnosis not present

## 2021-02-26 LAB — POTASSIUM: Potassium: 3.6 mmol/L (ref 3.5–5.1)

## 2021-02-28 ENCOUNTER — Other Ambulatory Visit: Payer: Self-pay

## 2021-02-28 ENCOUNTER — Ambulatory Visit
Admission: RE | Admit: 2021-02-28 | Discharge: 2021-02-28 | Disposition: A | Discharge status: Home / Self Care | Payer: BC Managed Care – PPO | Source: Ambulatory Visit | Attending: General Surgery | Admitting: General Surgery

## 2021-02-28 ENCOUNTER — Ambulatory Visit: Payer: BC Managed Care – PPO | Admitting: Certified Registered"

## 2021-02-28 ENCOUNTER — Encounter
Admission: RE | Disposition: A | Discharge status: Home / Self Care | Payer: Self-pay | Source: Ambulatory Visit | Attending: General Surgery

## 2021-02-28 DIAGNOSIS — D242 Benign neoplasm of left breast: Secondary | ICD-10-CM | POA: Diagnosis not present

## 2021-02-28 DIAGNOSIS — K219 Gastro-esophageal reflux disease without esophagitis: Secondary | ICD-10-CM | POA: Insufficient documentation

## 2021-02-28 DIAGNOSIS — I10 Essential (primary) hypertension: Secondary | ICD-10-CM | POA: Insufficient documentation

## 2021-02-28 DIAGNOSIS — Z8249 Family history of ischemic heart disease and other diseases of the circulatory system: Secondary | ICD-10-CM | POA: Insufficient documentation

## 2021-02-28 DIAGNOSIS — Z791 Long term (current) use of non-steroidal anti-inflammatories (NSAID): Secondary | ICD-10-CM | POA: Insufficient documentation

## 2021-02-28 DIAGNOSIS — Z7982 Long term (current) use of aspirin: Secondary | ICD-10-CM | POA: Insufficient documentation

## 2021-02-28 DIAGNOSIS — Z853 Personal history of malignant neoplasm of breast: Secondary | ICD-10-CM | POA: Insufficient documentation

## 2021-02-28 DIAGNOSIS — E119 Type 2 diabetes mellitus without complications: Secondary | ICD-10-CM | POA: Insufficient documentation

## 2021-02-28 DIAGNOSIS — Z79899 Other long term (current) drug therapy: Secondary | ICD-10-CM | POA: Insufficient documentation

## 2021-02-28 DIAGNOSIS — Z888 Allergy status to other drugs, medicaments and biological substances status: Secondary | ICD-10-CM | POA: Insufficient documentation

## 2021-02-28 DIAGNOSIS — Z885 Allergy status to narcotic agent status: Secondary | ICD-10-CM | POA: Insufficient documentation

## 2021-02-28 DIAGNOSIS — Z8049 Family history of malignant neoplasm of other genital organs: Secondary | ICD-10-CM | POA: Insufficient documentation

## 2021-02-28 HISTORY — PX: BREAST BIOPSY WITH RADIO FREQUENCY LOCALIZER: SHX6895

## 2021-02-28 HISTORY — PX: BREAST EXCISIONAL BIOPSY: SUR124

## 2021-02-28 LAB — GLUCOSE, CAPILLARY
Glucose-Capillary: 97 mg/dL (ref 70–99)
Glucose-Capillary: 87 mg/dL (ref 70–99)

## 2021-02-28 SURGERY — BREAST BIOPSY WITH RADIO FREQUENCY LOCALIZER
Anesthesia: General | Site: Breast | Laterality: Left

## 2021-02-28 MED ORDER — PROPOFOL 10 MG/ML IV BOLUS
INTRAVENOUS | Status: AC
  Filled 2021-02-28: qty 20

## 2021-02-28 MED ORDER — PROPOFOL 10 MG/ML IV BOLUS
INTRAVENOUS | Status: DC | PRN
  Administered 2021-02-28: 160 mg via INTRAVENOUS

## 2021-02-28 MED ORDER — FENTANYL CITRATE (PF) 100 MCG/2ML IJ SOLN
25.0000 ug | INTRAMUSCULAR | Status: DC | PRN
Start: 2021-02-28 — End: 2021-02-28

## 2021-02-28 MED ORDER — DEXAMETHASONE SODIUM PHOSPHATE 10 MG/ML IJ SOLN
INTRAMUSCULAR | Status: DC | PRN
  Administered 2021-02-28: 10 mg via INTRAVENOUS

## 2021-02-28 MED ORDER — LACTATED RINGERS IV SOLN: INTRAVENOUS | Status: DC

## 2021-02-28 MED ORDER — CEFAZOLIN SODIUM-DEXTROSE 2-4 GM/100ML-% IV SOLN
2.0000 g | INTRAVENOUS | Status: AC
  Administered 2021-02-28: 2 g via INTRAVENOUS

## 2021-02-28 MED ORDER — BUPIVACAINE HCL (PF) 0.5 % IJ SOLN
INTRAMUSCULAR | Status: AC
  Filled 2021-02-28: qty 30

## 2021-02-28 MED ORDER — ONDANSETRON HCL 4 MG/2ML IJ SOLN: 4.0000 mg | Freq: Once | INTRAMUSCULAR | Status: DC | PRN

## 2021-02-28 MED ORDER — FENTANYL CITRATE (PF) 100 MCG/2ML IJ SOLN
INTRAMUSCULAR | Status: AC
  Filled 2021-02-28: qty 2

## 2021-02-28 MED ORDER — ONDANSETRON HCL 4 MG/2ML IJ SOLN
INTRAMUSCULAR | Status: DC | PRN
  Administered 2021-02-28: 4 mg via INTRAVENOUS

## 2021-02-28 MED ORDER — TRAMADOL HCL 50 MG PO TABS: 50.0000 mg | ORAL_TABLET | Freq: Four times a day (QID) | ORAL | 0 refills | Status: DC | PRN

## 2021-02-28 MED ORDER — ONDANSETRON HCL 4 MG PO TABS: 4.0000 mg | ORAL_TABLET | Freq: Every day | ORAL | 0 refills | Status: AC | PRN

## 2021-02-28 MED ORDER — FENTANYL CITRATE (PF) 100 MCG/2ML IJ SOLN
INTRAMUSCULAR | Status: DC | PRN
  Administered 2021-02-28 (×2): 25 ug via INTRAVENOUS
  Administered 2021-02-28 (×2): 50 ug via INTRAVENOUS

## 2021-02-28 MED ORDER — CHLORHEXIDINE GLUCONATE 0.12 % MT SOLN
15.0000 mL | Freq: Once | OROMUCOSAL | Status: AC
  Administered 2021-02-28: 15 mL via OROMUCOSAL

## 2021-02-28 MED ORDER — ORAL CARE MOUTH RINSE: 15.0000 mL | Freq: Once | OROMUCOSAL | Status: AC

## 2021-02-28 MED ORDER — CEFAZOLIN SODIUM-DEXTROSE 2-4 GM/100ML-% IV SOLN
INTRAVENOUS | Status: AC
  Filled 2021-02-28: qty 100

## 2021-02-28 MED ORDER — CHLORHEXIDINE GLUCONATE 0.12 % MT SOLN
OROMUCOSAL | Status: AC
  Filled 2021-02-28: qty 15

## 2021-02-28 MED ORDER — LACTATED RINGERS IV SOLN: INTRAVENOUS | Status: DC | PRN

## 2021-02-28 MED ORDER — LIDOCAINE HCL (CARDIAC) PF 100 MG/5ML IV SOSY
PREFILLED_SYRINGE | INTRAVENOUS | Status: DC | PRN
  Administered 2021-02-28: 60 mg via INTRAVENOUS

## 2021-02-28 MED ORDER — SODIUM CHLORIDE 0.9 % IV SOLN: INTRAVENOUS | Status: DC | PRN

## 2021-02-28 MED ORDER — BUPIVACAINE-EPINEPHRINE (PF) 0.5% -1:200000 IJ SOLN
INTRAMUSCULAR | Status: DC | PRN
  Administered 2021-02-28: 30 mL via PERINEURAL

## 2021-02-28 MED ORDER — EPHEDRINE SULFATE 50 MG/ML IJ SOLN
INTRAMUSCULAR | Status: DC | PRN
  Administered 2021-02-28: 10 mg via INTRAVENOUS

## 2021-02-28 SURGICAL SUPPLY — 46 items
ADH SKN CLS APL DERMABOND .7 (GAUZE/BANDAGES/DRESSINGS) ×1
APL PRP STRL LF DISP 70% ISPRP (MISCELLANEOUS) ×1
BLADE SURG 15 STRL LF DISP TIS (BLADE) ×1 IMPLANT
BLADE SURG 15 STRL SS (BLADE) ×2
CANISTER SUCT 1200ML W/VALVE (MISCELLANEOUS) ×2 IMPLANT
CHLORAPREP W/TINT 26 (MISCELLANEOUS) ×2 IMPLANT
CNTNR SPEC 2.5X3XGRAD LEK (MISCELLANEOUS) ×1
CONT SPEC 4OZ STER OR WHT (MISCELLANEOUS) ×1
CONT SPEC 4OZ STRL OR WHT (MISCELLANEOUS) ×1
CONTAINER SPEC 2.5X3XGRAD LEK (MISCELLANEOUS) ×1 IMPLANT
COVER PROBE FLX POLY STRL (MISCELLANEOUS) ×2 IMPLANT
COVER WAND RF STERILE (DRAPES) ×2 IMPLANT
DERMABOND ADVANCED (GAUZE/BANDAGES/DRESSINGS) ×1
DERMABOND ADVANCED .7 DNX12 (GAUZE/BANDAGES/DRESSINGS) ×1 IMPLANT
DEVICE DUBIN SPECIMEN MAMMOGRA (MISCELLANEOUS) ×2 IMPLANT
DRAPE LAPAROTOMY TRNSV 106X77 (MISCELLANEOUS) ×2 IMPLANT
ELECT CAUTERY BLADE TIP 2.5 (TIP) ×2
ELECT REM PT RETURN 9FT ADLT (ELECTROSURGICAL) ×2
ELECTRODE CAUTERY BLDE TIP 2.5 (TIP) ×1 IMPLANT
ELECTRODE REM PT RTRN 9FT ADLT (ELECTROSURGICAL) ×1 IMPLANT
GLOVE SURG ENC MOIS LTX SZ6.5 (GLOVE) ×2 IMPLANT
GLOVE SURG UNDER POLY LF SZ6.5 (GLOVE) ×2 IMPLANT
GOWN STRL REUS W/ TWL LRG LVL3 (GOWN DISPOSABLE) ×3 IMPLANT
GOWN STRL REUS W/TWL LRG LVL3 (GOWN DISPOSABLE) ×6
KIT MARKER MARGIN INK (KITS) ×2 IMPLANT
KIT TURNOVER KIT A (KITS) ×2 IMPLANT
LABEL OR SOLS (LABEL) ×2 IMPLANT
MANIFOLD NEPTUNE II (INSTRUMENTS) ×2 IMPLANT
MARGIN MAP 10MM (MISCELLANEOUS) IMPLANT
MARKER MARGIN CORRECT CLIP (MARKER) ×2 IMPLANT
NEEDLE HYPO 25X1 1.5 SAFETY (NEEDLE) ×2 IMPLANT
PACK BASIN MINOR ARMC (MISCELLANEOUS) ×2 IMPLANT
RETRACTOR RING XSMALL (MISCELLANEOUS) ×1 IMPLANT
RTRCTR WOUND ALEXIS 13CM XS SH (MISCELLANEOUS) ×2
SET LOCALIZER 20 PROBE US (MISCELLANEOUS) ×2 IMPLANT
SUT ETHILON 3-0 FS-10 30 BLK (SUTURE)
SUT MNCRL 4-0 (SUTURE) ×2
SUT MNCRL 4-0 27XMFL (SUTURE) ×1
SUT SILK 2 0 SH (SUTURE) ×2 IMPLANT
SUT VIC AB 3-0 SH 27 (SUTURE) ×2
SUT VIC AB 3-0 SH 27X BRD (SUTURE) ×1 IMPLANT
SUTURE EHLN 3-0 FS-10 30 BLK (SUTURE) IMPLANT
SUTURE MNCRL 4-0 27XMF (SUTURE) ×1 IMPLANT
SYR 10ML LL (SYRINGE) ×2 IMPLANT
SYR BULB IRRIG 60ML STRL (SYRINGE) ×2 IMPLANT
WATER STERILE IRR 1000ML POUR (IV SOLUTION) ×2 IMPLANT

## 2021-02-28 NOTE — Transfer of Care (Signed)
Immediate Anesthesia Transfer of Care Note  Patient: Katie Thompson  Procedure(s) Performed: BREAST BIOPSY WITH RADIO FREQUENCY LOCALIZER (Left Breast)  Patient Location: PACU  Anesthesia Type:General  Level of Consciousness: awake, alert  and oriented  Airway & Oxygen Therapy: Patient Spontanous Breathing and Patient connected to face mask oxygen  Post-op Assessment: Report given to RN and Post -op Vital signs reviewed and stable  Post vital signs: Reviewed and stable  Last Vitals:  Vitals Value Taken Time  BP    Temp    Pulse 86 02/28/21 1325  Resp 15 02/28/21 1325  SpO2 100 % 02/28/21 1325  Vitals shown include unvalidated device data.  Last Pain:  Vitals:   02/28/21 1115  PainSc: 0-No pain         Complications: No complications documented.

## 2021-02-28 NOTE — Anesthesia Procedure Notes (Signed)
Procedure Name: LMA Insertion Date/Time: 02/28/2021 12:21 PM Performed by: Philbert Riser, CRNA Pre-anesthesia Checklist: Patient identified, Emergency Drugs available, Suction available, Patient being monitored and Timeout performed Patient Re-evaluated:Patient Re-evaluated prior to induction Oxygen Delivery Method: Circle system utilized and Simple face mask Preoxygenation: Pre-oxygenation with 100% oxygen Induction Type: IV induction LMA Size: 3.5

## 2021-02-28 NOTE — Anesthesia Preprocedure Evaluation (Signed)
Anesthesia Evaluation  Patient identified by MRN, date of birth, ID band Patient awake    Reviewed: Allergy & Precautions, H&P , NPO status , Patient's Chart, lab work & pertinent test results, reviewed documented beta blocker date and time   Airway Mallampati: II  TM Distance: >3 FB Neck ROM: full    Dental  (+) Teeth Intact   Pulmonary neg pulmonary ROS,    Pulmonary exam normal        Cardiovascular Exercise Tolerance: Good hypertension, On Medications negative cardio ROS Normal cardiovascular exam Rate:Normal     Neuro/Psych negative neurological ROS  negative psych ROS   GI/Hepatic Neg liver ROS, hiatal hernia, GERD  Medicated,  Endo/Other  negative endocrine ROSdiabetes, Well Controlled  Renal/GU negative Renal ROS  negative genitourinary   Musculoskeletal   Abdominal   Peds  Hematology negative hematology ROS (+)   Anesthesia Other Findings   Reproductive/Obstetrics negative OB ROS                             Anesthesia Physical Anesthesia Plan  ASA: II  Anesthesia Plan: General LMA   Post-op Pain Management:    Induction:   PONV Risk Score and Plan:   Airway Management Planned:   Additional Equipment:   Intra-op Plan:   Post-operative Plan:   Informed Consent: I have reviewed the patients History and Physical, chart, labs and discussed the procedure including the risks, benefits and alternatives for the proposed anesthesia with the patient or authorized representative who has indicated his/her understanding and acceptance.       Plan Discussed with: CRNA  Anesthesia Plan Comments:         Anesthesia Quick Evaluation

## 2021-02-28 NOTE — Op Note (Signed)
Preoperative diagnosis: Left breast intraductal papilloma.  Postoperative diagnosis: Left breast intraductal papilloma.   Procedure: Left radiofrequency tag-localized excisional biopsy.                      Anesthesia: GETA  Surgeon: Dr. Windell Moment  Wound Classification: Clean  Indications: Patient is a 75 y.o. female with a nonpalpable left breast mass noted on mammography with core biopsy demonstrating intraductal papilloma requires radiofrequency tag-localized excisional biopsy to rule out malignancy.   Findings: 1. Specimen mammography shows marker and tag on specimen 2. No other palpable mass or lymph node identified.   Description of procedure: Preoperative radiofrequency tag localization was performed by radiology. The patient was taken to the operating room and placed supine on the operating table, and after general anesthesia the left chest and axilla were prepped and draped in the usual sterile fashion. A time-out was completed verifying correct patient, procedure, site, positioning, and implant(s) and/or special equipment prior to beginning this procedure.  By comparing the localization studies and interrogation with Localizer device, the probable trajectory and location of the mass was visualized. A circumareolar skin incision was planned in such a way as to minimize the amount of dissection to reach the mass.  The skin incision was made. Flaps were raised and the location of the tag was confirmed with Localizer device confirmed. A 2-0 silk figure-of-eight stay suture was placed and used for retraction. Dissection was then taken down circumferentially, taking care to include the entire localizing tag and a wide margin of grossly normal tissue. The specimen and entire localizing tag were removed. The specimen was oriented and sent to radiology with the localization studies. Confirmation was received that the entire target lesion had been resected. The wound was irrigated. Hemostasis  was checked. The wound was closed with interrupted sutures of 3-0 Vicryl and a subcuticular suture of Monocryl 3-0. No attempt was made to close the dead space.    Specimen: Left Breast excisional biopsy                     Complications: None  Estimated Blood Loss: 10 mL

## 2021-02-28 NOTE — Discharge Instructions (Signed)
Diet: Resume home heart healthy regular diet.   Activity: No heavy lifting >20 pounds (children, pets, laundry, garbage) or strenuous activity until follow-up, but light activity and walking are encouraged. Do not drive or drink alcohol if taking narcotic pain medications.  Wound care: May shower with soapy water and pat dry (do not rub incisions), but no baths or submerging incision underwater until follow-up. (no swimming)   Medications: Resume all home medications. For mild to moderate pain: acetaminophen (Tylenol) or ibuprofen (if no kidney disease). Combining Tylenol with alcohol can substantially increase your risk of causing liver disease. Narcotic pain medications, if prescribed, can be used for severe pain, though may cause nausea, constipation, and drowsiness. If you do not need the narcotic pain medication, you do not need to fill the prescription.  Call office [phone]) at any time if any questions, worsening pain, fevers/chills, bleeding, drainage from incision site, or other concerns.  AMBULATORY SURGERY  DISCHARGE INSTRUCTIONS   1) The drugs that you were given will stay in your system until tomorrow so for the next 24 hours you should not:  A) Drive an automobile B) Make any legal decisions C) Drink any alcoholic beverage   2) You may resume regular meals tomorrow.  Today it is better to start with liquids and gradually work up to solid foods.  You may eat anything you prefer, but it is better to start with liquids, then soup and crackers, and gradually work up to solid foods.   3) Please notify your doctor immediately if you have any unusual bleeding, trouble breathing, redness and pain at the surgery site, drainage, fever, or pain not relieved by medication. Please contact your physician with any problems or Same Day Surgery at [phone], Monday through Friday 6 am to 4 pm, or [place] at [facility] number at [phone].   Breast Biopsy, Care  After These instructions give you information about caring for yourself after your procedure. Your doctor may also give you more specific instructions. Call your doctor if you have any problems or questions after your procedure. What can I expect after the procedure? After your procedure, it is common to have:  Bruising on your breast.  Numbness, tingling, or pain near your biopsy site. Follow these instructions at home: Medicines  Take over-the-counter and prescription medicines only as told by your doctor.  Do not drive for 24 hours if you were given a medicine to help you relax (sedative) during your procedure.  Do not drink alcohol while taking pain medicine.  Do not drive or use heavy machinery while taking prescription pain medicine. Biopsy site care  Follow instructions from your doctor about how to take care of your cut from surgery (incision) or your puncture area. Make sure you: ? Wash your hands with soap and water before you change your bandage (dressing). If you cannot use soap and water, use hand sanitizer. ? Change your bandage as told by your doctor. ? Leave stitches (sutures), skin glue, or skin tape (adhesive strips) in place. They may need to stay in place for 2 weeks or longer. If tape strips get loose and curl up, you may trim the loose edges. Do not remove tape strips completely unless your doctor says it is okay.  If you have stitches, keep them dry when you take a bath or a shower.  Check your cut or puncture area every day for signs of infection. Check for: ? Redness, swelling, or pain. ? Fluid or blood. ? Warmth. ?  Pus or a bad smell.  Protect the biopsy area. Do not let the area get bumped.      Activity  If you had a cut during your procedure, avoid activities that could pull your cut open. These include: ? Stretching. ? Reaching over your head. ? Exercise. ? Sports. ? Lifting anything that weighs more than 3 lb (1.4 kg).  Return to your normal  activities as told by your doctor. Ask your doctor what activities are safe for you. Managing pain, stiffness, and swelling If told, put ice on the biopsy site to relieve swelling:  Put ice in a plastic bag.  Place a towel between your skin and the bag.  Leave the ice on for 20 minutes, 2-3 times a day. General instructions  Continue your normal diet.  Wear a good support bra for as long as told by your doctor.  Get checked for extra fluid around your lymph nodes (lymphedema) as often as told by your doctor.  Keep all follow-up visits as told by your doctor. This is important. Contact a doctor if:  You notice any of the following at the biopsy site: ? More redness, swelling, or pain. ? More fluid or blood coming from the site. ? The site feels warm to the touch. ? Pus or a bad smell coming from the site. ? The site breaks open after the stitches or skin tape strips have been removed.  You have a rash.  You have a fever. Get help right away if:  You have more bleeding from the biopsy site. Get help right away if bleeding is more than a small spot.  You have trouble breathing.  You have red streaks around the biopsy site. Summary  After your procedure, it is common to have bruising, numbness, tingling, or pain near the biopsy site.  Do not drive or use heavy machinery while taking prescription pain medicine.  Wear a good support bra for as long as told by your doctor.  If you had a cut during your procedure, avoid activities that may pull the cut open. Ask your doctor what activities are safe for you. This information is not intended to replace advice given to you by your health care provider. Make sure you discuss any questions you have with your health care provider. Document Revised: 06/12/2020 Document Reviewed: 03/19/2018 Elsevier Patient Education  [place].

## 2021-02-28 NOTE — Interval H&P Note (Signed)
History and Physical Interval Note:  02/28/2021 12:00 PM  Katie Thompson  has presented today for surgery, with the diagnosis of D24.2 Intraductal papilloma of breast, left.  The various methods of treatment have been discussed with the patient and family. After consideration of risks, benefits and other options for treatment, the patient has consented to  Procedure(s): BREAST BIOPSY WITH RADIO FREQUENCY LOCALIZER (Left) as a surgical intervention.  The patient's history has been reviewed, patient examined, no change in status, stable for surgery.  I have reviewed the patient's chart and labs.  Questions were answered to the patient's satisfaction.     Herbert Pun

## 2021-03-01 ENCOUNTER — Encounter: Payer: Self-pay | Admitting: General Surgery

## 2021-03-01 NOTE — Anesthesia Postprocedure Evaluation (Signed)
Anesthesia Post Note  Patient: Katie Thompson  Procedure(s) Performed: BREAST BIOPSY WITH RADIO FREQUENCY LOCALIZER (Left Breast)  Patient location during evaluation: PACU Anesthesia Type: General Level of consciousness: awake and alert Pain management: pain level controlled Vital Signs Assessment: post-procedure vital signs reviewed and stable Respiratory status: spontaneous breathing, nonlabored ventilation, respiratory function stable and patient connected to nasal cannula oxygen Cardiovascular status: blood pressure returned to baseline and stable Postop Assessment: no apparent nausea or vomiting Anesthetic complications: no   No complications documented.   Last Vitals:  Vitals:   02/28/21 1415 02/28/21 1430  BP: (!) 155/81 (!) 154/67  Pulse: (!) 52   Resp: 17 16  Temp:  (!) 36.4 C  SpO2: 99% 97%    Last Pain:  Vitals:   03/01/21 1339  TempSrc:   PainSc: 0-No pain                 Molli Barrows

## 2021-03-05 LAB — SURGICAL PATHOLOGY

## 2021-03-20 ENCOUNTER — Other Ambulatory Visit: Payer: Self-pay | Admitting: *Deleted

## 2021-05-23 ENCOUNTER — Encounter: Payer: Self-pay | Admitting: General Surgery

## 2021-06-04 ENCOUNTER — Other Ambulatory Visit: Payer: BC Managed Care – PPO

## 2021-06-04 ENCOUNTER — Ambulatory Visit: Payer: BC Managed Care – PPO | Admitting: Hematology and Oncology

## 2021-06-06 ENCOUNTER — Other Ambulatory Visit: Payer: Self-pay

## 2021-06-06 DIAGNOSIS — D0512 Intraductal carcinoma in situ of left breast: Secondary | ICD-10-CM

## 2021-06-07 ENCOUNTER — Other Ambulatory Visit: Payer: Self-pay

## 2021-06-07 ENCOUNTER — Inpatient Hospital Stay: Payer: BC Managed Care – PPO | Attending: Oncology

## 2021-06-07 ENCOUNTER — Encounter: Payer: Self-pay | Admitting: Oncology

## 2021-06-07 ENCOUNTER — Inpatient Hospital Stay (HOSPITAL_BASED_OUTPATIENT_CLINIC_OR_DEPARTMENT_OTHER): Payer: BC Managed Care – PPO | Admitting: Oncology

## 2021-06-07 VITALS — BP 131/82 | HR 83 | Temp 97.2°F | Resp 18 | Wt 180.3 lb

## 2021-06-07 DIAGNOSIS — M810 Age-related osteoporosis without current pathological fracture: Secondary | ICD-10-CM | POA: Insufficient documentation

## 2021-06-07 DIAGNOSIS — N6099 Unspecified benign mammary dysplasia of unspecified breast: Secondary | ICD-10-CM | POA: Diagnosis not present

## 2021-06-07 DIAGNOSIS — Z853 Personal history of malignant neoplasm of breast: Secondary | ICD-10-CM | POA: Diagnosis present

## 2021-06-07 DIAGNOSIS — D0512 Intraductal carcinoma in situ of left breast: Secondary | ICD-10-CM

## 2021-06-07 DIAGNOSIS — C801 Malignant (primary) neoplasm, unspecified: Secondary | ICD-10-CM | POA: Insufficient documentation

## 2021-06-07 DIAGNOSIS — I10 Essential (primary) hypertension: Secondary | ICD-10-CM | POA: Insufficient documentation

## 2021-06-07 LAB — COMPREHENSIVE METABOLIC PANEL
ALT: 11 U/L (ref 0–44)
AST: 19 U/L (ref 15–41)
Albumin: 3.8 g/dL (ref 3.5–5.0)
Alkaline Phosphatase: 98 U/L (ref 38–126)
Anion gap: 6 (ref 5–15)
BUN: 22 mg/dL (ref 8–23)
CO2: 28 mmol/L (ref 22–32)
Calcium: 9.5 mg/dL (ref 8.9–10.3)
Chloride: 103 mmol/L (ref 98–111)
Creatinine, Ser: 0.97 mg/dL (ref 0.44–1.00)
GFR, Estimated: >60 mL/min (ref >60)
Glucose, Bld: 150 mg/dL — ABNORMAL HIGH (ref 70–99)
Potassium: 4.3 mmol/L (ref 3.5–5.1)
Sodium: 137 mmol/L (ref 135–145)
Total Bilirubin: 0.7 mg/dL (ref 0.3–1.2)
Total Protein: 7.8 g/dL (ref 6.5–8.1)

## 2021-06-07 LAB — CBC WITH DIFFERENTIAL/PLATELET
Abs Immature Granulocytes: 0.02 10*3/uL (ref 0.00–0.07)
Basophils Absolute: 0 10*3/uL (ref 0.0–0.1)
Basophils Relative: 1 %
Eosinophils Absolute: 0.3 10*3/uL (ref 0.0–0.5)
Eosinophils Relative: 5 %
HCT: 34.9 % — ABNORMAL LOW (ref 36.0–46.0)
Hemoglobin: 11.7 g/dL — ABNORMAL LOW (ref 12.0–15.0)
Immature Granulocytes: 0 %
Lymphocytes Relative: 41 %
Lymphs Abs: 2.4 10*3/uL (ref 0.7–4.0)
MCH: 28.3 pg (ref 26.0–34.0)
MCHC: 33.5 g/dL (ref 30.0–36.0)
MCV: 84.5 fL (ref 80.0–100.0)
Monocytes Absolute: 0.4 10*3/uL (ref 0.1–1.0)
Monocytes Relative: 6 %
Neutro Abs: 2.8 10*3/uL (ref 1.7–7.7)
Neutrophils Relative %: 47 %
Platelets: 323 10*3/uL (ref 150–400)
RBC: 4.13 MIL/uL (ref 3.87–5.11)
RDW: 15.9 % — ABNORMAL HIGH (ref 11.5–15.5)
WBC: 5.9 10*3/uL (ref 4.0–10.5)
nRBC: 0 % (ref 0.0–0.2)

## 2021-06-07 MED ORDER — ANASTROZOLE 1 MG PO TABS: 1.0000 mg | ORAL_TABLET | Freq: Every day | ORAL | 1 refills | Status: DC

## 2021-06-07 NOTE — Progress Notes (Signed)
[facility]  [address], [address] [place], [place] [address] Phone: [phone]  Fax: [phone]   Clinic Day:  06/07/2021  Referring physician: Baxter Hire, MD  Chief Complaint: Katie Thompson is a 75 y.o. female presents for follow-up of left breast DCIS   PERTINENT ONCOLOGY HISTORY Patient previously followed up by Dr.Corcoran, patient switched care to me on 06/07/21 Extensive medical record review was performed by me  02/15/2013 left breast DCIS status postlumpectomy. Pathology revealed a 16 mm area of grade II residual ductal carcinoma in situ, cribriform type.  Margins were clear. DCIS was ER 75%, and PR 25%.  Pathologic stage was pTis pNx.  She received accelerated partial breast irradiation, 3400 cGy in 10 fractions at 340 cGy twice a day.  She received tamoxifen from 03/2013 - 06/2018.   Osteoporosis-patient has bone density done through [facility]. 02/16/2021 DEXA osteopenia 10-year risk of hip fracture 1.3, 10-year risk of any major fracture 5.6.     INTERVAL HISTORY Katie Thompson is a 75 y.o. female who has above history reviewed by me today presents for follow up visit for management of history of left DCIS and left breast ADH  01/01/2021, bilateral breast screening mammogram showed possible asymmetry in the left breast. 01/18/2021, left diagnostic mammogram showed indeterminate mass in the upper inner quadrant of the left breast. 01/29/2021, left breast mass biopsy showed intraductal papilloma with sclerosing adenosis and associated calcifications.  Negative for malignancy. 02/28/2021, excision of left breast mass showed focal atypical ductal hyperplasia.  Background fibrocystic and apocrine changes with focal usual ductal hyperplasia.  Negative for residual intraductal papilloma. Negative for ductal carcinoma in situ and malignancy.  Today patient reports feeling well.  No new complaints.  Past Medical History:  Diagnosis Date    Arthritis    Breast cancer ([place]) 2014   LT with radiation   Diabetes mellitus without complication ([place])    diet controlled no meds   Diverticulosis feb. 2015   from colonoscopy   GERD (gastroesophageal reflux disease)    History of hiatal hernia    Hyperlipidemia    Hypertension    Personal history of radiation therapy 2014   DCIS left breast    Past Surgical History:  Procedure Laterality Date   APPENDECTOMY     BREAST BIOPSY Left 2016   coil marker, BENIGN BREAST TISSUE WITH CLUSTERED MICROCYSTS AND STROMAL FIBROSIS.    BREAST BIOPSY Left 01/05/2018   Affirm Bx- x marker, FIBROCYSTIC CHANGE WITH MICROCALCIFICATIONS   BREAST BIOPSY Left 02/04/2013   DCIS   BREAST BIOPSY Left 01/29/2021   Stereo Bx, X-Clip, INTRADUCTAL PAPILLOMA WITH SCLEROSING ADENOSIS    BREAST BIOPSY WITH RADIO FREQUENCY LOCALIZER Left 02/28/2021   Procedure: BREAST BIOPSY WITH RADIO FREQUENCY LOCALIZER;  Surgeon: Herbert Pun, MD;  Location: [facility] ORS;  Service: General;  Laterality: Left;   BREAST EXCISIONAL BIOPSY Left 07/29/2017   SCLEROTIC INTRADUCTAL PAPILLOMA.   BREAST LUMPECTOMY Left 2014   DCIS with clear margins.    BTL     EXCISION OF BREAST LESION Left 07/29/2017   Procedure: EXCISION OF BREAST MASS;  Surgeon: Leonie Green, MD;  Location: [facility] ORS;  Service: General;  Laterality: Left;   HERNIA REPAIR     NASAL SINUS SURGERY      Family History  Problem Relation Age of Onset   Ovarian cancer Mother    Hypertension Mother    Diabetes Paternal Grandmother    Hypertension Paternal Grandmother  Breast cancer Paternal Aunt 67    Social History:  reports that she has never smoked. She has never used smokeless tobacco. She reports that she does not drink alcohol and does not use drugs.  She denies any exposure to radiation or toxins.  She works at [employer] full-time.  She lives in [place].  The patient is alone today.  Allergies:  Allergies  Allergen Reactions    Codeine Nausea And Vomiting   Gatifloxacin Hives    Current Medications: Current Outpatient Medications  Medication Sig Dispense Refill   anastrozole (ARIMIDEX) 1 MG tablet Take 1 tablet (1 mg total) by mouth daily. 30 tablet 1   aspirin EC 81 MG tablet Take 81 mg by mouth daily.      atorvastatin (LIPITOR) 10 MG tablet Take 10 mg by mouth every morning.     Cholecalciferol (VITAMIN D3) 50 MCG (2000 UT) capsule Take 2,000 Units by mouth daily.     Cholecalciferol 50 MCG (2000 UT) TABS Take 2,000 Units by mouth daily.     Cyanocobalamin (VITAMIN B12 PO) Take 1 tablet by mouth daily.     dorzolamide (TRUSOPT) 2 % ophthalmic solution Place 1 drop into both eyes 2 (two) times daily.     latanoprost (XALATAN) 0.005 % ophthalmic solution Place 1 drop into the left eye at bedtime.     losartan-hydrochlorothiazide (HYZAAR) 100-12.5 MG tablet Take 1 tablet by mouth every morning.     Omeprazole 20 MG TBEC Take 20 mg by mouth every morning.     verapamil (CALAN-SR) 240 MG CR tablet Take 240 mg by mouth every morning.     calcium carbonate (TUMS - DOSED IN MG ELEMENTAL CALCIUM) 500 MG chewable tablet Chew 1 tablet by mouth 2 (two) times daily. (Patient not taking: Reported on 06/07/2021)     tamoxifen (NOLVADEX) 20 MG tablet Take 1 tablet (20 mg total) by mouth daily. (Patient not taking: Reported on 06/07/2021) 30 tablet 12   traMADol (ULTRAM) 50 MG tablet Take 1 tablet (50 mg total) by mouth every 6 (six) hours as needed. (Patient not taking: Reported on 06/07/2021) 20 tablet 0   No current facility-administered medications for this visit.    Review of Systems  Constitutional:  Negative for chills, fever, malaise/fatigue and weight loss.  HENT:  Negative for sore throat.   Eyes:  Negative for redness.  Respiratory:  Negative for cough, shortness of breath and wheezing.   Cardiovascular:  Negative for chest pain, palpitations and leg swelling.  Gastrointestinal:  Negative for abdominal pain, blood  in stool, nausea and vomiting.  Genitourinary:  Negative for dysuria.  Musculoskeletal:  Negative for myalgias.  Skin:  Negative for rash.  Neurological:  Negative for dizziness, tingling and tremors.  Endo/Heme/Allergies:  Does not bruise/bleed easily.  Psychiatric/Behavioral:  Negative for hallucinations.   Performance status (ECOG): 1  Vitals Blood pressure 131/82, pulse 83, temperature (!) 97.2 F (36.2 C), resp. rate 18, weight 180 lb 5.4 oz (81.8 kg), SpO2 99 %.   Physical Exam Constitutional:      General: She is not in acute distress.    Appearance: She is not diaphoretic.  HENT:     Head: Normocephalic and atraumatic.     Nose: Nose normal.     Mouth/Throat:     Pharynx: No oropharyngeal exudate.  Eyes:     General: No scleral icterus.    Pupils: Pupils are equal, round, and reactive to light.  Cardiovascular:     Rate and  Rhythm: Normal rate and regular rhythm.     Heart sounds: No murmur heard. Pulmonary:     Effort: Pulmonary effort is normal. No respiratory distress.     Breath sounds: No rales.  Chest:     Chest wall: No tenderness.  Abdominal:     General: There is no distension.     Palpations: Abdomen is soft.     Tenderness: There is no abdominal tenderness.  Musculoskeletal:        General: Normal range of motion.     Cervical back: Normal range of motion and neck supple.  Skin:    General: Skin is warm and dry.     Findings: No erythema.  Neurological:     Mental Status: She is alert and oriented to person, place, and time.     Cranial Nerves: No cranial nerve deficit.     Motor: No abnormal muscle tone.     Coordination: Coordination normal.  Psychiatric:        Mood and Affect: Affect normal.   Breast examination was performed. Left breast status post excision.  No erythema or discharge.  Surgery site has well-healed.  Bilateral fibrocystic changes.  No discrete mass felt in both breast.  No palpable axillary lymphadenopathy.  Appointment on  06/07/2021  Component Date Value Ref Range Status   Sodium 06/07/2021 137  135 - 145 mmol/L Final   Potassium 06/07/2021 4.3  3.5 - 5.1 mmol/L Final   Chloride 06/07/2021 103  98 - 111 mmol/L Final   CO2 06/07/2021 28  22 - 32 mmol/L Final   Glucose, Bld 06/07/2021 150 (A) 70 - 99 mg/dL Final   Glucose reference range applies only to samples taken after fasting for at least 8 hours.   BUN 06/07/2021 22  8 - 23 mg/dL Final   Creatinine, Ser 06/07/2021 0.97  0.44 - 1.00 mg/dL Final   Calcium 06/07/2021 9.5  8.9 - 10.3 mg/dL Final   Total Protein 06/07/2021 7.8  6.5 - 8.1 g/dL Final   Albumin 06/07/2021 3.8  3.5 - 5.0 g/dL Final   AST 06/07/2021 19  15 - 41 U/L Final   ALT 06/07/2021 11  0 - 44 U/L Final   Alkaline Phosphatase 06/07/2021 98  38 - 126 U/L Final   Total Bilirubin 06/07/2021 0.7  0.3 - 1.2 mg/dL Final   GFR, Estimated 06/07/2021 >60  >60 mL/min Final   Comment: (NOTE) Calculated using the CKD-EPI Creatinine Equation (2021)    Anion gap 06/07/2021 6  5 - 15 Final   Performed at [facility], [address], [place] [address]   WBC 06/07/2021 5.9  4.0 - 10.5 K/uL Final   RBC 06/07/2021 4.13  3.87 - 5.11 MIL/uL Final   Hemoglobin 06/07/2021 11.7 (A) 12.0 - 15.0 g/dL Final   HCT 06/07/2021 34.9 (A) 36.0 - 46.0 % Final   MCV 06/07/2021 84.5  80.0 - 100.0 fL Final   MCH 06/07/2021 28.3  26.0 - 34.0 pg Final   MCHC 06/07/2021 33.5  30.0 - 36.0 g/dL Final   RDW 06/07/2021 15.9 (A) 11.5 - 15.5 % Final   Platelets 06/07/2021 323  150 - 400 K/uL Final   nRBC 06/07/2021 0.0  0.0 - 0.2 % Final   Neutrophils Relative % 06/07/2021 47  % Final   Neutro Abs 06/07/2021 2.8  1.7 - 7.7 K/uL Final   Lymphocytes Relative 06/07/2021 41  % Final   Lymphs Abs 06/07/2021 2.4  0.7 - 4.0 K/uL Final   Monocytes Relative 06/07/2021 6  % Final   Monocytes Absolute 06/07/2021 0.4  0.1 - 1.0 K/uL Final   Eosinophils Relative 06/07/2021 5  % Final   Eosinophils Absolute  06/07/2021 0.3  0.0 - 0.5 K/uL Final   Basophils Relative 06/07/2021 1  % Final   Basophils Absolute 06/07/2021 0.0  0.0 - 0.1 K/uL Final   Immature Granulocytes 06/07/2021 0  % Final   Abs Immature Granulocytes 06/07/2021 0.02  0.00 - 0.07 K/uL Final   Performed at [facility], [address], [place], [place] [address]    Assessment and Plan 1. Ductal carcinoma in situ (DCIS) of left breast   2. Atypical ductal hyperplasia, breast    # Left breast DCIS-status post lumpectomy in May 2014.  Status post radiation and tamoxifen for 5 years Continue annual mammogram screening.  #Left breast atypical ductal hyperplasia.  Discussed with patient that this is high risk precancerous lesions.  I recommend patient to be restarted on endocrine therapy for refill access.  Rationale of using aromatase inhibitor -Arimidex  discussed with patient.  Side effects of Arimidex including but not limited to hot flush, joint pain, fatigue, mood swing, osteoporosis discussed with patient. Patient voices understanding and willing to proceed.   #Osteopenia, recommend patient to take calcium and vitamin D supplementation.   Follow-up in 6 weeks for evaluation of tolerance of Arimidex.  I discussed the assessment and treatment plan with the patient.  The patient was provided an opportunity to ask questions and all were answered.  The patient agreed with the plan and demonstrated an understanding of the instructions.  The patient was advised to call back if the symptoms worsen or if the condition fails to improve as anticipated.  We spent sufficient time to discuss many aspect of care, questions were answered to patient's satisfaction. A total of 40 minutes was spent on this visit.  With 10 minutes spent reviewing image findings, pathology reports, 25 minutes counseling the patient on the diagnosis, plan of treatments, side effects of the treatment, management of symptoms.  Additional 5 minutes was  spent on answering patient's questions.  Earlie Server, MD, PhD Hematology Oncology [place] at [facility] 06/07/2021

## 2021-07-19 ENCOUNTER — Ambulatory Visit: Payer: BC Managed Care – PPO | Admitting: Oncology

## 2021-07-19 ENCOUNTER — Inpatient Hospital Stay (HOSPITAL_BASED_OUTPATIENT_CLINIC_OR_DEPARTMENT_OTHER): Payer: BC Managed Care – PPO | Admitting: Oncology

## 2021-07-19 ENCOUNTER — Inpatient Hospital Stay: Payer: BC Managed Care – PPO | Attending: Oncology

## 2021-07-19 ENCOUNTER — Other Ambulatory Visit: Payer: BC Managed Care – PPO

## 2021-07-19 ENCOUNTER — Encounter: Payer: Self-pay | Admitting: Oncology

## 2021-07-19 VITALS — BP 137/70 | HR 67 | Temp 98.4°F | Resp 17 | Wt 182.0 lb

## 2021-07-19 DIAGNOSIS — Z79811 Long term (current) use of aromatase inhibitors: Secondary | ICD-10-CM | POA: Diagnosis not present

## 2021-07-19 DIAGNOSIS — Z853 Personal history of malignant neoplasm of breast: Secondary | ICD-10-CM | POA: Diagnosis not present

## 2021-07-19 DIAGNOSIS — E876 Hypokalemia: Secondary | ICD-10-CM | POA: Diagnosis not present

## 2021-07-19 DIAGNOSIS — N6099 Unspecified benign mammary dysplasia of unspecified breast: Secondary | ICD-10-CM | POA: Diagnosis not present

## 2021-07-19 DIAGNOSIS — D0512 Intraductal carcinoma in situ of left breast: Secondary | ICD-10-CM

## 2021-07-19 DIAGNOSIS — N6092 Unspecified benign mammary dysplasia of left breast: Secondary | ICD-10-CM | POA: Diagnosis not present

## 2021-07-19 DIAGNOSIS — M8580 Other specified disorders of bone density and structure, unspecified site: Secondary | ICD-10-CM

## 2021-07-19 LAB — CBC WITH DIFFERENTIAL/PLATELET
Abs Immature Granulocytes: 0.01 10*3/uL (ref 0.00–0.07)
Basophils Absolute: 0.1 10*3/uL (ref 0.0–0.1)
Basophils Relative: 1 %
Eosinophils Absolute: 0.2 10*3/uL (ref 0.0–0.5)
Eosinophils Relative: 2 %
HCT: 33.5 % — ABNORMAL LOW (ref 36.0–46.0)
Hemoglobin: 11.2 g/dL — ABNORMAL LOW (ref 12.0–15.0)
Immature Granulocytes: 0 %
Lymphocytes Relative: 37 %
Lymphs Abs: 2.5 10*3/uL (ref 0.7–4.0)
MCH: 28.4 pg (ref 26.0–34.0)
MCHC: 33.4 g/dL (ref 30.0–36.0)
MCV: 84.8 fL (ref 80.0–100.0)
Monocytes Absolute: 0.7 10*3/uL (ref 0.1–1.0)
Monocytes Relative: 10 %
Neutro Abs: 3.5 10*3/uL (ref 1.7–7.7)
Neutrophils Relative %: 50 %
Platelets: 322 10*3/uL (ref 150–400)
RBC: 3.95 MIL/uL (ref 3.87–5.11)
RDW: 15.6 % — ABNORMAL HIGH (ref 11.5–15.5)
WBC: 6.9 10*3/uL (ref 4.0–10.5)
nRBC: 0 % (ref 0.0–0.2)

## 2021-07-19 LAB — COMPREHENSIVE METABOLIC PANEL
ALT: 11 U/L (ref 0–44)
AST: 18 U/L (ref 15–41)
Albumin: 3.7 g/dL (ref 3.5–5.0)
Alkaline Phosphatase: 89 U/L (ref 38–126)
Anion gap: 8 (ref 5–15)
BUN: 26 mg/dL — ABNORMAL HIGH (ref 8–23)
CO2: 26 mmol/L (ref 22–32)
Calcium: 9.4 mg/dL (ref 8.9–10.3)
Chloride: 105 mmol/L (ref 98–111)
Creatinine, Ser: 1.03 mg/dL — ABNORMAL HIGH (ref 0.44–1.00)
GFR, Estimated: 56 mL/min — ABNORMAL LOW (ref >60)
Glucose, Bld: 107 mg/dL — ABNORMAL HIGH (ref 70–99)
Potassium: 3.2 mmol/L — ABNORMAL LOW (ref 3.5–5.1)
Sodium: 139 mmol/L (ref 135–145)
Total Bilirubin: 0.5 mg/dL (ref 0.3–1.2)
Total Protein: 7.8 g/dL (ref 6.5–8.1)

## 2021-07-19 NOTE — Progress Notes (Signed)
Patient here for oncology follow-up appointment, expresses no complaints or concerns at this time.    

## 2021-07-19 NOTE — Progress Notes (Signed)
Hematology oncology progress note   Clinic Day:  07/19/2021  Referring physician: Baxter Hire, MD  Chief Complaint: Katie Thompson is a 75 y.o. female presents for follow-up of left breast DCIS   PERTINENT ONCOLOGY HISTORY Patient previously followed up by Dr.Corcoran, patient switched care to me on 06/07/21 Extensive medical record review was performed by me  02/15/2013 left breast DCIS status postlumpectomy. Pathology revealed a 16 mm area of grade II residual ductal carcinoma in situ, cribriform type.  Margins were clear. DCIS was ER 75%, and PR 25%.  Pathologic stage was pTis pNx.  She received accelerated partial breast irradiation, 3400 cGy in 10 fractions at 340 cGy twice a day.  She received tamoxifen from 03/2013 - 06/2018.   Osteoporosis-patient has bone density done through [facility]. 02/16/2021 DEXA osteopenia 10-year risk of hip fracture 1.3, 10-year risk of any major fracture 5.6.  01/01/2021, bilateral breast screening mammogram showed possible asymmetry in the left breast. 01/18/2021, left diagnostic mammogram showed indeterminate mass in the upper inner quadrant of the left breast. 01/29/2021, left breast mass biopsy showed intraductal papilloma with sclerosing adenosis and associated calcifications.  Negative for malignancy. 02/28/2021, excision of left breast mass showed focal atypical ductal hyperplasia.  Background fibrocystic and apocrine changes with focal usual ductal hyperplasia.  Negative for residual intraductal papilloma. Negative for ductal carcinoma in situ and malignancy.  06/07/2021, started on Arimidex 1 mg daily.   INTERVAL HISTORY Katie Thompson is a 75 y.o. female who has above history reviewed by me today presents for follow up visit for management of history of left DCIS and left breast ADH Today patient reports feeling well.  No new complaints. Patient is currently on Arimidex 1 mg daily.  Tolerates well.  No hot flash or body aches.  Past  Medical History:  Diagnosis Date   Arthritis    Breast cancer ([place]) 2014   LT with radiation   Diabetes mellitus without complication ([place])    diet controlled no meds   Diverticulosis feb. 2015   from colonoscopy   GERD (gastroesophageal reflux disease)    History of hiatal hernia    Hyperlipidemia    Hypertension    Personal history of radiation therapy 2014   DCIS left breast    Past Surgical History:  Procedure Laterality Date   APPENDECTOMY     BREAST BIOPSY Left 2016   coil marker, BENIGN BREAST TISSUE WITH CLUSTERED MICROCYSTS AND STROMAL FIBROSIS.    BREAST BIOPSY Left 01/05/2018   Affirm Bx- x marker, FIBROCYSTIC CHANGE WITH MICROCALCIFICATIONS   BREAST BIOPSY Left 02/04/2013   DCIS   BREAST BIOPSY Left 01/29/2021   Stereo Bx, X-Clip, INTRADUCTAL PAPILLOMA WITH SCLEROSING ADENOSIS    BREAST BIOPSY WITH RADIO FREQUENCY LOCALIZER Left 02/28/2021   Procedure: BREAST BIOPSY WITH RADIO FREQUENCY LOCALIZER;  Surgeon: Herbert Pun, MD;  Location: [facility] ORS;  Service: General;  Laterality: Left;   BREAST EXCISIONAL BIOPSY Left 07/29/2017   SCLEROTIC INTRADUCTAL PAPILLOMA.   BREAST LUMPECTOMY Left 2014   DCIS with clear margins.    BTL     EXCISION OF BREAST LESION Left 07/29/2017   Procedure: EXCISION OF BREAST MASS;  Surgeon: Leonie Green, MD;  Location: [facility] ORS;  Service: General;  Laterality: Left;   HERNIA REPAIR     NASAL SINUS SURGERY      Family History  Problem Relation Age of Onset   Ovarian cancer Mother    Hypertension Mother    Diabetes Paternal  Grandmother    Hypertension Paternal Grandmother    Breast cancer Paternal Aunt 63    Social History:  reports that she has never smoked. She has never used smokeless tobacco. She reports that she does not drink alcohol and does not use drugs.  She denies any exposure to radiation or toxins.  She works at [employer] full-time.  She lives in [place].  The patient is alone today.  Allergies:   Allergies  Allergen Reactions   Codeine Nausea And Vomiting   Gatifloxacin Hives    Current Medications: Current Outpatient Medications  Medication Sig Dispense Refill   anastrozole (ARIMIDEX) 1 MG tablet Take 1 tablet (1 mg total) by mouth daily. 30 tablet 1   aspirin EC 81 MG tablet Take 81 mg by mouth daily.      atorvastatin (LIPITOR) 10 MG tablet Take 10 mg by mouth every morning.     Cholecalciferol (VITAMIN D3) 50 MCG (2000 UT) capsule Take 2,000 Units by mouth daily.     Cholecalciferol 50 MCG (2000 UT) TABS Take 2,000 Units by mouth daily.     Cyanocobalamin (VITAMIN B12 PO) Take 1 tablet by mouth daily.     dorzolamide (TRUSOPT) 2 % ophthalmic solution Place 1 drop into both eyes 2 (two) times daily.     latanoprost (XALATAN) 0.005 % ophthalmic solution Place 1 drop into the left eye at bedtime.     losartan-hydrochlorothiazide (HYZAAR) 100-12.5 MG tablet Take 1 tablet by mouth every morning.     Omeprazole 20 MG TBEC Take 20 mg by mouth every morning.     verapamil (CALAN-SR) 240 MG CR tablet Take 240 mg by mouth every morning.     calcium carbonate (TUMS - DOSED IN MG ELEMENTAL CALCIUM) 500 MG chewable tablet Chew 1 tablet by mouth 2 (two) times daily. (Patient not taking: No sig reported)     traMADol (ULTRAM) 50 MG tablet Take 1 tablet (50 mg total) by mouth every 6 (six) hours as needed. (Patient not taking: No sig reported) 20 tablet 0   No current facility-administered medications for this visit.    Review of Systems  Constitutional:  Negative for chills, fever, malaise/fatigue and weight loss.  HENT:  Negative for sore throat.   Eyes:  Negative for redness.  Respiratory:  Negative for cough, shortness of breath and wheezing.   Cardiovascular:  Negative for chest pain, palpitations and leg swelling.  Gastrointestinal:  Negative for abdominal pain, blood in stool, nausea and vomiting.  Genitourinary:  Negative for dysuria.  Musculoskeletal:  Negative for myalgias.   Skin:  Negative for rash.  Neurological:  Negative for dizziness, tingling and tremors.  Endo/Heme/Allergies:  Does not bruise/bleed easily.  Psychiatric/Behavioral:  Negative for hallucinations.   Performance status (ECOG): 1  Vitals Blood pressure 137/70, pulse 67, temperature 98.4 F (36.9 C), temperature source Tympanic, resp. rate 17, weight 182 lb (82.6 kg), SpO2 100 %.   Physical Exam Constitutional:      General: She is not in acute distress.    Appearance: She is not diaphoretic.  HENT:     Head: Normocephalic and atraumatic.     Nose: Nose normal.     Mouth/Throat:     Pharynx: No oropharyngeal exudate.  Eyes:     General: No scleral icterus.    Pupils: Pupils are equal, round, and reactive to light.  Cardiovascular:     Rate and Rhythm: Normal rate and regular rhythm.     Heart sounds: No murmur heard.  Pulmonary:     Effort: Pulmonary effort is normal. No respiratory distress.     Breath sounds: No rales.  Chest:     Chest wall: No tenderness.  Abdominal:     General: There is no distension.     Palpations: Abdomen is soft.     Tenderness: There is no abdominal tenderness.  Musculoskeletal:        General: Normal range of motion.     Cervical back: Normal range of motion and neck supple.  Skin:    General: Skin is warm and dry.     Findings: No erythema.  Neurological:     Mental Status: She is alert and oriented to person, place, and time.     Cranial Nerves: No cranial nerve deficit.     Motor: No abnormal muscle tone.     Coordination: Coordination normal.  Psychiatric:        Mood and Affect: Affect normal.   Breast examination was performed. Left breast status post excision.  No erythema or discharge.  Surgery site has well-healed.  Bilateral fibrocystic changes.  No discrete mass felt in both breast.  No palpable axillary lymphadenopathy.  Appointment on 07/19/2021  Component Date Value Ref Range Status   Sodium 07/19/2021 139  135 - 145 mmol/L  Final   Potassium 07/19/2021 3.2 (A) 3.5 - 5.1 mmol/L Final   Chloride 07/19/2021 105  98 - 111 mmol/L Final   CO2 07/19/2021 26  22 - 32 mmol/L Final   Glucose, Bld 07/19/2021 107 (A) 70 - 99 mg/dL Final   Glucose reference range applies only to samples taken after fasting for at least 8 hours.   BUN 07/19/2021 26 (A) 8 - 23 mg/dL Final   Creatinine, Ser 07/19/2021 1.03 (A) 0.44 - 1.00 mg/dL Final   Calcium 07/19/2021 9.4  8.9 - 10.3 mg/dL Final   Total Protein 07/19/2021 7.8  6.5 - 8.1 g/dL Final   Albumin 07/19/2021 3.7  3.5 - 5.0 g/dL Final   AST 07/19/2021 18  15 - 41 U/L Final   ALT 07/19/2021 11  0 - 44 U/L Final   Alkaline Phosphatase 07/19/2021 89  38 - 126 U/L Final   Total Bilirubin 07/19/2021 0.5  0.3 - 1.2 mg/dL Final   GFR, Estimated 07/19/2021 56 (A) >60 mL/min Final   Comment: (NOTE) Calculated using the CKD-EPI Creatinine Equation (2021)    Anion gap 07/19/2021 8  5 - 15 Final   Performed at [facility], [address] [place], [place] [address]   WBC 07/19/2021 6.9  4.0 - 10.5 K/uL Final   RBC 07/19/2021 3.95  3.87 - 5.11 MIL/uL Final   Hemoglobin 07/19/2021 11.2 (A) 12.0 - 15.0 g/dL Final   HCT 07/19/2021 33.5 (A) 36.0 - 46.0 % Final   MCV 07/19/2021 84.8  80.0 - 100.0 fL Final   MCH 07/19/2021 28.4  26.0 - 34.0 pg Final   MCHC 07/19/2021 33.4  30.0 - 36.0 g/dL Final   RDW 07/19/2021 15.6 (A) 11.5 - 15.5 % Final   Platelets 07/19/2021 322  150 - 400 K/uL Final   nRBC 07/19/2021 0.0  0.0 - 0.2 % Final   Neutrophils Relative % 07/19/2021 50  % Final   Neutro Abs 07/19/2021 3.5  1.7 - 7.7 K/uL Final   Lymphocytes Relative 07/19/2021 37  % Final   Lymphs Abs 07/19/2021 2.5  0.7 - 4.0 K/uL Final   Monocytes Relative 07/19/2021 10  % Final  Monocytes Absolute 07/19/2021 0.7  0.1 - 1.0 K/uL Final   Eosinophils Relative 07/19/2021 2  % Final   Eosinophils Absolute 07/19/2021 0.2  0.0 - 0.5 K/uL Final   Basophils Relative 07/19/2021 1  % Final   Basophils  Absolute 07/19/2021 0.1  0.0 - 0.1 K/uL Final   Immature Granulocytes 07/19/2021 0  % Final   Abs Immature Granulocytes 07/19/2021 0.01  0.00 - 0.07 K/uL Final   Performed at [facility], [address] [place], [place] [address]    Assessment and Plan 1. Atypical ductal hyperplasia, breast   2. Ductal carcinoma in situ (DCIS) of left breast   3. Osteopenia, unspecified location   4. Hypokalemia    # Left breast DCIS-status post lumpectomy in May 2014.  Patient had radiation and finished 5 years of tamoxifen. Obtain annual mammogram screening.  #Left breast atypical ductal hyperplasia.  Patient is on Arimidex for chemoprevention.  Overall she tolerates very well. Labs reviewed and discussed with patient.  Continue Arimidex 1 mg daily.  Refills were sent to pharmacy.  #Osteopenia, recommend patient to take calcium and vitamin D supplementation.  May 2021 patient had a DEXA done at [facility].-showed osteopenia.  Recommend DEXA study every 2 years.  #Hypokalemia, potassium level 3.3.  Patient says that her primary care provider Dr. Edwina Barth suggested her to take potassium supplementation.  She is not currently taking but has to take potassium.  She has an appointment with Dr. Edwina Barth for follow-up. Encourage oral hydration.  I discussed the assessment and treatment plan with the patient.  The patient was provided an opportunity to ask questions and all were answered.  The patient agreed with the plan and demonstrated an understanding of the instructions.  The patient was advised to call back if the symptoms worsen or if the condition fails to improve as anticipated.  Follow-up in 1 year lab MD CBC CMP  Earlie Server, MD, PhD Hematology Oncology [place] at [facility] 07/19/2021

## 2021-08-29 ENCOUNTER — Other Ambulatory Visit: Payer: Self-pay | Admitting: Oncology

## 2021-09-01 IMAGING — MG MM PLC BREAST LOC DEV 1ST LESION INC MAMMO GUIDE*L*
7 series · 7 of 7 positions shown · non-contrast
Comparison: Previous exams.

CLINICAL DATA: Localization prior to surgery

EXAM:
NEEDLE LOCALIZATION OF THE LEFT BREAST WITH MAMMO GUIDANCE

[L CC (1 of 3)]
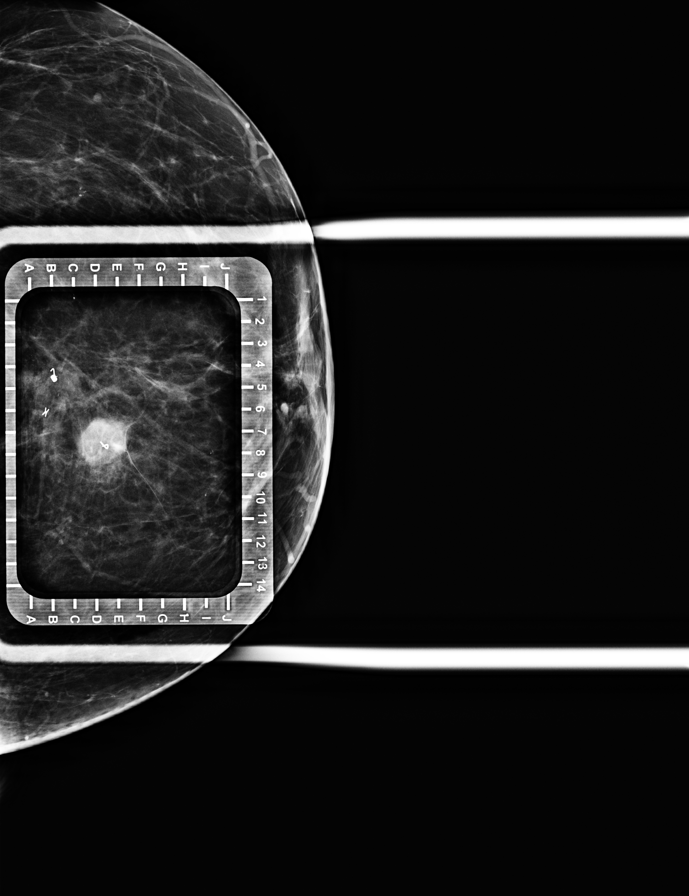

[L CC (2 of 3)]
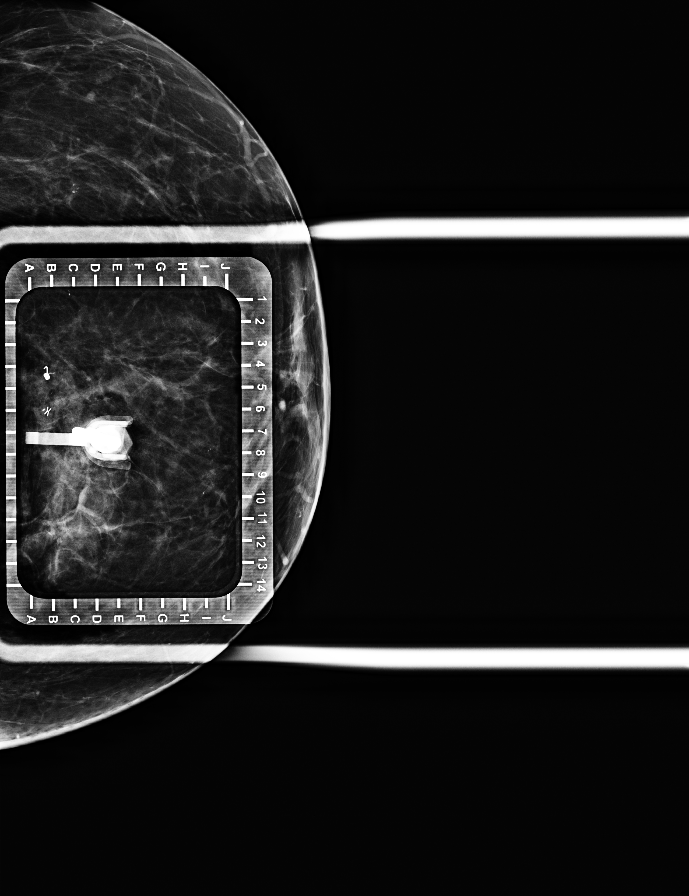

[L ML (1 of 4)]
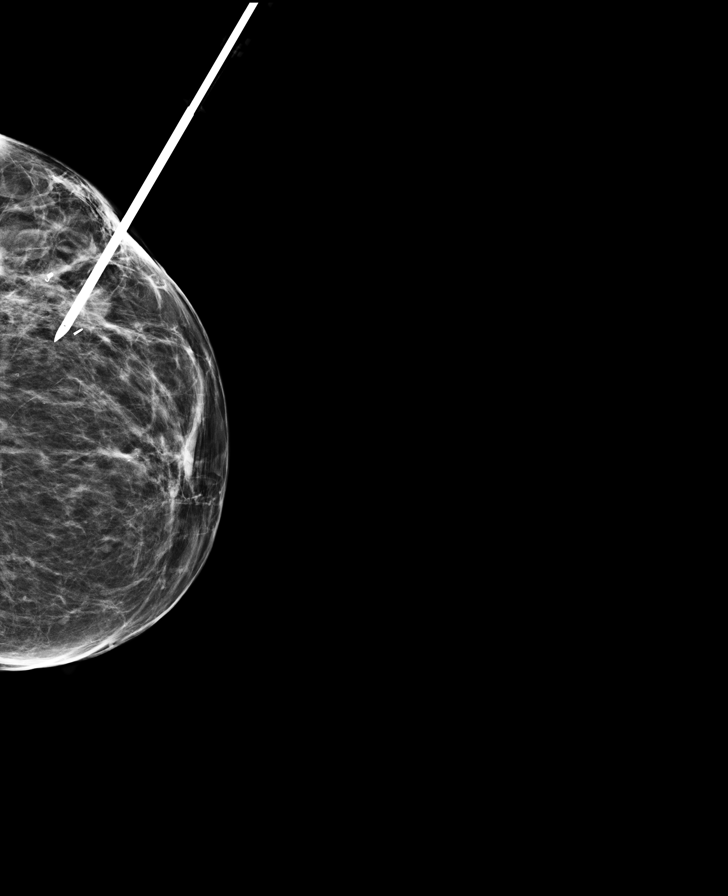

[L ML (2 of 4)]
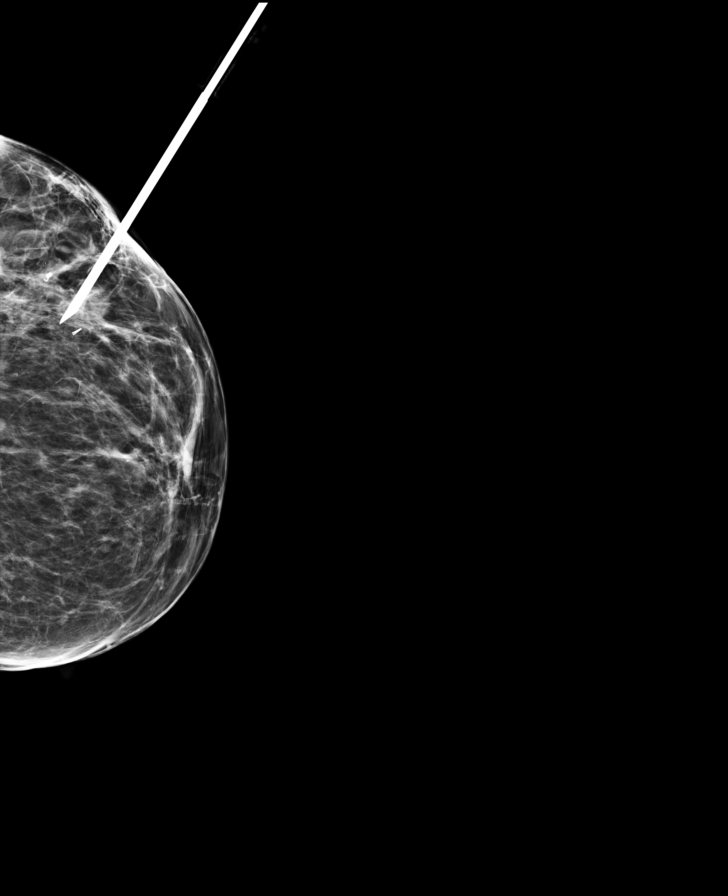

[L ML (3 of 4)]
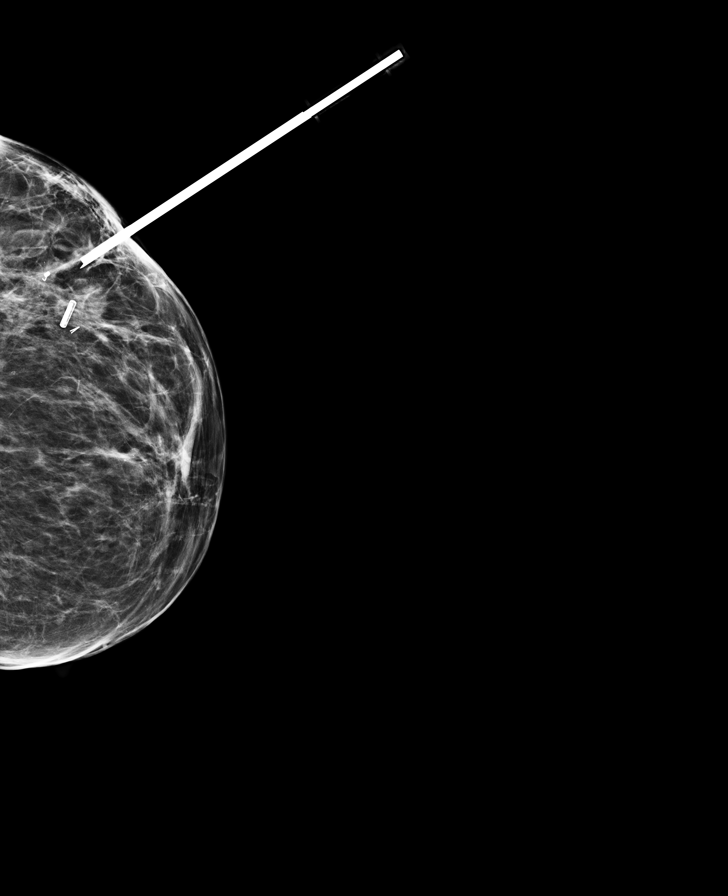

[L CC (3 of 3)]
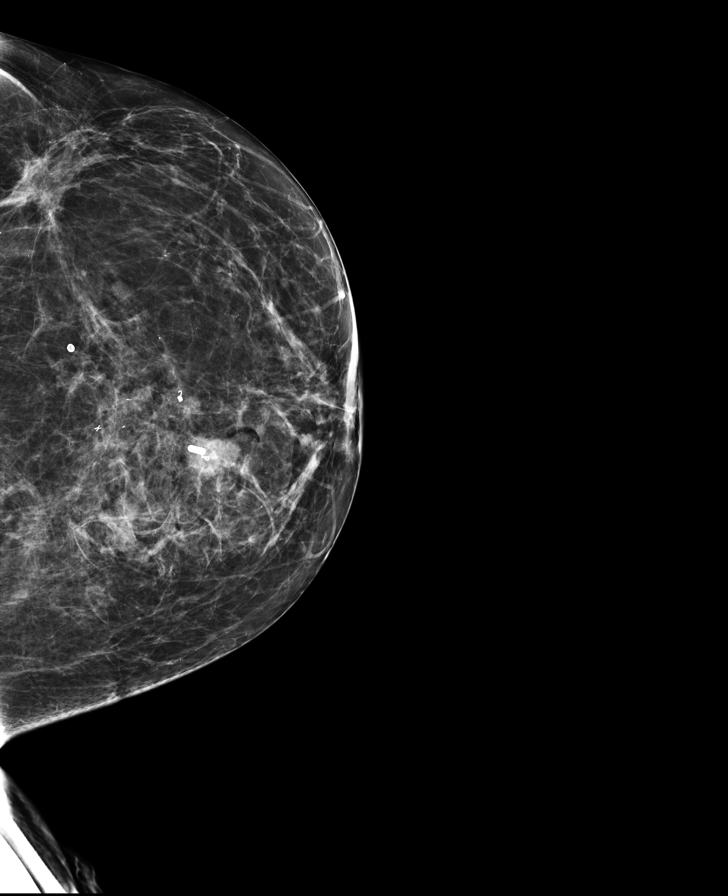

[L ML (4 of 4)]
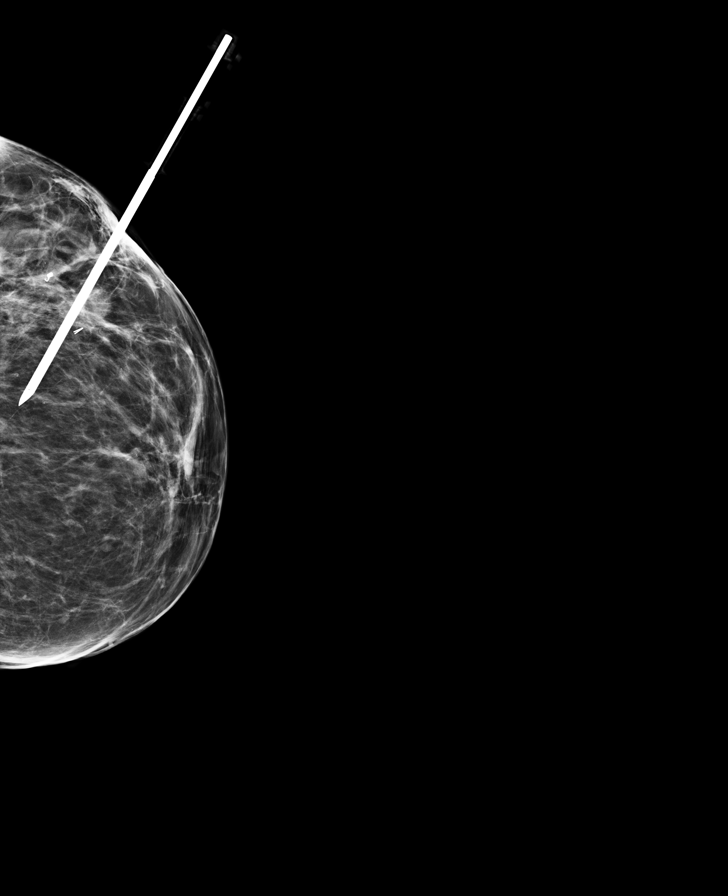

[7 of 7 positions shown; findings below may reference images not displayed]

FINDINGS: Patient presents for needle localization prior to surgery. I met
with the patient and we discussed the procedure of needle
localization including benefits and alternatives. We discussed the
high likelihood of a successful procedure. We discussed the risks of
the procedure, including infection, bleeding, tissue injury, and
further surgery. Informed, written consent was given. The usual
time-out protocol was performed immediately prior to the procedure.

Using mammographic guidance, sterile technique, 1% lidocaine and a 7
cm SCOUT needle, the ribbon shaped clip was localized using a
superior approach. Reflector function was confirmed with an auditory
signal from the SCOUT guide. The images were marked for the surgeon.
IMPRESSION: Radar reflector localization of the left breast. No apparent
complications.

## 2021-09-28 ENCOUNTER — Other Ambulatory Visit: Payer: Self-pay

## 2021-09-28 DIAGNOSIS — Z885 Allergy status to narcotic agent status: Secondary | ICD-10-CM

## 2021-09-28 DIAGNOSIS — I10 Essential (primary) hypertension: Secondary | ICD-10-CM | POA: Diagnosis present

## 2021-09-28 DIAGNOSIS — Z923 Personal history of irradiation: Secondary | ICD-10-CM

## 2021-09-28 DIAGNOSIS — Z7982 Long term (current) use of aspirin: Secondary | ICD-10-CM

## 2021-09-28 DIAGNOSIS — Z79899 Other long term (current) drug therapy: Secondary | ICD-10-CM

## 2021-09-28 DIAGNOSIS — Z881 Allergy status to other antibiotic agents status: Secondary | ICD-10-CM

## 2021-09-28 DIAGNOSIS — K5732 Diverticulitis of large intestine without perforation or abscess without bleeding: Principal | ICD-10-CM | POA: Diagnosis present

## 2021-09-28 DIAGNOSIS — E785 Hyperlipidemia, unspecified: Secondary | ICD-10-CM | POA: Diagnosis present

## 2021-09-28 DIAGNOSIS — Z79811 Long term (current) use of aromatase inhibitors: Secondary | ICD-10-CM

## 2021-09-28 DIAGNOSIS — H409 Unspecified glaucoma: Secondary | ICD-10-CM | POA: Diagnosis present

## 2021-09-28 DIAGNOSIS — K219 Gastro-esophageal reflux disease without esophagitis: Secondary | ICD-10-CM | POA: Diagnosis present

## 2021-09-28 DIAGNOSIS — M1990 Unspecified osteoarthritis, unspecified site: Secondary | ICD-10-CM | POA: Diagnosis present

## 2021-09-28 DIAGNOSIS — E119 Type 2 diabetes mellitus without complications: Secondary | ICD-10-CM | POA: Diagnosis present

## 2021-09-28 DIAGNOSIS — Z20822 Contact with and (suspected) exposure to covid-19: Secondary | ICD-10-CM | POA: Diagnosis present

## 2021-09-28 DIAGNOSIS — M8580 Other specified disorders of bone density and structure, unspecified site: Secondary | ICD-10-CM | POA: Diagnosis present

## 2021-09-28 DIAGNOSIS — Z8249 Family history of ischemic heart disease and other diseases of the circulatory system: Secondary | ICD-10-CM

## 2021-09-28 DIAGNOSIS — Z853 Personal history of malignant neoplasm of breast: Secondary | ICD-10-CM

## 2021-09-28 DIAGNOSIS — K5792 Diverticulitis of intestine, part unspecified, without perforation or abscess without bleeding: Secondary | ICD-10-CM | POA: Diagnosis not present

## 2021-09-28 DIAGNOSIS — Z833 Family history of diabetes mellitus: Secondary | ICD-10-CM

## 2021-09-28 DIAGNOSIS — Z803 Family history of malignant neoplasm of breast: Secondary | ICD-10-CM

## 2021-09-28 DIAGNOSIS — E876 Hypokalemia: Secondary | ICD-10-CM | POA: Diagnosis present

## 2021-09-28 LAB — URINALYSIS, ROUTINE W REFLEX MICROSCOPIC
Bacteria, UA: NONE SEEN
Bilirubin Urine: NEGATIVE
Glucose, UA: NEGATIVE mg/dL
Hgb urine dipstick: NEGATIVE
Ketones, ur: 5 mg/dL — AB
Leukocytes,Ua: NEGATIVE
Nitrite: NEGATIVE
Protein, ur: 30 mg/dL — AB
Specific Gravity, Urine: 1.019 (ref 1.005–1.030)
pH: 8 (ref 5.0–8.0)

## 2021-09-28 LAB — CBC
HCT: 35.3 % — ABNORMAL LOW (ref 36.0–46.0)
Hemoglobin: 11.8 g/dL — ABNORMAL LOW (ref 12.0–15.0)
MCH: 28.4 pg (ref 26.0–34.0)
MCHC: 33.4 g/dL (ref 30.0–36.0)
MCV: 85.1 fL (ref 80.0–100.0)
Platelets: 330 10*3/uL (ref 150–400)
RBC: 4.15 MIL/uL (ref 3.87–5.11)
RDW: 15.8 % — ABNORMAL HIGH (ref 11.5–15.5)
WBC: 7.4 10*3/uL (ref 4.0–10.5)
nRBC: 0 % (ref 0.0–0.2)

## 2021-09-28 LAB — COMPREHENSIVE METABOLIC PANEL
ALT: 10 U/L (ref 0–44)
AST: 16 U/L (ref 15–41)
Albumin: 4 g/dL (ref 3.5–5.0)
Alkaline Phosphatase: 81 U/L (ref 38–126)
Anion gap: 8 (ref 5–15)
BUN: 19 mg/dL (ref 8–23)
CO2: 29 mmol/L (ref 22–32)
Calcium: 9.8 mg/dL (ref 8.9–10.3)
Chloride: 102 mmol/L (ref 98–111)
Creatinine, Ser: 0.86 mg/dL (ref 0.44–1.00)
GFR, Estimated: >60 mL/min (ref >60)
Glucose, Bld: 112 mg/dL — ABNORMAL HIGH (ref 70–99)
Potassium: 3 mmol/L — ABNORMAL LOW (ref 3.5–5.1)
Sodium: 139 mmol/L (ref 135–145)
Total Bilirubin: 1.2 mg/dL (ref 0.3–1.2)
Total Protein: 8.4 g/dL — ABNORMAL HIGH (ref 6.5–8.1)

## 2021-09-28 LAB — LIPASE, BLOOD: Lipase: 25 U/L (ref 11–51)

## 2021-09-28 NOTE — ED Triage Notes (Signed)
Pt arrives with c/o lower ABD pain that started earlier today. Pt endorse nasuea. Pt denies fevers or illnesses recently.

## 2021-09-29 ENCOUNTER — Inpatient Hospital Stay: Payer: BC Managed Care – PPO

## 2021-09-29 ENCOUNTER — Encounter: Payer: Self-pay | Admitting: Family Medicine

## 2021-09-29 ENCOUNTER — Emergency Department: Payer: BC Managed Care – PPO

## 2021-09-29 ENCOUNTER — Inpatient Hospital Stay
Admission: EM | Admit: 2021-09-29 | Discharge: 2021-10-01 | DRG: 392 | Disposition: A | Discharge status: Home / Self Care | Payer: BC Managed Care – PPO | Attending: Internal Medicine | Admitting: Internal Medicine

## 2021-09-29 DIAGNOSIS — Z8249 Family history of ischemic heart disease and other diseases of the circulatory system: Secondary | ICD-10-CM | POA: Diagnosis not present

## 2021-09-29 DIAGNOSIS — Z79811 Long term (current) use of aromatase inhibitors: Secondary | ICD-10-CM | POA: Diagnosis not present

## 2021-09-29 DIAGNOSIS — E876 Hypokalemia: Secondary | ICD-10-CM | POA: Diagnosis present

## 2021-09-29 DIAGNOSIS — K5712 Diverticulitis of small intestine without perforation or abscess without bleeding: Secondary | ICD-10-CM

## 2021-09-29 DIAGNOSIS — Z853 Personal history of malignant neoplasm of breast: Secondary | ICD-10-CM | POA: Diagnosis not present

## 2021-09-29 DIAGNOSIS — Z923 Personal history of irradiation: Secondary | ICD-10-CM | POA: Diagnosis not present

## 2021-09-29 DIAGNOSIS — H409 Unspecified glaucoma: Secondary | ICD-10-CM | POA: Diagnosis present

## 2021-09-29 DIAGNOSIS — Z7982 Long term (current) use of aspirin: Secondary | ICD-10-CM | POA: Diagnosis not present

## 2021-09-29 DIAGNOSIS — E785 Hyperlipidemia, unspecified: Secondary | ICD-10-CM

## 2021-09-29 DIAGNOSIS — K5732 Diverticulitis of large intestine without perforation or abscess without bleeding: Secondary | ICD-10-CM | POA: Diagnosis present

## 2021-09-29 DIAGNOSIS — K5792 Diverticulitis of intestine, part unspecified, without perforation or abscess without bleeding: Principal | ICD-10-CM

## 2021-09-29 DIAGNOSIS — M8580 Other specified disorders of bone density and structure, unspecified site: Secondary | ICD-10-CM | POA: Diagnosis present

## 2021-09-29 DIAGNOSIS — Z20822 Contact with and (suspected) exposure to covid-19: Secondary | ICD-10-CM | POA: Diagnosis present

## 2021-09-29 DIAGNOSIS — Z833 Family history of diabetes mellitus: Secondary | ICD-10-CM | POA: Diagnosis not present

## 2021-09-29 DIAGNOSIS — Z803 Family history of malignant neoplasm of breast: Secondary | ICD-10-CM | POA: Diagnosis not present

## 2021-09-29 DIAGNOSIS — Z881 Allergy status to other antibiotic agents status: Secondary | ICD-10-CM | POA: Diagnosis not present

## 2021-09-29 DIAGNOSIS — M1990 Unspecified osteoarthritis, unspecified site: Secondary | ICD-10-CM | POA: Diagnosis present

## 2021-09-29 DIAGNOSIS — K219 Gastro-esophageal reflux disease without esophagitis: Secondary | ICD-10-CM | POA: Diagnosis present

## 2021-09-29 DIAGNOSIS — K56609 Unspecified intestinal obstruction, unspecified as to partial versus complete obstruction: Secondary | ICD-10-CM | POA: Diagnosis not present

## 2021-09-29 DIAGNOSIS — I10 Essential (primary) hypertension: Secondary | ICD-10-CM | POA: Diagnosis present

## 2021-09-29 DIAGNOSIS — Z885 Allergy status to narcotic agent status: Secondary | ICD-10-CM | POA: Diagnosis not present

## 2021-09-29 DIAGNOSIS — Z79899 Other long term (current) drug therapy: Secondary | ICD-10-CM | POA: Diagnosis not present

## 2021-09-29 DIAGNOSIS — E119 Type 2 diabetes mellitus without complications: Secondary | ICD-10-CM | POA: Diagnosis present

## 2021-09-29 LAB — CBC
HCT: 37.7 % (ref 36.0–46.0)
Hemoglobin: 12.5 g/dL (ref 12.0–15.0)
MCH: 28.5 pg (ref 26.0–34.0)
MCHC: 33.2 g/dL (ref 30.0–36.0)
MCV: 86.1 fL (ref 80.0–100.0)
Platelets: 321 10*3/uL (ref 150–400)
RBC: 4.38 MIL/uL (ref 3.87–5.11)
RDW: 15.8 % — ABNORMAL HIGH (ref 11.5–15.5)
WBC: 9.5 10*3/uL (ref 4.0–10.5)
nRBC: 0 % (ref 0.0–0.2)

## 2021-09-29 LAB — BASIC METABOLIC PANEL
Anion gap: 7 (ref 5–15)
BUN: 15 mg/dL (ref 8–23)
CO2: 29 mmol/L (ref 22–32)
Calcium: 9.4 mg/dL (ref 8.9–10.3)
Chloride: 97 mmol/L — ABNORMAL LOW (ref 98–111)
Creatinine, Ser: 0.72 mg/dL (ref 0.44–1.00)
GFR, Estimated: >60 mL/min (ref >60)
Glucose, Bld: 82 mg/dL (ref 70–99)
Potassium: 2.7 mmol/L — CL (ref 3.5–5.1)
Sodium: 133 mmol/L — ABNORMAL LOW (ref 135–145)

## 2021-09-29 LAB — CBG MONITORING, ED
Glucose-Capillary: 65 mg/dL — ABNORMAL LOW (ref 70–99)
Glucose-Capillary: 78 mg/dL (ref 70–99)

## 2021-09-29 LAB — RESP PANEL BY RT-PCR (FLU A&B, COVID) ARPGX2
Influenza A by PCR: NEGATIVE
Influenza B by PCR: NEGATIVE
SARS Coronavirus 2 by RT PCR: NEGATIVE

## 2021-09-29 LAB — MAGNESIUM: Magnesium: 1.9 mg/dL (ref 1.7–2.4)

## 2021-09-29 LAB — GLUCOSE, CAPILLARY: Glucose-Capillary: 75 mg/dL (ref 70–99)

## 2021-09-29 LAB — LACTIC ACID, PLASMA: Lactic Acid, Venous: 1.3 mmol/L (ref 0.5–1.9)

## 2021-09-29 MED ORDER — ONDANSETRON HCL 4 MG PO TABS: 4.0000 mg | ORAL_TABLET | Freq: Four times a day (QID) | ORAL | Status: DC | PRN

## 2021-09-29 MED ORDER — POTASSIUM CHLORIDE 20 MEQ PO PACK
40.0000 meq | PACK | Freq: Once | ORAL | Status: AC
  Administered 2021-09-29: 40 meq via ORAL
  Filled 2021-09-29: qty 2

## 2021-09-29 MED ORDER — ENOXAPARIN SODIUM 40 MG/0.4ML IJ SOSY
40.0000 mg | PREFILLED_SYRINGE | INTRAMUSCULAR | Status: DC
  Administered 2021-09-29 – 2021-10-01 (×3): 40 mg via SUBCUTANEOUS
  Filled 2021-09-29 (×3): qty 0.4

## 2021-09-29 MED ORDER — DORZOLAMIDE HCL-TIMOLOL MAL 2-0.5 % OP SOLN
1.0000 [drp] | Freq: Two times a day (BID) | OPHTHALMIC | Status: DC
  Administered 2021-09-29 – 2021-10-01 (×4): 1 [drp] via OPHTHALMIC
  Filled 2021-09-29: qty 10

## 2021-09-29 MED ORDER — PANTOPRAZOLE SODIUM 40 MG PO TBEC
40.0000 mg | DELAYED_RELEASE_TABLET | Freq: Every day | ORAL | Status: DC
  Administered 2021-09-29 – 2021-10-01 (×3): 40 mg via ORAL
  Filled 2021-09-29 (×3): qty 1

## 2021-09-29 MED ORDER — ANASTROZOLE 1 MG PO TABS
1.0000 mg | ORAL_TABLET | Freq: Every day | ORAL | Status: DC
  Administered 2021-09-30 – 2021-10-01 (×2): 1 mg via ORAL
  Filled 2021-09-29 (×3): qty 1

## 2021-09-29 MED ORDER — VERAPAMIL HCL ER 240 MG PO TBCR
240.0000 mg | EXTENDED_RELEASE_TABLET | Freq: Every day | ORAL | Status: DC
  Administered 2021-09-30 – 2021-10-01 (×2): 240 mg via ORAL
  Filled 2021-09-29 (×3): qty 1

## 2021-09-29 MED ORDER — LACTATED RINGERS IV SOLN: INTRAVENOUS | Status: DC

## 2021-09-29 MED ORDER — POTASSIUM CHLORIDE IN NACL 20-0.9 MEQ/L-% IV SOLN
INTRAVENOUS | Status: DC
  Filled 2021-09-29 (×7): qty 1000

## 2021-09-29 MED ORDER — VITAMIN D 25 MCG (1000 UNIT) PO TABS
2000.0000 [IU] | ORAL_TABLET | Freq: Every day | ORAL | Status: DC
Start: 2021-09-29 — End: 2021-10-01
  Administered 2021-09-29 – 2021-10-01 (×3): 2000 [IU] via ORAL
  Filled 2021-09-29 (×3): qty 2

## 2021-09-29 MED ORDER — MAGNESIUM HYDROXIDE 400 MG/5ML PO SUSP: 30.0000 mL | Freq: Every day | ORAL | Status: DC | PRN

## 2021-09-29 MED ORDER — PIPERACILLIN-TAZOBACTAM 3.375 G IVPB 30 MIN
3.3750 g | Freq: Once | INTRAVENOUS | Status: AC
  Administered 2021-09-29: 3.375 g via INTRAVENOUS
  Filled 2021-09-29: qty 50

## 2021-09-29 MED ORDER — LOSARTAN POTASSIUM-HCTZ 100-25 MG PO TABS: 1.0000 | ORAL_TABLET | Freq: Every day | ORAL | Status: DC

## 2021-09-29 MED ORDER — ACETAMINOPHEN 325 MG PO TABS: 650.0000 mg | ORAL_TABLET | Freq: Four times a day (QID) | ORAL | Status: DC | PRN

## 2021-09-29 MED ORDER — ATORVASTATIN CALCIUM 10 MG PO TABS
10.0000 mg | ORAL_TABLET | Freq: Every day | ORAL | Status: DC
  Administered 2021-09-29 – 2021-10-01 (×3): 10 mg via ORAL
  Filled 2021-09-29 (×3): qty 1

## 2021-09-29 MED ORDER — ONDANSETRON HCL 4 MG/2ML IJ SOLN: 4.0000 mg | Freq: Four times a day (QID) | INTRAMUSCULAR | Status: DC | PRN

## 2021-09-29 MED ORDER — LATANOPROST 0.005 % OP SOLN
1.0000 [drp] | Freq: Every day | OPHTHALMIC | Status: DC
  Administered 2021-09-29 – 2021-09-30 (×2): 1 [drp] via OPHTHALMIC
  Filled 2021-09-29: qty 2.5

## 2021-09-29 MED ORDER — IOHEXOL 300 MG/ML  SOLN
100.0000 mL | Freq: Once | INTRAMUSCULAR | Status: AC | PRN
  Administered 2021-09-29: 100 mL via INTRAVENOUS

## 2021-09-29 MED ORDER — PIPERACILLIN-TAZOBACTAM 3.375 G IVPB 30 MIN: 3.3750 g | Freq: Four times a day (QID) | INTRAVENOUS | Status: DC

## 2021-09-29 MED ORDER — INSULIN ASPART 100 UNIT/ML IJ SOLN
0.0000 [IU] | Freq: Three times a day (TID) | INTRAMUSCULAR | Status: DC
  Filled 2021-09-29 (×2): qty 1

## 2021-09-29 MED ORDER — TRAZODONE HCL 50 MG PO TABS: 25.0000 mg | ORAL_TABLET | Freq: Every evening | ORAL | Status: DC | PRN

## 2021-09-29 MED ORDER — PIPERACILLIN-TAZOBACTAM 3.375 G IVPB
3.3750 g | Freq: Three times a day (TID) | INTRAVENOUS | Status: DC
  Administered 2021-09-29 – 2021-10-01 (×6): 3.375 g via INTRAVENOUS
  Filled 2021-09-29 (×7): qty 50

## 2021-09-29 MED ORDER — HYDROCHLOROTHIAZIDE 25 MG PO TABS
25.0000 mg | ORAL_TABLET | Freq: Every day | ORAL | Status: DC
  Administered 2021-09-29 – 2021-10-01 (×3): 25 mg via ORAL
  Filled 2021-09-29 (×3): qty 1

## 2021-09-29 MED ORDER — LOSARTAN POTASSIUM 50 MG PO TABS
100.0000 mg | ORAL_TABLET | Freq: Every day | ORAL | Status: DC
  Administered 2021-09-29 – 2021-10-01 (×3): 100 mg via ORAL
  Filled 2021-09-29 (×3): qty 2

## 2021-09-29 MED ORDER — MORPHINE SULFATE (PF) 2 MG/ML IV SOLN: 2.0000 mg | INTRAVENOUS | Status: DC | PRN

## 2021-09-29 MED ORDER — LACTATED RINGERS IV BOLUS
1000.0000 mL | Freq: Once | INTRAVENOUS | Status: AC
  Administered 2021-09-29: 1000 mL via INTRAVENOUS

## 2021-09-29 MED ORDER — ACETAMINOPHEN 650 MG RE SUPP: 650.0000 mg | Freq: Four times a day (QID) | RECTAL | Status: DC | PRN

## 2021-09-29 MED ORDER — POTASSIUM CHLORIDE 20 MEQ PO PACK: 40.0000 meq | PACK | Freq: Once | ORAL | Status: DC

## 2021-09-29 NOTE — H&P (Addendum)
[place]   PATIENT NAME: Katie Thompson    MR#:  [id number]  DATE OF BIRTH:  [birth year]  DATE OF ADMISSION:  09/29/2021  PRIMARY CARE PHYSICIAN: Baxter Hire, MD   Patient is coming from: Home  REQUESTING/REFERRING PHYSICIAN: Rudene Re, MD  CHIEF COMPLAINT:   Chief Complaint  Patient presents with   Abdominal Pain    HISTORY OF PRESENT ILLNESS:  Katie Thompson is a 75 y.o. African-American female with medical history significant for GERD, hypertension, dyslipidemia and diverticulosis as well as type 2 diabetes mellitus, who presented to the ER with acute onset of lower abdominal pain that started yesterday with radiation to her back.  Her last bowel movement was yesterday.  She denies any nausea or vomiting or diarrhea.  No melena or bright red bleeding per rectum.  She denies any fever or chills.  No chest pain or dyspnea or cough or wheezing.  No bleeding diathesis.  ED Course: When the patient came to the ER blood pressure was 144/84 and later 158/83 with otherwise normal vital signs.  Labs revealed hypokalemia of 3 with otherwise unremarkable CMP.  CBC showed mild anemia close to baseline.  UA showed 30 protein and was otherwise unremarkable.  Imaging: Abdominal pelvic CT scan revealed the following: 1. Diffuse diverticulosis, with short-segment of the distal descending colon with adjacent mesenteric haziness consistent with mild acute diverticulitis. No diverticular abscess or free air. 2. Dilated and thickened small bowel segments extending lateral to the ascending colon consistent with transmesenteric internal hernia. Findings are concerning for a closed loop small bowel obstruction. No upstream or downstream bowel dilatation is seen. The wall thickening could be inflammatory or ischemic and there is adjacent mesenteric edema but no visible pneumatosis. 3. Left renal cysts. 4. 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 5. Fibroid uterus with suspected thickened endometrium. Ultrasound follow-up recommended. 6. Osteopenia and degenerative change. 7. Nonobstructive umbilical hernia containing a short knuckle of small bowel. 8. Tissue asymmetry superior left breast, apparently underwent surgical biopsy 02/28/2021 with unknown results. 9. Small hepatic cysts and additional too small to characterize subcentimeter hypodensities.  The patient was given IV Zosyn and 1 L bolus of IV lactated Ringer followed by 125 mL/h.  She will be admitted to a medical bed for further evaluation and management.  PAST MEDICAL HISTORY:   Past Medical History:  Diagnosis Date   Arthritis    Breast cancer ([place]) 2014   LT with radiation   Diabetes mellitus without complication ([place])    diet controlled no meds   Diverticulosis feb. 2015   from colonoscopy   GERD (gastroesophageal reflux disease)    History of hiatal hernia    Hyperlipidemia    Hypertension    Personal history of radiation therapy 2014   DCIS left breast    PAST SURGICAL HISTORY:   Past Surgical History:  Procedure Laterality Date   APPENDECTOMY     BREAST BIOPSY Left 2016   coil marker, BENIGN BREAST TISSUE WITH CLUSTERED MICROCYSTS AND STROMAL FIBROSIS.    BREAST BIOPSY Left 01/05/2018   Affirm Bx- x marker, FIBROCYSTIC CHANGE WITH MICROCALCIFICATIONS   BREAST BIOPSY Left 02/04/2013   DCIS   BREAST BIOPSY Left 01/29/2021   Stereo Bx, X-Clip, INTRADUCTAL PAPILLOMA WITH SCLEROSING ADENOSIS    BREAST BIOPSY WITH RADIO FREQUENCY LOCALIZER Left 02/28/2021   Procedure: BREAST BIOPSY WITH RADIO FREQUENCY LOCALIZER;  Surgeon: Herbert Pun, MD;  Location: [facility] ORS;  Service: General;  Laterality: Left;   BREAST EXCISIONAL BIOPSY Left 07/29/2017   SCLEROTIC INTRADUCTAL PAPILLOMA.   BREAST LUMPECTOMY Left 2014   DCIS with clear margins.    BTL     EXCISION OF BREAST LESION Left 07/29/2017   Procedure:  EXCISION OF BREAST MASS;  Surgeon: Leonie Green, MD;  Location: [facility] ORS;  Service: General;  Laterality: Left;   HERNIA REPAIR     NASAL SINUS SURGERY      SOCIAL HISTORY:   Social History   Tobacco Use   Smoking status: Never   Smokeless tobacco: Never  Substance Use Topics   Alcohol use: No    FAMILY HISTORY:   Family History  Problem Relation Age of Onset   Ovarian cancer Mother    Hypertension Mother    Diabetes Paternal Grandmother    Hypertension Paternal Grandmother    Breast cancer Paternal Aunt 35    DRUG ALLERGIES:   Allergies  Allergen Reactions   Codeine Nausea And Vomiting   Gatifloxacin Hives    REVIEW OF SYSTEMS:   ROS As per history of present illness. All pertinent systems were reviewed above. Constitutional, HEENT, cardiovascular, respiratory, GI, GU, musculoskeletal, neuro, psychiatric, endocrine, integumentary and hematologic systems were reviewed and are otherwise negative/unremarkable except for positive findings mentioned above in the HPI.   MEDICATIONS AT HOME:   Prior to Admission medications   Medication Sig Start Date End Date Taking? Authorizing Provider  anastrozole (ARIMIDEX) 1 MG tablet TAKE 1 TABLET(1 MG) BY MOUTH DAILY 08/29/21  Yes Earlie Server, MD  aspirin EC 81 MG tablet Take 81 mg by mouth daily.  09/19/14  Yes [provider]  atorvastatin (LIPITOR) 10 MG tablet Take 10 mg by mouth every morning. 09/19/14  Yes [provider]  Cholecalciferol (VITAMIN D3) 50 MCG (2000 UT) capsule Take 2,000 Units by mouth daily. 05/16/21  Yes [provider]  Cyanocobalamin (VITAMIN B12 PO) Take 1 tablet by mouth daily.   Yes [provider]  dorzolamide-timolol (COSOPT) 22.3-6.8 MG/ML ophthalmic solution 1 drop 2 (two) times daily. 08/03/21  Yes [provider]  latanoprost (XALATAN) 0.005 % ophthalmic solution Place 1 drop into the left eye at bedtime. 12/12/20  Yes [provider]  losartan-hydrochlorothiazide (HYZAAR) 100-25 MG tablet Take 1 tablet by mouth daily. 09/12/21  Yes [provider]  Omeprazole 20 MG TBEC Take 20 mg by mouth every morning.   Yes [provider]  verapamil (CALAN-SR) 240 MG CR tablet Take 240 mg by mouth every morning. 11/18/14  Yes [provider]  calcium carbonate (TUMS - DOSED IN MG ELEMENTAL CALCIUM) 500 MG chewable tablet Chew 1 tablet by mouth 2 (two) times daily. Patient not taking: Reported on 06/07/2021    [provider]  traMADol (ULTRAM) 50 MG tablet Take 1 tablet (50 mg total) by mouth every 6 (six) hours as needed. Patient not taking: Reported on 06/07/2021 02/28/21 02/28/22  Herbert Pun, MD      VITAL SIGNS:  Blood pressure (!) 158/83, pulse 72, temperature 98.8 F (37.1 C), temperature source Oral, resp. rate 17, height 5\' 3"  (1.6 m), weight 82.1 kg, SpO2 97 %.  PHYSICAL EXAMINATION:  Physical Exam  GENERAL:  75 y.o.-year-old African-American female patient lying in the bed with no acute distress.  EYES: Pupils equal, round, reactive to light and accommodation. No scleral icterus. Extraocular muscles intact.  HEENT: Head atraumatic, normocephalic. Oropharynx and nasopharynx clear.  NECK:  Supple, no jugular venous  distention. No thyroid enlargement, no tenderness.  LUNGS: Normal breath sounds bilaterally, no wheezing, rales,rhonchi or crepitation. No use of accessory muscles of respiration.  CARDIOVASCULAR: Regular rate and rhythm, S1, S2 normal. No murmurs, rubs, or gallops.  ABDOMEN: Soft, nondistended, with mild lower abdominal tenderness without rebound tenderness guarding or rigidity.. Bowel sounds present. No organomegaly or mass.  EXTREMITIES: No pedal edema, cyanosis, or clubbing.  NEUROLOGIC: Cranial nerves II through XII are intact. Muscle strength 5/5 in all extremities. Sensation intact. Gait not checked.  PSYCHIATRIC: The patient is alert and oriented x 3.  Normal  affect and good eye contact. SKIN: No obvious rash, lesion, or ulcer.   LABORATORY PANEL:   CBC Recent Labs  Lab 09/28/21 2018  WBC 7.4  HGB 11.8*  HCT 35.3*  PLT 330   ------------------------------------------------------------------------------------------------------------------  Chemistries  Recent Labs  Lab 09/28/21 2018  NA 139  K 3.0*  CL 102  CO2 29  GLUCOSE 112*  BUN 19  CREATININE 0.86  CALCIUM 9.8  AST 16  ALT 10  ALKPHOS 81  BILITOT 1.2   ------------------------------------------------------------------------------------------------------------------  Cardiac Enzymes No results for input(s): TROPONINI in the last 168 hours. ------------------------------------------------------------------------------------------------------------------  RADIOLOGY:  CT ABDOMEN PELVIS WO CONTRAST  Result Date: 09/29/2021 CLINICAL DATA:  Left lower quadrant pain. EXAM: CT ABDOMEN AND PELVIS WITHOUT CONTRAST TECHNIQUE: Multidetector CT imaging of the abdomen and pelvis was performed following the standard protocol without IV contrast. COMPARISON:  None. FINDINGS: Lower chest: A tissue asymmetry is noted in the superior left breast. Reportedly this underwent biopsy on 02/28/2021 with unknown results. Please correlate with the surgical/pathologic record. There is linear atelectasis in the lower lobes without lung base infiltrates. Small hiatal hernia. The cardiac size is normal. No pericardial fluid is seen. There is significant elevation of the right hemidiaphragm, majority of liver intrathoracic, consistent with eventration or diaphragmatic paresis. Hepatobiliary: There is a 1.3 cm cyst of 18 Hounsfield units in the medial aspect of the anterior segment of the right lobe of the liver, a 1.3 cm cyst of 19.7 Hounsfield units in the lateral segment left lobe, a 1.2 cm cyst of 19.5 Hounsfield units more laterally in the left lobe lateral segment, and a few additional left and right  lower parenchymal hypodensities which are too small to characterize. No mass is seen without contrast. The gallbladder and bile ducts are unremarkable. Pancreas: No mass or ductal dilatation visible without contrast. Spleen: Unremarkable without contrast. Adrenals/Urinary Tract: There is no adrenal mass. Large 9.7 cm thin walled cyst noted upper pole left kidney of 1.9 Hounsfield units, inferior to this is a left upper pole parapelvic cyst of 4.6 cm and 3.6 Hounsfield units. No other renal cortical abnormality is seen. No urinary stone or hydronephrosis. Normal bladder. Stomach/Bowel: Mild-to-moderate fold thickening in the proximal to mid stomach. The small bowel is normal in caliber except for mildly dilated thickened segments up to 3.3 cm caliber, with mesenteric edema, extending lateral to the ascending colon through a transmesenteric internal hernia of the ascending mesocolon. The wall thickening in the segments could be inflammatory or ischemic but there is no portal venous gas or appreciable wall pneumatosis. A short nonobstructed knuckle of the small bowel extends into and out of a small umbilical fat hernia. There are diffuse colonic diverticula. Adjacent mesenteric stranding is noted alongside the distal descending colon consistent with acute diverticulitis over short-segment. No diverticular abscess is seen. There is mild-to-moderate constipation transverse and left-sided colon and rectum. Vascular/Lymphatic: Aortic atherosclerosis. No enlarged  abdominal or pelvic lymph nodes. 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 Reproductive: The uterus is intact and no adnexal masses seen. There is a dorsal body of the uterus subserosal fibroid measuring 3.3 cm. The endometrium appears thickened warranting ultrasound follow-up but this can be overestimated on CT. There is no free air, hemorrhage or fluid. Other: There is evidence of a left inguinal hernia repair with mesh screws  in place. Umbilical hernia mostly contains fat as well as a small bowel loop which is nonobstructed. Right inguinal fat hernia noted. Musculoskeletal: Osteopenia with degenerative changes of the spine. There is multilevel lumbar spinous process abutment and spurring, mild hip DJD. Bilateral sacroiliitis with chronic appearance. IMPRESSION: 1. Diffuse diverticulosis, with short-segment of the distal descending colon with adjacent mesenteric haziness consistent with mild acute diverticulitis. No diverticular abscess or free air. 2. Dilated and thickened small bowel segments extending lateral to the ascending colon consistent with transmesenteric internal hernia. Findings are concerning for a closed loop small bowel obstruction. No upstream or downstream bowel dilatation is seen. The wall thickening could be inflammatory or ischemic and there is adjacent mesenteric edema but no visible pneumatosis. 3. Left renal cysts. 4. 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 5. Fibroid uterus with suspected thickened endometrium. Ultrasound follow-up recommended. 6. Osteopenia and degenerative change. 7. Nonobstructive umbilical hernia containing a short knuckle of small bowel. 8. Tissue asymmetry superior left breast, apparently underwent surgical biopsy 02/28/2021 with unknown results. 9. Small hepatic cysts and additional too small to characterize subcentimeter hypodensities. Electronically Signed   By: Telford Nab M.D.   On: 09/29/2021 02:10      IMPRESSION AND PLAN:  Principal Problem:   Acute diverticulitis  1.  Acute distal descending colon diverticulitis with associated diffuse diverticulosis. - The patient will be admitted to a med-surgical bed. - Pain management will be provided. - We will continue IV Zosyn.  2.  Small bowel obstruction. - She will be kept NPO. - We will repeat two-view abdomen x-ray. - She will be hydrated with IV normal saline. - We will optimize  electrolytes. - We will hold off aspirin. - General surgery consult will be obtained - I notified Dr. Okey Dupre about the patient.  3.  Hypokalemia. - Potassium will be replaced and magnesium level will be checked.  4.  Dyslipidemia. - We will continue statin therapy.  4.  History of breast cancer. - We will continue Arimidex.  5.  Essential hypertension. - We will continue Calan SR and Hyzaar.  6.  Glaucoma. - We will continue her ophthalmic gtt.'s.  7.  GERD. - We will continue PPI therapy.  8.  Type 2 diabetes mellitus. - The patient will be placed on supplement coverage with NovoLog.  DVT prophylaxis: Lovenox. Code Status: full code. Family Communication:  The plan of care was discussed in details with the patient (and family). I answered all questions. The patient agreed to proceed with the above mentioned plan. Further management will depend upon hospital course. Disposition Plan: Back to previous home environment Consults called: Neurosurgery.  All the records are reviewed and case discussed with ED provider.  Status is: Inpatient   Remains inpatient appropriate because:Ongoing diagnostic testing needed not appropriate for outpatient work up, Unsafe d/c plan, IV treatments appropriate due to intensity of illness or inability to take PO, and Inpatient level of care appropriate due to severity of illness   Dispo: The patient is from: Home  Anticipated d/c is to: Home              Patient currently is not medically stable to d/c.              Difficult to place patient: No     Christel Mormon M.D on 09/29/2021 at 4:31 AM  [facility]   From 7 PM-7 AM, contact night-coverage [email or web address]  CC: Primary care physician; Baxter Hire, MD

## 2021-09-29 NOTE — Plan of Care (Signed)

## 2021-09-29 NOTE — Progress Notes (Signed)
DAY ZERO NOTE    Katie Thompson  OVF:[id number] DOB: 06-05-1946 DOA: 09/29/2021 PCP: Baxter Hire, MD   Brief Narrative:  Katie Thompson is a 75 y.o. African-American female with medical history significant for GERD, hypertension, dyslipidemia and diverticulosis as well as type 2 diabetes mellitus, who presented to the ER with acute onset of lower abdominal pain that started yesterday with radiation to her back - imaging and evaluation at intake concerning for acute diverticulitis with possible SBO. Gen Surgery consulted to follow along.  Assessment & Plan:   Acute distal descending colon diverticulitis with associated diffuse diverticulosis. Rule out small bowel obstruction -General surgery following, appreciate insight and recommendations -Patient remains n.p.o., continue IV fluids and care -NG tube not currently necessary given well-controlled symptoms -Continue antibiotics with Zosyn to cover abdomen   Hypokalemia. - Potassium will be replaced and magnesium level will be checked.   Dyslipidemia. - continue statin therapy.  History of breast cancer. - continue Arimidex.  Essential hypertension. - continue Calan SR and Hyzaar.  Glaucoma. - continue her ophthalmic gtt.'s.  GERD. - continue PPI therapy.  Type 2 diabetes mellitus, non-insulin-dependent -Continue sliding scale insulin, hypoglycemic protocol  DVT prophylaxis: Lovenox. Code Status: full code. Family Communication:  The plan of care was discussed in details with the patient (and family).  Status is: Inpatient  Dispo: The patient is from: Home              Anticipated d/c is to: Home              Anticipated d/c date is: 48 to 72 hours              Patient currently not medically stable for discharge  Consultants:  General surgery  Procedures:  None  Antimicrobials:  Zosyn  Subjective: No acute issues or events overnight, abdominal pain and nausea currently well controlled denies headache  fever chills chest pain or shortness of breath -family requesting diet which we discussed was not appropriate at this time given ongoing small bowel obstruction  Objective: Vitals:   09/28/21 2013 09/28/21 2014 09/29/21 0014  BP: (!) 144/84  (!) 158/83  Pulse: 67  72  Resp: 17  17  Temp: 98.8 F (37.1 C)    TempSrc: Oral    SpO2: 96%  97%  Weight:  82.1 kg   Height:  5\' 3"  (1.6 m)     Intake/Output Summary (Last 24 hours) at 09/29/2021 0831 Last data filed at 09/29/2021 0437 Gross per 24 hour  Intake 50 ml  Output --  Net 50 ml   Filed Weights   09/28/21 2014  Weight: 82.1 kg    Examination:  General:  Pleasantly resting in bed, No acute distress. HEENT:  Normocephalic atraumatic.  Sclerae nonicteric, noninjected.  Extraocular movements intact bilaterally. Neck:  Without mass or deformity.  Trachea is midline. Lungs:  Clear to auscultate bilaterally without rhonchi, wheeze, or rales. Heart:  Regular rate and rhythm.  Without murmurs, rubs, or gallops. Abdomen:  Soft, nontender, nondistended.  Without guarding or rebound. Extremities: Without cyanosis, clubbing, edema, or obvious deformity. Vascular:  Dorsalis pedis and posterior tibial pulses palpable bilaterally. Skin:  Warm and dry, no erythema, no ulcerations.  Data Reviewed: I have personally reviewed following labs and imaging studies  CBC: Recent Labs  Lab 09/28/21 2018  WBC 7.4  HGB 11.8*  HCT 35.3*  MCV 85.1  PLT 841   Basic Metabolic Panel: Recent Labs  Lab 09/28/21 2018  NA 139  K 3.0*  CL 102  CO2 29  GLUCOSE 112*  BUN 19  CREATININE 0.86  CALCIUM 9.8   GFR: Estimated Creatinine Clearance: 55.6 mL/min (by C-G formula based on SCr of 0.86 mg/dL). Liver Function Tests: Recent Labs  Lab 09/28/21 2018  AST 16  ALT 10  ALKPHOS 81  BILITOT 1.2  PROT 8.4*  ALBUMIN 4.0   Recent Labs  Lab 09/28/21 2018  LIPASE 25   No results for input(s): AMMONIA in the last 168 hours. Coagulation  Profile: No results for input(s): INR, PROTIME in the last 168 hours. Cardiac Enzymes: No results for input(s): CKTOTAL, CKMB, CKMBINDEX, TROPONINI in the last 168 hours. BNP (last 3 results) No results for input(s): PROBNP in the last 8760 hours. HbA1C: No results for input(s): HGBA1C in the last 72 hours. CBG: No results for input(s): GLUCAP in the last 168 hours. Lipid Profile: No results for input(s): CHOL, HDL, LDLCALC, TRIG, CHOLHDL, LDLDIRECT in the last 72 hours. Thyroid Function Tests: No results for input(s): TSH, T4TOTAL, FREET4, T3FREE, THYROIDAB in the last 72 hours. Anemia Panel: No results for input(s): VITAMINB12, FOLATE, FERRITIN, TIBC, IRON, RETICCTPCT in the last 72 hours. Sepsis Labs: No results for input(s): PROCALCITON, LATICACIDVEN in the last 168 hours.  Recent Results (from the past 240 hour(s))  Resp Panel by RT-PCR (Flu A&B, Covid)     Status: None   Collection Time: 09/29/21  3:52 AM   Specimen: Nasopharyngeal(NP) swabs in vial transport medium  Result Value Ref Range Status   SARS Coronavirus 2 by RT PCR NEGATIVE NEGATIVE Final    Comment: (NOTE) SARS-CoV-2 target nucleic acids are NOT DETECTED.  The SARS-CoV-2 RNA is generally detectable in upper respiratory specimens during the acute phase of infection. The lowest concentration of SARS-CoV-2 viral copies this assay can detect is 138 copies/mL. A negative result does not preclude SARS-Cov-2 infection and should not be used as the sole basis for treatment or other patient management decisions. A negative result may occur with  improper specimen collection/handling, submission of specimen other than nasopharyngeal swab, presence of viral mutation(s) within the areas targeted by this assay, and inadequate number of viral copies(<138 copies/mL). A negative result must be combined with clinical observations, patient history, and epidemiological information. The expected result is Negative.  Fact Sheet  for Patients:  EntrepreneurPulse.com.au  Fact Sheet for Healthcare Providers:  IncredibleEmployment.be  This test is no t yet approved or cleared by the [place] FDA and  has been authorized for detection and/or diagnosis of SARS-CoV-2 by FDA under an Emergency Use Authorization (EUA). This EUA will remain  in effect (meaning this test can be used) for the duration of the COVID-19 declaration under Section 564(b)(1) of the Act, 21 U.S.C.section 360bbb-3(b)(1), unless the authorization is terminated  or revoked sooner.       Influenza A by PCR NEGATIVE NEGATIVE Final   Influenza B by PCR NEGATIVE NEGATIVE Final    Comment: (NOTE) The Xpert Xpress SARS-CoV-2/FLU/RSV plus assay is intended as an aid in the diagnosis of influenza from Nasopharyngeal swab specimens and should not be used as a sole basis for treatment. Nasal washings and aspirates are unacceptable for Xpert Xpress SARS-CoV-2/FLU/RSV testing.  Fact Sheet for Patients: EntrepreneurPulse.com.au  Fact Sheet for Healthcare Providers: IncredibleEmployment.be  This test is not yet approved or cleared by the [place] FDA and has been authorized for detection and/or diagnosis of SARS-CoV-2 by FDA under an Emergency Use Authorization (EUA). This EUA  will remain in effect (meaning this test can be used) for the duration of the COVID-19 declaration under Section 564(b)(1) of the Act, 21 U.S.C. section 360bbb-3(b)(1), unless the authorization is terminated or revoked.  Performed at [facility], [address], [place], [place] [address]          Radiology Studies: CT ABDOMEN PELVIS WO CONTRAST  Result Date: 09/29/2021 CLINICAL DATA:  Left lower quadrant pain. EXAM: CT ABDOMEN AND PELVIS WITHOUT CONTRAST TECHNIQUE: Multidetector CT imaging of the abdomen and pelvis was performed following the standard protocol without IV  contrast. COMPARISON:  None. FINDINGS: Lower chest: A tissue asymmetry is noted in the superior left breast. Reportedly this underwent biopsy on 02/28/2021 with unknown results. Please correlate with the surgical/pathologic record. There is linear atelectasis in the lower lobes without lung base infiltrates. Small hiatal hernia. The cardiac size is normal. No pericardial fluid is seen. There is significant elevation of the right hemidiaphragm, majority of liver intrathoracic, consistent with eventration or diaphragmatic paresis. Hepatobiliary: There is a 1.3 cm cyst of 18 Hounsfield units in the medial aspect of the anterior segment of the right lobe of the liver, a 1.3 cm cyst of 19.7 Hounsfield units in the lateral segment left lobe, a 1.2 cm cyst of 19.5 Hounsfield units more laterally in the left lobe lateral segment, and a few additional left and right lower parenchymal hypodensities which are too small to characterize. No mass is seen without contrast. The gallbladder and bile ducts are unremarkable. Pancreas: No mass or ductal dilatation visible without contrast. Spleen: Unremarkable without contrast. Adrenals/Urinary Tract: There is no adrenal mass. Large 9.7 cm thin walled cyst noted upper pole left kidney of 1.9 Hounsfield units, inferior to this is a left upper pole parapelvic cyst of 4.6 cm and 3.6 Hounsfield units. No other renal cortical abnormality is seen. No urinary stone or hydronephrosis. Normal bladder. Stomach/Bowel: Mild-to-moderate fold thickening in the proximal to mid stomach. The small bowel is normal in caliber except for mildly dilated thickened segments up to 3.3 cm caliber, with mesenteric edema, extending lateral to the ascending colon through a transmesenteric internal hernia of the ascending mesocolon. The wall thickening in the segments could be inflammatory or ischemic but there is no portal venous gas or appreciable wall pneumatosis. A short nonobstructed knuckle of the small  bowel extends into and out of a small umbilical fat hernia. There are diffuse colonic diverticula. Adjacent mesenteric stranding is noted alongside the distal descending colon consistent with acute diverticulitis over short-segment. No diverticular abscess is seen. There is mild-to-moderate constipation transverse and left-sided colon and rectum. Vascular/Lymphatic: Aortic atherosclerosis. No enlarged abdominal or pelvic lymph nodes. 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 Reproductive: The uterus is intact and no adnexal masses seen. There is a dorsal body of the uterus subserosal fibroid measuring 3.3 cm. The endometrium appears thickened warranting ultrasound follow-up but this can be overestimated on CT. There is no free air, hemorrhage or fluid. Other: There is evidence of a left inguinal hernia repair with mesh screws in place. Umbilical hernia mostly contains fat as well as a small bowel loop which is nonobstructed. Right inguinal fat hernia noted. Musculoskeletal: Osteopenia with degenerative changes of the spine. There is multilevel lumbar spinous process abutment and spurring, mild hip DJD. Bilateral sacroiliitis with chronic appearance. IMPRESSION: 1. Diffuse diverticulosis, with short-segment of the distal descending colon with adjacent mesenteric haziness consistent with mild acute diverticulitis. No diverticular abscess or free  air. 2. Dilated and thickened small bowel segments extending lateral to the ascending colon consistent with transmesenteric internal hernia. Findings are concerning for a closed loop small bowel obstruction. No upstream or downstream bowel dilatation is seen. The wall thickening could be inflammatory or ischemic and there is adjacent mesenteric edema but no visible pneumatosis. 3. Left renal cysts. 4. 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 5. Fibroid uterus with suspected thickened endometrium. Ultrasound  follow-up recommended. 6. Osteopenia and degenerative change. 7. Nonobstructive umbilical hernia containing a short knuckle of small bowel. 8. Tissue asymmetry superior left breast, apparently underwent surgical biopsy 02/28/2021 with unknown results. 9. Small hepatic cysts and additional too small to characterize subcentimeter hypodensities. Electronically Signed   By: Telford Nab M.D.   On: 09/29/2021 02:10        Scheduled Meds:  anastrozole  1 mg Oral Daily   atorvastatin  10 mg Oral Daily   cholecalciferol  2,000 Units Oral Daily   dorzolamide-timolol  1 drop Both Eyes BID   enoxaparin (LOVENOX) injection  40 mg Subcutaneous Q24H   losartan  100 mg Oral Daily   And   hydrochlorothiazide  25 mg Oral Daily   insulin aspart  0-9 Units Subcutaneous TID AC & HS   latanoprost  1 drop Left Eye QHS   pantoprazole  40 mg Oral Daily   potassium chloride  40 mEq Oral Once   potassium chloride  40 mEq Oral Once   verapamil  240 mg Oral Daily   Continuous Infusions:  0.9 % NaCl with KCl 20 mEq / L     lactated ringers     piperacillin-tazobactam (ZOSYN)  IV       LOS: 0 days   Time spent: 61min  Kashana Breach C Charidy Cappelletti, DO [facility]  If 7PM-7AM, please contact night-coverage [email or web address]  09/29/2021, 8:31 AM

## 2021-09-29 NOTE — ED Provider Notes (Signed)
[facility] Emergency Department Provider Note  ____________________________________________  Time seen: Approximately 2:54 AM  I have reviewed the triage vital signs and the nursing notes.   HISTORY  Chief Complaint Abdominal Pain   HPI Katie Thompson is a 75 y.o. female with a history of diverticular disease, hypertension, hyperlipidemia, diabetes, appendectomy who presents for evaluation of abdominal pain.  Patient reports the pain started around 2 PM.  She describes the pain as sharp, severe, in her lower abdomen, radiating straight to the back and down to her legs.  The pain was associated with nausea but no vomiting.  Patient reports that since being in triage she has been passing gas and the pain has now subsided.  She does suffer from constipation.  Denies diarrhea, vomiting, fever, dysuria or hematuria.  Denies any prior history of SBO.  Past Medical History:  Diagnosis Date   Arthritis    Breast cancer ([place]) 2014   LT with radiation   Diabetes mellitus without complication ([place])    diet controlled no meds   Diverticulosis feb. 2015   from colonoscopy   GERD (gastroesophageal reflux disease)    History of hiatal hernia    Hyperlipidemia    Hypertension    Personal history of radiation therapy 2014   DCIS left breast    Patient Active Problem List   Diagnosis Date Noted   Cancer ([place]) 06/07/2021   Atypical ductal hyperplasia, breast 06/07/2021   Ductal carcinoma in situ (DCIS) of left breast 05/19/2017   Vitamin D deficiency 82/50/5397   Lichen 67/34/1937   Osteopenia determined by x-ray 08/14/2014   Anemia, unspecified 01/17/2014   Benign essential hypertension 01/17/2014   Diabetes mellitus type 2, controlled ([place]) 01/17/2014   Neck pain 01/17/2014   Pure hypercholesterolemia 01/17/2014    Past Surgical History:  Procedure Laterality Date   APPENDECTOMY     BREAST BIOPSY Left 2016   coil marker, BENIGN BREAST TISSUE WITH  CLUSTERED MICROCYSTS AND STROMAL FIBROSIS.    BREAST BIOPSY Left 01/05/2018   Affirm Bx- x marker, FIBROCYSTIC CHANGE WITH MICROCALCIFICATIONS   BREAST BIOPSY Left 02/04/2013   DCIS   BREAST BIOPSY Left 01/29/2021   Stereo Bx, X-Clip, INTRADUCTAL PAPILLOMA WITH SCLEROSING ADENOSIS    BREAST BIOPSY WITH RADIO FREQUENCY LOCALIZER Left 02/28/2021   Procedure: BREAST BIOPSY WITH RADIO FREQUENCY LOCALIZER;  Surgeon: Herbert Pun, MD;  Location: [facility] ORS;  Service: General;  Laterality: Left;   BREAST EXCISIONAL BIOPSY Left 07/29/2017   SCLEROTIC INTRADUCTAL PAPILLOMA.   BREAST LUMPECTOMY Left 2014   DCIS with clear margins.    BTL     EXCISION OF BREAST LESION Left 07/29/2017   Procedure: EXCISION OF BREAST MASS;  Surgeon: Leonie Green, MD;  Location: [facility] ORS;  Service: General;  Laterality: Left;   HERNIA REPAIR     NASAL SINUS SURGERY      Prior to Admission medications   Medication Sig Start Date End Date Taking? Authorizing Provider  anastrozole (ARIMIDEX) 1 MG tablet TAKE 1 TABLET(1 MG) BY MOUTH DAILY 08/29/21   Earlie Server, MD  aspirin EC 81 MG tablet Take 81 mg by mouth daily.  09/19/14   [provider]  atorvastatin (LIPITOR) 10 MG tablet Take 10 mg by mouth every morning. 09/19/14   [provider]  calcium carbonate (TUMS - DOSED IN MG ELEMENTAL CALCIUM) 500 MG chewable tablet Chew 1 tablet by mouth 2 (two) times daily. Patient not taking: No sig reported  [provider]  Cholecalciferol (VITAMIN D3) 50 MCG (2000 UT) capsule Take 2,000 Units by mouth daily. 05/16/21   [provider]  Cholecalciferol 50 MCG (2000 UT) TABS Take 2,000 Units by mouth daily.    [provider]  Cyanocobalamin (VITAMIN B12 PO) Take 1 tablet by mouth daily.    [provider]  dorzolamide (TRUSOPT) 2 % ophthalmic solution Place 1 drop into both eyes 2 (two) times daily.    [provider]  latanoprost (XALATAN) 0.005 %  ophthalmic solution Place 1 drop into the left eye at bedtime. 12/12/20   [provider]  losartan-hydrochlorothiazide (HYZAAR) 100-12.5 MG tablet Take 1 tablet by mouth every morning. 04/17/16   [provider]  Omeprazole 20 MG TBEC Take 20 mg by mouth every morning.    [provider]  traMADol (ULTRAM) 50 MG tablet Take 1 tablet (50 mg total) by mouth every 6 (six) hours as needed. Patient not taking: No sig reported 02/28/21 02/28/22  Herbert Pun, MD  verapamil (CALAN-SR) 240 MG CR tablet Take 240 mg by mouth every morning. 11/18/14   [provider]    Allergies Codeine and Gatifloxacin  Family History  Problem Relation Age of Onset   Ovarian cancer Mother    Hypertension Mother    Diabetes Paternal Grandmother    Hypertension Paternal Grandmother    Breast cancer Paternal Aunt 53    Social History Social History   Tobacco Use   Smoking status: Never   Smokeless tobacco: Never  Vaping Use   Vaping Use: Never used  Substance Use Topics   Alcohol use: No   Drug use: No    Review of Systems  Constitutional: Negative for fever. Eyes: Negative for visual changes. ENT: Negative for sore throat. Neck: No neck pain  Cardiovascular: Negative for chest pain. Respiratory: Negative for shortness of breath. Gastrointestinal: + lower abdominal pain and nausea. No vomiting or diarrhea. Genitourinary: Negative for dysuria. Musculoskeletal: Negative for back pain. Skin: Negative for rash. Neurological: Negative for headaches, weakness or numbness. Psych: No SI or HI  ____________________________________________   PHYSICAL EXAM:  VITAL SIGNS: ED Triage Vitals  Enc Vitals Group     BP 09/28/21 2013 (!) 144/84     Pulse Rate 09/28/21 2013 67     Resp 09/28/21 2013 17     Temp 09/28/21 2013 98.8 F (37.1 C)     Temp Source 09/28/21 2013 Oral     SpO2 09/28/21 2013 96 %     Weight 09/28/21 2014 181 lb (82.1 kg)     Height 09/28/21  2014 5\' 3"  (1.6 m)     Head Circumference --      Peak Flow --      Pain Score 09/28/21 2013 10     Pain Loc --      Pain Edu? --      Excl. in [place]? --     Constitutional: Alert and oriented. Well appearing and in no apparent distress. HEENT:      Head: Normocephalic and atraumatic.         Eyes: Conjunctivae are normal. Sclera is non-icteric.       Mouth/Throat: Mucous membranes are moist.       Neck: Supple with no signs of meningismus. Cardiovascular: Regular rate and rhythm. No murmurs, gallops, or rubs. 2+ symmetrical distal pulses are present in all extremities. No JVD. Respiratory: Normal respiratory effort. Lungs are clear to auscultation bilaterally.  Gastrointestinal: Soft, non  tender, and non distended with positive bowel sounds. No rebound or guarding. Genitourinary: No CVA tenderness. Musculoskeletal:  No edema, cyanosis, or erythema of extremities. Neurologic: Normal speech and language. Face is symmetric. Moving all extremities. No gross focal neurologic deficits are appreciated. Skin: Skin is warm, dry and intact. No rash noted. Psychiatric: Mood and affect are normal. Speech and behavior are normal.  ____________________________________________   LABS (all labs ordered are listed, but only abnormal results are displayed)  Labs Reviewed  COMPREHENSIVE METABOLIC PANEL - Abnormal; Notable for the following components:      Result Value   Potassium 3.0 (*)    Glucose, Bld 112 (*)    Total Protein 8.4 (*)    All other components within normal limits  CBC - Abnormal; Notable for the following components:   Hemoglobin 11.8 (*)    HCT 35.3 (*)    RDW 15.8 (*)    All other components within normal limits  URINALYSIS, ROUTINE W REFLEX MICROSCOPIC - Abnormal; Notable for the following components:   Color, Urine YELLOW (*)    APPearance CLEAR (*)    Ketones, ur 5 (*)    Protein, ur 30 (*)    All other components within normal limits  RESP PANEL BY RT-PCR (FLU A&B,  COVID) ARPGX2  LIPASE, BLOOD   ____________________________________________  EKG  none  ____________________________________________  RADIOLOGY  I have personally reviewed the images performed during this visit and I agree with the Radiologist's read.   Interpretation by Radiologist:  CT ABDOMEN PELVIS WO CONTRAST  Result Date: 09/29/2021 CLINICAL DATA:  Left lower quadrant pain. EXAM: CT ABDOMEN AND PELVIS WITHOUT CONTRAST TECHNIQUE: Multidetector CT imaging of the abdomen and pelvis was performed following the standard protocol without IV contrast. COMPARISON:  None. FINDINGS: Lower chest: A tissue asymmetry is noted in the superior left breast. Reportedly this underwent biopsy on 02/28/2021 with unknown results. Please correlate with the surgical/pathologic record. There is linear atelectasis in the lower lobes without lung base infiltrates. Small hiatal hernia. The cardiac size is normal. No pericardial fluid is seen. There is significant elevation of the right hemidiaphragm, majority of liver intrathoracic, consistent with eventration or diaphragmatic paresis. Hepatobiliary: There is a 1.3 cm cyst of 18 Hounsfield units in the medial aspect of the anterior segment of the right lobe of the liver, a 1.3 cm cyst of 19.7 Hounsfield units in the lateral segment left lobe, a 1.2 cm cyst of 19.5 Hounsfield units more laterally in the left lobe lateral segment, and a few additional left and right lower parenchymal hypodensities which are too small to characterize. No mass is seen without contrast. The gallbladder and bile ducts are unremarkable. Pancreas: No mass or ductal dilatation visible without contrast. Spleen: Unremarkable without contrast. Adrenals/Urinary Tract: There is no adrenal mass. Large 9.7 cm thin walled cyst noted upper pole left kidney of 1.9 Hounsfield units, inferior to this is a left upper pole parapelvic cyst of 4.6 cm and 3.6 Hounsfield units. No other renal cortical  abnormality is seen. No urinary stone or hydronephrosis. Normal bladder. Stomach/Bowel: Mild-to-moderate fold thickening in the proximal to mid stomach. The small bowel is normal in caliber except for mildly dilated thickened segments up to 3.3 cm caliber, with mesenteric edema, extending lateral to the ascending colon through a transmesenteric internal hernia of the ascending mesocolon. The wall thickening in the segments could be inflammatory or ischemic but there is no portal venous gas or appreciable wall pneumatosis. A short nonobstructed knuckle of  the small bowel extends into and out of a small umbilical fat hernia. There are diffuse colonic diverticula. Adjacent mesenteric stranding is noted alongside the distal descending colon consistent with acute diverticulitis over short-segment. No diverticular abscess is seen. There is mild-to-moderate constipation transverse and left-sided colon and rectum. Vascular/Lymphatic: Aortic atherosclerosis. No enlarged abdominal or pelvic lymph nodes. 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 Reproductive: The uterus is intact and no adnexal masses seen. There is a dorsal body of the uterus subserosal fibroid measuring 3.3 cm. The endometrium appears thickened warranting ultrasound follow-up but this can be overestimated on CT. There is no free air, hemorrhage or fluid. Other: There is evidence of a left inguinal hernia repair with mesh screws in place. Umbilical hernia mostly contains fat as well as a small bowel loop which is nonobstructed. Right inguinal fat hernia noted. Musculoskeletal: Osteopenia with degenerative changes of the spine. There is multilevel lumbar spinous process abutment and spurring, mild hip DJD. Bilateral sacroiliitis with chronic appearance. IMPRESSION: 1. Diffuse diverticulosis, with short-segment of the distal descending colon with adjacent mesenteric haziness consistent with mild acute diverticulitis. No  diverticular abscess or free air. 2. Dilated and thickened small bowel segments extending lateral to the ascending colon consistent with transmesenteric internal hernia. Findings are concerning for a closed loop small bowel obstruction. No upstream or downstream bowel dilatation is seen. The wall thickening could be inflammatory or ischemic and there is adjacent mesenteric edema but no visible pneumatosis. 3. Left renal cysts. 4. 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 5. Fibroid uterus with suspected thickened endometrium. Ultrasound follow-up recommended. 6. Osteopenia and degenerative change. 7. Nonobstructive umbilical hernia containing a short knuckle of small bowel. 8. Tissue asymmetry superior left breast, apparently underwent surgical biopsy 02/28/2021 with unknown results. 9. Small hepatic cysts and additional too small to characterize subcentimeter hypodensities. Electronically Signed   By: Telford Nab M.D.   On: 09/29/2021 02:10     ____________________________________________   PROCEDURES  Procedure(s) performed: None Procedures   Critical Care performed:  None ____________________________________________   INITIAL IMPRESSION / ASSESSMENT AND PLAN / ED COURSE  74 y.o. female with a history of diverticular disease, hypertension, hyperlipidemia, diabetes, appendectomy who presents for evaluation of abdominal pain and nausea.  Patient reports that her symptoms have subsided now since she started passing gas while waiting.  She is otherwise well-appearing in no distress with normal vital signs.  Abdomen soft, nondistended, positive bowel sounds, with no significant tenderness.  Labs showing mild hypokalemia which was supplemented.  No leukocytosis.  Stable mild anemia.  UA negative for UTI or blood.  CT abdomen pelvis concerning for mild acute diverticulitis and closed-loop small bowel obstruction patient is not distended, not actively vomiting, and she  is passing gas therefore we will hold off on an NG tube.  We will start patient on IV hydration, IV Zosyn, and get her admitted to the hospitalist service.      _____________________________________________ Please note:  Patient was evaluated in Emergency Department today for the symptoms described in the history of present illness. Patient was evaluated in the context of the global COVID-19 pandemic, which necessitated consideration that the patient might be at risk for infection with the SARS-CoV-2 virus that causes COVID-19. Institutional protocols and algorithms that pertain to the evaluation of patients at risk for COVID-19 are in a state of rapid change based on information released by regulatory bodies including the CDC and federal and state  organizations. These policies and algorithms were followed during the patient's care in the ED.  Some ED evaluations and interventions may be delayed as a result of limited staffing during the pandemic.   [place] Controlled Substance Database was reviewed by me. ____________________________________________   FINAL CLINICAL IMPRESSION(S) / ED DIAGNOSES   Final diagnoses:  Diverticulitis  SBO (small bowel obstruction) ([place])      NEW MEDICATIONS STARTED DURING THIS VISIT:  ED Discharge Orders     None        Note:  This document was prepared using Dragon voice recognition software and may include unintentional dictation errors.    Rudene Re, MD 09/29/21 [phone]

## 2021-09-29 NOTE — Consult Note (Signed)
[facility] SURGICAL CONSULTATION NOTE    HISTORY OF PRESENT ILLNESS (HPI):  75 y.o. female presented to [facility] ED today for evaluation of lower abdominal pain. Patient reports that her abdominal pain started yesterday at 2 in the afternoon.  It has progressively worsened to the point that she felt she needed to be seen in the hospital.  She describes the pain as a severe sharp pain in her lower pelvis.  The pain does not radiate anywhere. Her pain has since resolved since being in the emergency department.  She is moving her bowels without issue, and she is passing flatus.  She denies nausea and vomiting.  She denies fevers and chills.  She has a surgical history significant for a right inguinal hernia repair, appendectomy in the 1970s, and multiple left breast surgeries.  She takes an 81 mg aspirin daily.  Surgery is consulted by physician Dr. Sidney Ace in this context for evaluation and management diverticulitis and concern for small bowel obstruction.  PAST MEDICAL HISTORY (PMH):  Past Medical History:  Diagnosis Date   Arthritis    Breast cancer ([place]) 2014   LT with radiation   Diabetes mellitus without complication ([place])    diet controlled no meds   Diverticulosis feb. 2015   from colonoscopy   GERD (gastroesophageal reflux disease)    History of hiatal hernia    Hyperlipidemia    Hypertension    Personal history of radiation therapy 2014   DCIS left breast     PAST SURGICAL HISTORY ([place]):  Past Surgical History:  Procedure Laterality Date   APPENDECTOMY     BREAST BIOPSY Left 2016   coil marker, BENIGN BREAST TISSUE WITH CLUSTERED MICROCYSTS AND STROMAL FIBROSIS.    BREAST BIOPSY Left 01/05/2018   Affirm Bx- x marker, FIBROCYSTIC CHANGE WITH MICROCALCIFICATIONS   BREAST BIOPSY Left 02/04/2013   DCIS   BREAST BIOPSY Left 01/29/2021   Stereo Bx, X-Clip, INTRADUCTAL PAPILLOMA WITH SCLEROSING ADENOSIS    BREAST BIOPSY WITH RADIO FREQUENCY LOCALIZER Left 02/28/2021    Procedure: BREAST BIOPSY WITH RADIO FREQUENCY LOCALIZER;  Surgeon: Herbert Pun, MD;  Location: [facility] ORS;  Service: General;  Laterality: Left;   BREAST EXCISIONAL BIOPSY Left 07/29/2017   SCLEROTIC INTRADUCTAL PAPILLOMA.   BREAST LUMPECTOMY Left 2014   DCIS with clear margins.    BTL     EXCISION OF BREAST LESION Left 07/29/2017   Procedure: EXCISION OF BREAST MASS;  Surgeon: Leonie Green, MD;  Location: [facility] ORS;  Service: General;  Laterality: Left;   HERNIA REPAIR     NASAL SINUS SURGERY       MEDICATIONS:  Prior to Admission medications   Medication Sig Start Date End Date Taking? Authorizing Provider  anastrozole (ARIMIDEX) 1 MG tablet TAKE 1 TABLET(1 MG) BY MOUTH DAILY 08/29/21  Yes Earlie Server, MD  aspirin EC 81 MG tablet Take 81 mg by mouth daily.  09/19/14  Yes [provider]  atorvastatin (LIPITOR) 10 MG tablet Take 10 mg by mouth every morning. 09/19/14  Yes [provider]  Cholecalciferol (VITAMIN D3) 50 MCG (2000 UT) capsule Take 2,000 Units by mouth daily. 05/16/21  Yes [provider]  Cyanocobalamin (VITAMIN B12 PO) Take 1 tablet by mouth daily.   Yes [provider]  dorzolamide-timolol (COSOPT) 22.3-6.8 MG/ML ophthalmic solution 1 drop 2 (two) times daily. 08/03/21  Yes [provider]  latanoprost (XALATAN) 0.005 % ophthalmic solution Place 1 drop into the left eye at bedtime. 12/12/20  Yes [provider]  losartan-hydrochlorothiazide (HYZAAR) 100-25 MG tablet Take 1 tablet by mouth daily. 09/12/21  Yes [provider]  Omeprazole 20 MG TBEC Take 20 mg by mouth every morning.   Yes [provider]  verapamil (CALAN-SR) 240 MG CR tablet Take 240 mg by mouth every morning. 11/18/14  Yes [provider]  calcium carbonate (TUMS - DOSED IN MG ELEMENTAL CALCIUM) 500 MG chewable tablet Chew 1 tablet by mouth 2 (two) times daily. Patient not taking: Reported on 06/07/2021    [provider]  traMADol (ULTRAM) 50 MG tablet Take 1 tablet (50 mg total) by mouth every 6 (six) hours as needed. Patient not taking: Reported on 06/07/2021 02/28/21 02/28/22  Herbert Pun, MD     ALLERGIES:  Allergies  Allergen Reactions   Codeine Nausea And Vomiting   Gatifloxacin Hives     SOCIAL HISTORY:  Social History   Socioeconomic History   Marital status: Married    Spouse name: Not on file   Number of children: Not on file   Years of education: Not on file   Highest education level: Not on file  Occupational History   Not on file  Tobacco Use   Smoking status: Never   Smokeless tobacco: Never  Vaping Use   Vaping Use: Never used  Substance and Sexual Activity   Alcohol use: No   Drug use: No   Sexual activity: Not on file  Other Topics Concern   Not on file  Social History Narrative   Not on file   Social Determinants of Health   Financial Resource Strain: Not on file  Food Insecurity: Not on file  Transportation Needs: Not on file  Physical Activity: Not on file  Stress: Not on file  Social Connections: Not on file  Intimate Partner Violence: Not on file     FAMILY HISTORY:  Family History  Problem Relation Age of Onset   Ovarian cancer Mother    Hypertension Mother    Diabetes Paternal Grandmother    Hypertension Paternal Grandmother    Breast cancer Paternal Aunt 52      REVIEW OF SYSTEMS:  Review of Systems  Constitutional:  Negative for chills and fever.  Respiratory:  Negative for shortness of breath and wheezing.   Cardiovascular:  Negative for chest pain and palpitations.  Gastrointestinal:  Positive for abdominal pain. Negative for nausea and vomiting.  Genitourinary:  Negative for dysuria, frequency and urgency.  Musculoskeletal:  Negative for back pain and joint pain.  Skin:  Negative for itching and rash.  Neurological:  Negative for dizziness, weakness and headaches.  Psychiatric/Behavioral:  Negative for depression.     VITAL SIGNS:  Temp:  [98.8 F (37.1 C)] 98.8 F (37.1 C) (12/16 2013) Pulse Rate:  [67-72] 72 (12/17 0014) Resp:  [17] 17 (12/17 0014) BP: (144-158)/(83-84) 158/83 (12/17 0014) SpO2:  [96 %-97 %] 97 % (12/17 0014) Weight:  [82.1 kg] 82.1 kg (12/16 2014)     Height: 5\' 3"  (160 cm) Weight: 82.1 kg BMI (Calculated): 32.07   INTAKE/OUTPUT:  12/16 0701 - 12/17 0700 In: 40 [IV Piggyback:50] Out: -   PHYSICAL EXAM:  Physical Exam Constitutional:      General: She is not in acute distress.    Appearance: She is well-developed. She is not ill-appearing.  HENT:     Head: Normocephalic and atraumatic.  Eyes:     Extraocular Movements: Extraocular movements intact.     Pupils: Pupils are  equal, round, and reactive to light.  Cardiovascular:     Rate and Rhythm: Normal rate and regular rhythm.  Pulmonary:     Effort: Pulmonary effort is normal.     Breath sounds: Normal breath sounds.  Abdominal:     Comments: Soft, nondistended, no percussion tenderness, no tenderness to palpation, no rigidity, guarding, or rebound tenderness; midline well-healed scar and additional scar in right lower quadrant  Skin:    General: Skin is warm and dry.  Neurological:     Mental Status: She is alert.     Labs:  CBC Latest Ref Rng & Units 09/29/2021 09/28/2021 07/19/2021  WBC 4.0 - 10.5 K/uL 9.5 7.4 6.9  Hemoglobin 12.0 - 15.0 g/dL 12.5 11.8(L) 11.2(L)  Hematocrit 36.0 - 46.0 % 37.7 35.3(L) 33.5(L)  Platelets 150 - 400 K/uL 321 330 322   CMP Latest Ref Rng & Units 09/28/2021 07/19/2021 06/07/2021  Glucose 70 - 99 mg/dL 112(H) 107(H) 150(H)  BUN 8 - 23 mg/dL 19 26(H) 22  Creatinine 0.44 - 1.00 mg/dL 0.86 1.03(H) 0.97  Sodium 135 - 145 mmol/L 139 139 137  Potassium 3.5 - 5.1 mmol/L 3.0(L) 3.2(L) 4.3  Chloride 98 - 111 mmol/L 102 105 103  CO2 22 - 32 mmol/L 29 26 28   Calcium 8.9 - 10.3 mg/dL 9.8 9.4 9.5  Total Protein 6.5 - 8.1 g/dL 8.4(H) 7.8 7.8  Total Bilirubin 0.3 - 1.2 mg/dL 1.2 0.5 0.7   Alkaline Phos 38 - 126 U/L 81 89 98  AST 15 - 41 U/L 16 18 19   ALT 0 - 44 U/L 10 11 11     Imaging studies:  CT ABDOMEN AND PELVIS WITHOUT CONTRAST (12/17 1:15AM) IMPRESSION: 1. Diffuse diverticulosis, with short-segment of the distal descending colon with adjacent mesenteric haziness consistent with mild acute diverticulitis. No diverticular abscess or free air. 2. Dilated and thickened small bowel segments extending lateral to the ascending colon consistent with transmesenteric internal hernia. Findings are concerning for a closed loop small bowel obstruction. No upstream or downstream bowel dilatation is seen. The wall thickening could be inflammatory or ischemic and there is adjacent mesenteric edema but no visible pneumatosis. 3. Left renal cysts. 4. 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 5. Fibroid uterus with suspected thickened endometrium. Ultrasound follow-up recommended. 6. Osteopenia and degenerative change. 7. Nonobstructive umbilical hernia containing a short knuckle of small bowel. 8. Tissue asymmetry superior left breast, apparently underwent surgical biopsy 02/28/2021 with unknown results. 9. Small hepatic cysts and additional too small to characterize subcentimeter hypodensities.  CT ABDOMEN AND PELVIS WITH CONTRAST (12/17 8:35AM) IMPRESSION: 1. Segment of mid to distal small bowel shows wall thickening and adjacent mild inflammation consistent with an inflammatory or infectious enteritis. Portions of the involved small bowel segment are mildly dilated, with other portions nondistended, which would argue against a closed loop obstruction. There is also no dilation of small bowel above the involved segment. 2. No current evidence of colonic inflammation/diverticulitis. No other acute abnormality.  Assessment/Plan:  75 y.o. female who presents with abdominal pain and CT demonstrates small bowel enteritis and possible small  segment of acute descending diverticulitis. WBC 9.5, Cr 0.86   -Patient's initial CT scan of abdomen and pelvis overnight was read by radiologist to demonstrate a concern for internal herniation with a potential closed-loop small bowel obstruction.  This was evaluated with a different radiologist, who agreed the area of dilatation was unimpressive without obvious proximal and distal transition points and no signs  of obstruction proximal to the area of concern.  For this reason, a repeat CT abdomen and pelvis with both IV and oral contrast was obtained and this CT scan demonstrates contrast within this dilated portion of small bowel.  The same repeat imaging study is more consistent with a small bowel enteritis  -Patient with benign abdominal exam, no nausea/vomiting, normal lactic acid of 1.3, normal vital signs and no leukocytosis -clinical suspicion for internal herniation with closed-loop bowel obstruction is significantly low, especially after repeat CT imaging  -Patient admitted to the internal medicine team  -Currently receiving IV Zosyn  -CLD ordered for patient  -Continue to monitor abdominal exam closely  -PRN pain control and antiemetics  -Further recommendations per primary team  All of the above findings and recommendations were discussed with the patient and her family, and all of patient's and her family's questions were answered to their expressed satisfaction.  Thank you for the opportunity to participate in this patient's care.   -- Graciella Freer, DO

## 2021-09-29 NOTE — ED Notes (Signed)
Pt up to use bathroom 

## 2021-09-29 NOTE — ED Notes (Signed)
Pt given 8 oz apple juice 

## 2021-09-30 LAB — BASIC METABOLIC PANEL
Anion gap: 6 (ref 5–15)
BUN: 12 mg/dL (ref 8–23)
CO2: 25 mmol/L (ref 22–32)
Calcium: 9.4 mg/dL (ref 8.9–10.3)
Chloride: 104 mmol/L (ref 98–111)
Creatinine, Ser: 0.74 mg/dL (ref 0.44–1.00)
GFR, Estimated: >60 mL/min (ref >60)
Glucose, Bld: 89 mg/dL (ref 70–99)
Potassium: 3.9 mmol/L (ref 3.5–5.1)
Sodium: 135 mmol/L (ref 135–145)

## 2021-09-30 LAB — CBC
HCT: 33.8 % — ABNORMAL LOW (ref 36.0–46.0)
Hemoglobin: 11.2 g/dL — ABNORMAL LOW (ref 12.0–15.0)
MCH: 27.9 pg (ref 26.0–34.0)
MCHC: 33.1 g/dL (ref 30.0–36.0)
MCV: 84.1 fL (ref 80.0–100.0)
Platelets: 284 10*3/uL (ref 150–400)
RBC: 4.02 MIL/uL (ref 3.87–5.11)
RDW: 15.9 % — ABNORMAL HIGH (ref 11.5–15.5)
WBC: 5.9 10*3/uL (ref 4.0–10.5)
nRBC: 0 % (ref 0.0–0.2)

## 2021-09-30 LAB — GLUCOSE, CAPILLARY
Glucose-Capillary: 176 mg/dL — ABNORMAL HIGH (ref 70–99)
Glucose-Capillary: 130 mg/dL — ABNORMAL HIGH (ref 70–99)

## 2021-09-30 NOTE — Plan of Care (Signed)

## 2021-09-30 NOTE — Progress Notes (Signed)
[place] Hospital Day(s): 1.   Interval History: Patient seen and examined, no acute events or new complaints overnight.  Patient states that her abdominal pain has completely resolved.  She tolerated her clear liquid diet without nausea and vomiting.  She is also passing flatus and reports a bowel movement today.  Her blood work is unremarkable.  No leukocytosis today.  Review of Systems:  Constitutional: denies fever, chills  HEENT: denies cough or congestion  Respiratory: denies any shortness of breath  Cardiovascular: denies chest pain or palpitations  Gastrointestinal: denies abdominal pain, N/V Genitourinary: denies burning with urination or urinary frequency Musculoskeletal: denies pain, decreased motor or sensation Integumentary: denies any other rashes or skin discolorations Vital signs in last 24 hours: [min-max] current  Temp:  [98.1 F (36.7 C)-98.5 F (36.9 C)] 98.5 F (36.9 C) (12/18 1508) Pulse Rate:  [58-80] 79 (12/18 1508) Resp:  [16-20] 20 (12/18 1508) BP: (133-183)/(68-85) 133/68 (12/18 1508) SpO2:  [95 %-100 %] 100 % (12/18 1508)     Height: 5\' 3"  (160 cm) Weight: 82.1 kg BMI (Calculated): 32.07   Intake/Output last 2 shifts:  12/17 0701 - 12/18 0700 In: 2261.6 [I.V.:161.6; IV Piggyback:2100] Out: -    Physical Exam:  Constitutional: alert, cooperative and no distress  HENT: normocephalic without obvious abnormality  Respiratory: breathing non-labored at rest  Cardiovascular: regular rate and sinus rhythm  Gastrointestinal: soft, non-tender, and non-distended; no rigidity, guarding, or rebound tenderness Musculoskeletal: no edema or wounds, motor and sensation grossly intact, NT    Labs:  CBC Latest Ref Rng & Units 09/30/2021 09/29/2021 09/28/2021  WBC 4.0 - 10.5 K/uL 5.9 9.5 7.4  Hemoglobin 12.0 - 15.0 g/dL 11.2(L) 12.5 11.8(L)  Hematocrit 36.0 - 46.0 % 33.8(L) 37.7 35.3(L)  Platelets 150 - 400 K/uL 284 321  330   CMP Latest Ref Rng & Units 09/30/2021 09/29/2021 09/28/2021  Glucose 70 - 99 mg/dL 89 82 112(H)  BUN 8 - 23 mg/dL 12 15 19   Creatinine 0.44 - 1.00 mg/dL 0.74 0.72 0.86  Sodium 135 - 145 mmol/L 135 133(L) 139  Potassium 3.5 - 5.1 mmol/L 3.9 2.7(LL) 3.0(L)  Chloride 98 - 111 mmol/L 104 97(L) 102  CO2 22 - 32 mmol/L 25 29 29   Calcium 8.9 - 10.3 mg/dL 9.4 9.4 9.8  Total Protein 6.5 - 8.1 g/dL - - 8.4(H)  Total Bilirubin 0.3 - 1.2 mg/dL - - 1.2  Alkaline Phos 38 - 126 U/L - - 81  AST 15 - 41 U/L - - 16  ALT 0 - 44 U/L - - 10    Imaging studies: No new pertinent imaging studies   Assessment/Plan:  75 y.o. female who presents with abdominal pain and CT demonstrates small bowel enteritis and possible small segment of acute descending diverticulitis. WBC 9.5, Cr 0.86   -Patient tolerated clear liquid diet and is asking for more food; diet advanced to GI soft  -Patient's abdomen remains benign without nausea and vomiting and normal vitals and blood work  -Patient currently receiving IV Zosyn, likely does not need any antibiotics at discharge  -Closely monitor abdominal exam  -If patient able to tolerate GI soft diet, patient stable for discharge from general surgery standpoint.  Given repeat CT scan demonstrating no evidence of diverticulitis and small bowel enteritis, would recommend discharge with either no antibiotics or short course for 5 to 7 days.  All of the above findings and recommendations were discussed with the patient, patient's family,  and the medical team, and all of patient's and family's questions were answered to their expressed satisfaction.  -- Graciella Freer, DO

## 2021-09-30 NOTE — Progress Notes (Signed)
DAY ZERO NOTE    Katie Thompson  EHO:[id number] DOB: 10/17/1945 DOA: 09/29/2021 PCP: Baxter Hire, MD   Brief Narrative:  Katie Thompson is a 75 y.o. African-American female with medical history significant for GERD, hypertension, dyslipidemia and diverticulosis as well as type 2 diabetes mellitus, who presented to the ER with acute onset of lower abdominal pain that started yesterday with radiation to her back - imaging and evaluation at intake concerning for acute diverticulitis with possible SBO. Gen Surgery consulted to follow along.  Assessment & Plan:   Acute distal descending colon diverticulitis with associated diffuse diverticulosis. Small bowel obstruction unlikely  -General surgery following, appreciate insight and recommendations -Repeat CT abdomen w/ contrast more consistent with enteritis over obstructive process -Patient tolerating clears well requesting advancement in diet, defer to surgery -NG tube not currently necessary given well-controlled symptoms -Continue antibiotics with Zosyn to cover abdomen  Hypokalemia. - Potassium will be replaced and magnesium level will be checked.  Dyslipidemia. - continue statin therapy.  History of breast cancer. - continue Arimidex.  Essential hypertension. - continue Calan SR and Hyzaar.  Glaucoma. - continue her ophthalmic gtt.'s.  GERD. - continue PPI therapy.  Type 2 diabetes mellitus, non-insulin-dependent -Continue sliding scale insulin, hypoglycemic protocol  DVT prophylaxis: Lovenox. Code Status: full code. Family Communication:  The plan of care was discussed in details with the patient (and family).  Status is: Inpatient  Dispo: The patient is from: Home              Anticipated d/c is to: Home              Anticipated d/c date is: 48 to 72 hours              Patient currently not medically stable for discharge  Consultants:  General surgery  Procedures:  None  Antimicrobials:   Zosyn  Subjective: No acute issues or events overnight, diet being advanced per surgery, pending clinical course patient requesting discharge in the next 24 to 48 hours which is certainly reasonable.  Denies nausea vomiting diarrhea constipation headache fevers chills chest pain shortness of breath  Objective: Vitals:   09/29/21 1730 09/29/21 2040 09/29/21 2120 09/30/21 0450  BP: (!) 160/85 (!) 156/79 (!) 183/79 (!) 142/84  Pulse: 78 74 (!) 58 70  Resp: 18 16 20 20   Temp:  98.5 F (36.9 C) 98.2 F (36.8 C) 98.1 F (36.7 C)  TempSrc:  Oral    SpO2: 96% 95% 99% 100%  Weight:      Height:        Intake/Output Summary (Last 24 hours) at 09/30/2021 0724 Last data filed at 09/30/2021 0317 Gross per 24 hour  Intake 2261.56 ml  Output --  Net 2261.56 ml    Filed Weights   09/28/21 2014  Weight: 82.1 kg    Examination:  General:  Pleasantly resting in bed, No acute distress. HEENT:  Normocephalic atraumatic.  Sclerae nonicteric, noninjected.  Extraocular movements intact bilaterally. Neck:  Without mass or deformity.  Trachea is midline. Lungs:  Clear to auscultate bilaterally without rhonchi, wheeze, or rales. Heart:  Regular rate and rhythm.  Without murmurs, rubs, or gallops. Abdomen:  Soft, nontender, nondistended.  Without guarding or rebound. Extremities: Without cyanosis, clubbing, edema, or obvious deformity. Vascular:  Dorsalis pedis and posterior tibial pulses palpable bilaterally. Skin:  Warm and dry, no erythema, no ulcerations.  Data Reviewed: I have personally reviewed following labs and imaging studies  CBC: Recent  Labs  Lab 09/28/21 2018 09/29/21 0953 09/30/21 0407  WBC 7.4 9.5 5.9  HGB 11.8* 12.5 11.2*  HCT 35.3* 37.7 33.8*  MCV 85.1 86.1 84.1  PLT 330 321 694    Basic Metabolic Panel: Recent Labs  Lab 09/28/21 2018 09/29/21 0953 09/30/21 0407  NA 139 133* 135  K 3.0* 2.7* 3.9  CL 102 97* 104  CO2 29 29 25   GLUCOSE 112* 82 89  BUN 19  15 12   CREATININE 0.86 0.72 0.74  CALCIUM 9.8 9.4 9.4  MG  --  1.9  --     GFR: Estimated Creatinine Clearance: 59.8 mL/min (by C-G formula based on SCr of 0.74 mg/dL). Liver Function Tests: Recent Labs  Lab 09/28/21 2018  AST 16  ALT 10  ALKPHOS 81  BILITOT 1.2  PROT 8.4*  ALBUMIN 4.0    Recent Labs  Lab 09/28/21 2018  LIPASE 25    No results for input(s): AMMONIA in the last 168 hours. Coagulation Profile: No results for input(s): INR, PROTIME in the last 168 hours. Cardiac Enzymes: No results for input(s): CKTOTAL, CKMB, CKMBINDEX, TROPONINI in the last 168 hours. BNP (last 3 results) No results for input(s): PROBNP in the last 8760 hours. HbA1C: No results for input(s): HGBA1C in the last 72 hours. CBG: Recent Labs  Lab 09/29/21 1152 09/29/21 1647 09/29/21 2141  GLUCAP 65* 78 75   Lipid Profile: No results for input(s): CHOL, HDL, LDLCALC, TRIG, CHOLHDL, LDLDIRECT in the last 72 hours. Thyroid Function Tests: No results for input(s): TSH, T4TOTAL, FREET4, T3FREE, THYROIDAB in the last 72 hours. Anemia Panel: No results for input(s): VITAMINB12, FOLATE, FERRITIN, TIBC, IRON, RETICCTPCT in the last 72 hours. Sepsis Labs: Recent Labs  Lab 09/29/21 0953  LATICACIDVEN 1.3    Recent Results (from the past 240 hour(s))  Resp Panel by RT-PCR (Flu A&B, Covid)     Status: None   Collection Time: 09/29/21  3:52 AM   Specimen: Nasopharyngeal(NP) swabs in vial transport medium  Result Value Ref Range Status   SARS Coronavirus 2 by RT PCR NEGATIVE NEGATIVE Final    Comment: (NOTE) SARS-CoV-2 target nucleic acids are NOT DETECTED.  The SARS-CoV-2 RNA is generally detectable in upper respiratory specimens during the acute phase of infection. The lowest concentration of SARS-CoV-2 viral copies this assay can detect is 138 copies/mL. A negative result does not preclude SARS-Cov-2 infection and should not be used as the sole basis for treatment or other patient  management decisions. A negative result may occur with  improper specimen collection/handling, submission of specimen other than nasopharyngeal swab, presence of viral mutation(s) within the areas targeted by this assay, and inadequate number of viral copies(<138 copies/mL). A negative result must be combined with clinical observations, patient history, and epidemiological information. The expected result is Negative.  Fact Sheet for Patients:  EntrepreneurPulse.com.au  Fact Sheet for Healthcare Providers:  IncredibleEmployment.be  This test is no t yet approved or cleared by the [place] FDA and  has been authorized for detection and/or diagnosis of SARS-CoV-2 by FDA under an Emergency Use Authorization (EUA). This EUA will remain  in effect (meaning this test can be used) for the duration of the COVID-19 declaration under Section 564(b)(1) of the Act, 21 U.S.C.section 360bbb-3(b)(1), unless the authorization is terminated  or revoked sooner.       Influenza A by PCR NEGATIVE NEGATIVE Final   Influenza B by PCR NEGATIVE NEGATIVE Final    Comment: (NOTE) The Xpert Xpress  SARS-CoV-2/FLU/RSV plus assay is intended as an aid in the diagnosis of influenza from Nasopharyngeal swab specimens and should not be used as a sole basis for treatment. Nasal washings and aspirates are unacceptable for Xpert Xpress SARS-CoV-2/FLU/RSV testing.  Fact Sheet for Patients: EntrepreneurPulse.com.au  Fact Sheet for Healthcare Providers: IncredibleEmployment.be  This test is not yet approved or cleared by the [place] FDA and has been authorized for detection and/or diagnosis of SARS-CoV-2 by FDA under an Emergency Use Authorization (EUA). This EUA will remain in effect (meaning this test can be used) for the duration of the COVID-19 declaration under Section 564(b)(1) of the Act, 21 U.S.C. section 360bbb-3(b)(1),  unless the authorization is terminated or revoked.  Performed at [facility], [place], [address] [place], [place] [address]           Radiology Studies: CT ABDOMEN PELVIS WO CONTRAST  Result Date: 09/29/2021 CLINICAL DATA:  Left lower quadrant pain. EXAM: CT ABDOMEN AND PELVIS WITHOUT CONTRAST TECHNIQUE: Multidetector CT imaging of the abdomen and pelvis was performed following the standard protocol without IV contrast. COMPARISON:  None. FINDINGS: Lower chest: A tissue asymmetry is noted in the superior left breast. Reportedly this underwent biopsy on 02/28/2021 with unknown results. Please correlate with the surgical/pathologic record. There is linear atelectasis in the lower lobes without lung base infiltrates. Small hiatal hernia. The cardiac size is normal. No pericardial fluid is seen. There is significant elevation of the right hemidiaphragm, majority of liver intrathoracic, consistent with eventration or diaphragmatic paresis. Hepatobiliary: There is a 1.3 cm cyst of 18 Hounsfield units in the medial aspect of the anterior segment of the right lobe of the liver, a 1.3 cm cyst of 19.7 Hounsfield units in the lateral segment left lobe, a 1.2 cm cyst of 19.5 Hounsfield units more laterally in the left lobe lateral segment, and a few additional left and right lower parenchymal hypodensities which are too small to characterize. No mass is seen without contrast. The gallbladder and bile ducts are unremarkable. Pancreas: No mass or ductal dilatation visible without contrast. Spleen: Unremarkable without contrast. Adrenals/Urinary Tract: There is no adrenal mass. Large 9.7 cm thin walled cyst noted upper pole left kidney of 1.9 Hounsfield units, inferior to this is a left upper pole parapelvic cyst of 4.6 cm and 3.6 Hounsfield units. No other renal cortical abnormality is seen. No urinary stone or hydronephrosis. Normal bladder. Stomach/Bowel: Mild-to-moderate fold thickening in the proximal  to mid stomach. The small bowel is normal in caliber except for mildly dilated thickened segments up to 3.3 cm caliber, with mesenteric edema, extending lateral to the ascending colon through a transmesenteric internal hernia of the ascending mesocolon. The wall thickening in the segments could be inflammatory or ischemic but there is no portal venous gas or appreciable wall pneumatosis. A short nonobstructed knuckle of the small bowel extends into and out of a small umbilical fat hernia. There are diffuse colonic diverticula. Adjacent mesenteric stranding is noted alongside the distal descending colon consistent with acute diverticulitis over short-segment. No diverticular abscess is seen. There is mild-to-moderate constipation transverse and left-sided colon and rectum. Vascular/Lymphatic: Aortic atherosclerosis. No enlarged abdominal or pelvic lymph nodes. 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 Reproductive: The uterus is intact and no adnexal masses seen. There is a dorsal body of the uterus subserosal fibroid measuring 3.3 cm. The endometrium appears thickened warranting ultrasound follow-up but this can be overestimated on CT. There is no free air, hemorrhage  or fluid. Other: There is evidence of a left inguinal hernia repair with mesh screws in place. Umbilical hernia mostly contains fat as well as a small bowel loop which is nonobstructed. Right inguinal fat hernia noted. Musculoskeletal: Osteopenia with degenerative changes of the spine. There is multilevel lumbar spinous process abutment and spurring, mild hip DJD. Bilateral sacroiliitis with chronic appearance. IMPRESSION: 1. Diffuse diverticulosis, with short-segment of the distal descending colon with adjacent mesenteric haziness consistent with mild acute diverticulitis. No diverticular abscess or free air. 2. Dilated and thickened small bowel segments extending lateral to the ascending colon consistent with  transmesenteric internal hernia. Findings are concerning for a closed loop small bowel obstruction. No upstream or downstream bowel dilatation is seen. The wall thickening could be inflammatory or ischemic and there is adjacent mesenteric edema but no visible pneumatosis. 3. Left renal cysts. 4. 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 5. Fibroid uterus with suspected thickened endometrium. Ultrasound follow-up recommended. 6. Osteopenia and degenerative change. 7. Nonobstructive umbilical hernia containing a short knuckle of small bowel. 8. Tissue asymmetry superior left breast, apparently underwent surgical biopsy 02/28/2021 with unknown results. 9. Small hepatic cysts and additional too small to characterize subcentimeter hypodensities. Electronically Signed   By: Telford Nab M.D.   On: 09/29/2021 02:10   CT ABDOMEN PELVIS W CONTRAST  Result Date: 09/29/2021 CLINICAL DATA:  Left lower quadrant abdominal pain. EXAM: CT ABDOMEN AND PELVIS WITH CONTRAST TECHNIQUE: Multidetector CT imaging of the abdomen and pelvis was performed using the standard protocol following bolus administration of intravenous contrast. CONTRAST:  162mL OMNIPAQUE IOHEXOL 300 MG/ML  SOLN COMPARISON:  09/29/2021 at 1:27 a.m. FINDINGS: Lower chest: Lung base linear opacities consistent with subsegmental atelectasis. No acute findings. Hepatobiliary: Liver normal in size. To low-density liver lesions consistent with cysts. No other masses or lesions. Normal gallbladder. No bile duct dilation. Pancreas: Unremarkable. No pancreatic ductal dilatation or surrounding inflammatory changes. Spleen: Normal in size without focal abnormality. Adrenals/Urinary Tract: No adrenal masses. Large left renal cyst. Mild dilation of the right intrarenal collecting system, without other findings suggesting obstruction. Both ureters normal in course and in caliber. Symmetric renal enhancement and excretion. 1.3 cm  peripherally calcified right renal artery aneurysm. Stomach/Bowel: Normal stomach. Segment mid to distal small bowel shows wall thickening and adjacent hazy mesenteric inflammation. Small bowel proximal and distal to this is normal in caliber with no other areas of wall thickening or inflammation. Colon is normal in caliber. Multiple colonic diverticula. No colonic wall thickening or evidence of diverticulitis. Vascular/Lymphatic: Aortic atherosclerosis. No enlarged lymph nodes. Reproductive: Stable uterine fibroids.  No adnexal masses. Other: Previous left inguinal herniorrhaphy.  No ascites. Musculoskeletal: No fracture or acute finding.  No bone lesion. IMPRESSION: 1. Segment of mid to distal small bowel shows wall thickening and adjacent mild inflammation consistent with an inflammatory or infectious enteritis. Portions of the involved small bowel segment are mildly dilated, with other portions nondistended, which would argue against a closed loop obstruction. There is also no dilation of small bowel above the involved segment. 2. No current evidence of colonic inflammation/diverticulitis. No other acute abnormality. Electronically Signed   By: Lajean Manes M.D.   On: 09/29/2021 09:50        Scheduled Meds:  anastrozole  1 mg Oral Daily   atorvastatin  10 mg Oral Daily   cholecalciferol  2,000 Units Oral Daily   dorzolamide-timolol  1 drop Both Eyes BID   enoxaparin (LOVENOX) injection  40  mg Subcutaneous Q24H   losartan  100 mg Oral Daily   And   hydrochlorothiazide  25 mg Oral Daily   insulin aspart  0-9 Units Subcutaneous TID AC & HS   latanoprost  1 drop Left Eye QHS   pantoprazole  40 mg Oral Daily   verapamil  240 mg Oral Daily   Continuous Infusions:  0.9 % NaCl with KCl 20 mEq / L Stopped (09/30/21 0316)   piperacillin-tazobactam (ZOSYN)  IV 3.375 g (09/30/21 0540)     LOS: 1 day   Time spent: 49min  Dereonna Lensing C Deryn Massengale, DO [facility]  If 7PM-7AM, please contact  night-coverage [email or web address]  09/30/2021, 7:24 AM

## 2021-10-01 LAB — CBC
HCT: 31.6 % — ABNORMAL LOW (ref 36.0–46.0)
Hemoglobin: 10.5 g/dL — ABNORMAL LOW (ref 12.0–15.0)
MCH: 28.5 pg (ref 26.0–34.0)
MCHC: 33.2 g/dL (ref 30.0–36.0)
MCV: 85.6 fL (ref 80.0–100.0)
Platelets: 271 10*3/uL (ref 150–400)
RBC: 3.69 MIL/uL — ABNORMAL LOW (ref 3.87–5.11)
RDW: 15.8 % — ABNORMAL HIGH (ref 11.5–15.5)
WBC: 7.3 10*3/uL (ref 4.0–10.5)
nRBC: 0 % (ref 0.0–0.2)

## 2021-10-01 LAB — HEMOGLOBIN A1C
Hgb A1c MFr Bld: 6.4 % — ABNORMAL HIGH (ref 4.8–5.6)
Mean Plasma Glucose: 137 mg/dL

## 2021-10-01 LAB — GLUCOSE, CAPILLARY: Glucose-Capillary: 123 mg/dL — ABNORMAL HIGH (ref 70–99)

## 2021-10-01 MED ORDER — AMOXICILLIN-POT CLAVULANATE 875-125 MG PO TABS: 1.0000 | ORAL_TABLET | Freq: Two times a day (BID) | ORAL | 0 refills | Status: AC

## 2021-10-01 NOTE — Plan of Care (Signed)

## 2021-10-01 NOTE — Discharge Summary (Signed)
Physician Discharge Summary  SHAUNIE BOEHM ZDG:[id number] DOB: 1946/05/17 DOA: 09/29/2021  PCP: Baxter Hire, MD  Admit date: 09/29/2021 Discharge date: 10/01/2021  Admitted From: Home Disposition: Home  Recommendations for Outpatient Follow-up:  Follow up with PCP in 1-2 weeks Please obtain BMP/CBC in one week Please follow up with general surgery as necessary:  Home Health: None Equipment/Devices: None  Discharge Condition: Stable CODE STATUS: Full Diet recommendation: Soft diet advance to regular diet as tolerated  Brief/Interim Summary: DEZIYAH Thompson is a 75 y.o. African-American female with medical history significant for GERD, hypertension, dyslipidemia and diverticulosis as well as type 2 diabetes mellitus, who presented to the ER with acute onset of lower abdominal pain that started yesterday with radiation to her back - imaging and evaluation at intake concerning for acute diverticulitis with possible SBO. Gen Surgery consulted to follow along.   Assessment & Plan:   Acute distal descending colon diverticulitis with associated diffuse diverticulosis. Small bowel obstruction unlikely  -General surgery following, appreciate insight and recommendations -Repeat CT abdomen w/ contrast more consistent with enteritis over obstructive process -Patient tolerating p.o. quite well advancing diet per surgical recommendations, given improving abdominal pain and discomfort with antibiotics and symptom control otherwise stable for discharge -Continue Augmentin for additional 5 days to cover the abdomen, discussed if her abdomen pain worsened she had worsening distention nausea vomiting fevers or chills she should report back to the ED immediately   Hypokalemia. -Repeat labs with PCP, within normal limits at discharge  Dyslipidemia. - continue statin therapy.   History of breast cancer. - continue Arimidex.   Essential hypertension. - continue Calan SR and  Hyzaar.  Glaucoma. - continue her ophthalmic gtt.'s.  GERD. - continue PPI therapy.  Type 2 diabetes mellitus, non-insulin-dependent Resume home diabetic diet, currently not on medications appears well controlled  Discharge Instructions   Allergies as of 10/01/2021       Reactions   Codeine Nausea And Vomiting   Gatifloxacin Hives        Medication List     STOP taking these medications    calcium carbonate 500 MG chewable tablet Commonly known as: TUMS - dosed in mg elemental calcium   traMADol 50 MG tablet Commonly known as: Ultram       TAKE these medications    amoxicillin-clavulanate 875-125 MG tablet Commonly known as: Augmentin Take 1 tablet by mouth 2 (two) times daily for 5 days.   anastrozole 1 MG tablet Commonly known as: ARIMIDEX TAKE 1 TABLET(1 MG) BY MOUTH DAILY   aspirin EC 81 MG tablet Take 81 mg by mouth daily.   atorvastatin 10 MG tablet Commonly known as: LIPITOR Take 10 mg by mouth every morning.   dorzolamide-timolol 22.3-6.8 MG/ML ophthalmic solution Commonly known as: COSOPT 1 drop 2 (two) times daily.   latanoprost 0.005 % ophthalmic solution Commonly known as: XALATAN Place 1 drop into the left eye at bedtime.   losartan-hydrochlorothiazide 100-25 MG tablet Commonly known as: HYZAAR Take 1 tablet by mouth daily.   Omeprazole 20 MG Tbec Take 20 mg by mouth every morning.   verapamil 240 MG CR tablet Commonly known as: CALAN-SR Take 240 mg by mouth every morning.   VITAMIN B12 PO Take 1 tablet by mouth daily.   Vitamin D3 50 MCG (2000 UT) capsule Take 2,000 Units by mouth daily.        Allergies  Allergen Reactions   Codeine Nausea And Vomiting   Gatifloxacin Hives  Consultations: General surgery  Procedures/Studies: CT ABDOMEN PELVIS WO CONTRAST  Result Date: 09/29/2021 CLINICAL DATA:  Left lower quadrant pain. EXAM: CT ABDOMEN AND PELVIS WITHOUT CONTRAST TECHNIQUE: Multidetector CT imaging of  the abdomen and pelvis was performed following the standard protocol without IV contrast. COMPARISON:  None. FINDINGS: Lower chest: A tissue asymmetry is noted in the superior left breast. Reportedly this underwent biopsy on 02/28/2021 with unknown results. Please correlate with the surgical/pathologic record. There is linear atelectasis in the lower lobes without lung base infiltrates. Small hiatal hernia. The cardiac size is normal. No pericardial fluid is seen. There is significant elevation of the right hemidiaphragm, majority of liver intrathoracic, consistent with eventration or diaphragmatic paresis. Hepatobiliary: There is a 1.3 cm cyst of 18 Hounsfield units in the medial aspect of the anterior segment of the right lobe of the liver, a 1.3 cm cyst of 19.7 Hounsfield units in the lateral segment left lobe, a 1.2 cm cyst of 19.5 Hounsfield units more laterally in the left lobe lateral segment, and a few additional left and right lower parenchymal hypodensities which are too small to characterize. No mass is seen without contrast. The gallbladder and bile ducts are unremarkable. Pancreas: No mass or ductal dilatation visible without contrast. Spleen: Unremarkable without contrast. Adrenals/Urinary Tract: There is no adrenal mass. Large 9.7 cm thin walled cyst noted upper pole left kidney of 1.9 Hounsfield units, inferior to this is a left upper pole parapelvic cyst of 4.6 cm and 3.6 Hounsfield units. No other renal cortical abnormality is seen. No urinary stone or hydronephrosis. Normal bladder. Stomach/Bowel: Mild-to-moderate fold thickening in the proximal to mid stomach. The small bowel is normal in caliber except for mildly dilated thickened segments up to 3.3 cm caliber, with mesenteric edema, extending lateral to the ascending colon through a transmesenteric internal hernia of the ascending mesocolon. The wall thickening in the segments could be inflammatory or ischemic but there is no portal venous gas  or appreciable wall pneumatosis. A short nonobstructed knuckle of the small bowel extends into and out of a small umbilical fat hernia. There are diffuse colonic diverticula. Adjacent mesenteric stranding is noted alongside the distal descending colon consistent with acute diverticulitis over short-segment. No diverticular abscess is seen. There is mild-to-moderate constipation transverse and left-sided colon and rectum. Vascular/Lymphatic: Aortic atherosclerosis. No enlarged abdominal or pelvic lymph nodes. 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 Reproductive: The uterus is intact and no adnexal masses seen. There is a dorsal body of the uterus subserosal fibroid measuring 3.3 cm. The endometrium appears thickened warranting ultrasound follow-up but this can be overestimated on CT. There is no free air, hemorrhage or fluid. Other: There is evidence of a left inguinal hernia repair with mesh screws in place. Umbilical hernia mostly contains fat as well as a small bowel loop which is nonobstructed. Right inguinal fat hernia noted. Musculoskeletal: Osteopenia with degenerative changes of the spine. There is multilevel lumbar spinous process abutment and spurring, mild hip DJD. Bilateral sacroiliitis with chronic appearance. IMPRESSION: 1. Diffuse diverticulosis, with short-segment of the distal descending colon with adjacent mesenteric haziness consistent with mild acute diverticulitis. No diverticular abscess or free air. 2. Dilated and thickened small bowel segments extending lateral to the ascending colon consistent with transmesenteric internal hernia. Findings are concerning for a closed loop small bowel obstruction. No upstream or downstream bowel dilatation is seen. The wall thickening could be inflammatory or ischemic and there is adjacent mesenteric edema but no  visible pneumatosis. 3. Left renal cysts. 4. 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 5. Fibroid uterus with suspected thickened endometrium. Ultrasound follow-up recommended. 6. Osteopenia and degenerative change. 7. Nonobstructive umbilical hernia containing a short knuckle of small bowel. 8. Tissue asymmetry superior left breast, apparently underwent surgical biopsy 02/28/2021 with unknown results. 9. Small hepatic cysts and additional too small to characterize subcentimeter hypodensities. Electronically Signed   By: Telford Nab M.D.   On: 09/29/2021 02:10   CT ABDOMEN PELVIS W CONTRAST  Result Date: 09/29/2021 CLINICAL DATA:  Left lower quadrant abdominal pain. EXAM: CT ABDOMEN AND PELVIS WITH CONTRAST TECHNIQUE: Multidetector CT imaging of the abdomen and pelvis was performed using the standard protocol following bolus administration of intravenous contrast. CONTRAST:  158mL OMNIPAQUE IOHEXOL 300 MG/ML  SOLN COMPARISON:  09/29/2021 at 1:27 a.m. FINDINGS: Lower chest: Lung base linear opacities consistent with subsegmental atelectasis. No acute findings. Hepatobiliary: Liver normal in size. To low-density liver lesions consistent with cysts. No other masses or lesions. Normal gallbladder. No bile duct dilation. Pancreas: Unremarkable. No pancreatic ductal dilatation or surrounding inflammatory changes. Spleen: Normal in size without focal abnormality. Adrenals/Urinary Tract: No adrenal masses. Large left renal cyst. Mild dilation of the right intrarenal collecting system, without other findings suggesting obstruction. Both ureters normal in course and in caliber. Symmetric renal enhancement and excretion. 1.3 cm peripherally calcified right renal artery aneurysm. Stomach/Bowel: Normal stomach. Segment mid to distal small bowel shows wall thickening and adjacent hazy mesenteric inflammation. Small bowel proximal and distal to this is normal in caliber with no other areas of wall thickening or inflammation. Colon is normal in caliber. Multiple colonic diverticula. No colonic wall  thickening or evidence of diverticulitis. Vascular/Lymphatic: Aortic atherosclerosis. No enlarged lymph nodes. Reproductive: Stable uterine fibroids.  No adnexal masses. Other: Previous left inguinal herniorrhaphy.  No ascites. Musculoskeletal: No fracture or acute finding.  No bone lesion. IMPRESSION: 1. Segment of mid to distal small bowel shows wall thickening and adjacent mild inflammation consistent with an inflammatory or infectious enteritis. Portions of the involved small bowel segment are mildly dilated, with other portions nondistended, which would argue against a closed loop obstruction. There is also no dilation of small bowel above the involved segment. 2. No current evidence of colonic inflammation/diverticulitis. No other acute abnormality. Electronically Signed   By: Lajean Manes M.D.   On: 09/29/2021 09:50     Subjective: No acute issues or events overnight denies nausea vomiting diarrhea constipation headache fevers chills or chest pain   Discharge Exam: Vitals:   10/01/21 0526 10/01/21 0838  BP: 135/63 (!) 158/75  Pulse: (!) 55 65  Resp: 18 17  Temp: 98.1 F (36.7 C) 98.4 F (36.9 C)  SpO2: 100% 95%   Vitals:   09/30/21 1508 09/30/21 2017 10/01/21 0526 10/01/21 0838  BP: 133/68 (!) 111/59 135/63 (!) 158/75  Pulse: 79 63 (!) 55 65  Resp: 20 18 18 17   Temp: 98.5 F (36.9 C) 98.3 F (36.8 C) 98.1 F (36.7 C) 98.4 F (36.9 C)  TempSrc:  Oral Oral   SpO2: 100% 100% 100% 95%  Weight:      Height:        General: Pt is alert, awake, not in acute distress Cardiovascular: RRR, S1/S2 +, no rubs, no gallops Respiratory: CTA bilaterally, no wheezing, no rhonchi Abdominal: Soft, NT, ND, bowel sounds + Extremities: no edema, no cyanosis    The results of significant diagnostics from this hospitalization (including  imaging, microbiology, ancillary and laboratory) are listed below for reference.     Microbiology: Recent Results (from the past 240 hour(s))  Resp Panel  by RT-PCR (Flu A&B, Covid)     Status: None   Collection Time: 09/29/21  3:52 AM   Specimen: Nasopharyngeal(NP) swabs in vial transport medium  Result Value Ref Range Status   SARS Coronavirus 2 by RT PCR NEGATIVE NEGATIVE Final    Comment: (NOTE) SARS-CoV-2 target nucleic acids are NOT DETECTED.  The SARS-CoV-2 RNA is generally detectable in upper respiratory specimens during the acute phase of infection. The lowest concentration of SARS-CoV-2 viral copies this assay can detect is 138 copies/mL. A negative result does not preclude SARS-Cov-2 infection and should not be used as the sole basis for treatment or other patient management decisions. A negative result may occur with  improper specimen collection/handling, submission of specimen other than nasopharyngeal swab, presence of viral mutation(s) within the areas targeted by this assay, and inadequate number of viral copies(<138 copies/mL). A negative result must be combined with clinical observations, patient history, and epidemiological information. The expected result is Negative.  Fact Sheet for Patients:  EntrepreneurPulse.com.au  Fact Sheet for Healthcare Providers:  IncredibleEmployment.be  This test is no t yet approved or cleared by the [place] FDA and  has been authorized for detection and/or diagnosis of SARS-CoV-2 by FDA under an Emergency Use Authorization (EUA). This EUA will remain  in effect (meaning this test can be used) for the duration of the COVID-19 declaration under Section 564(b)(1) of the Act, 21 U.S.C.section 360bbb-3(b)(1), unless the authorization is terminated  or revoked sooner.       Influenza A by PCR NEGATIVE NEGATIVE Final   Influenza B by PCR NEGATIVE NEGATIVE Final    Comment: (NOTE) The Xpert Xpress SARS-CoV-2/FLU/RSV plus assay is intended as an aid in the diagnosis of influenza from Nasopharyngeal swab specimens and should not be used as a  sole basis for treatment. Nasal washings and aspirates are unacceptable for Xpert Xpress SARS-CoV-2/FLU/RSV testing.  Fact Sheet for Patients: EntrepreneurPulse.com.au  Fact Sheet for Healthcare Providers: IncredibleEmployment.be  This test is not yet approved or cleared by the [place] FDA and has been authorized for detection and/or diagnosis of SARS-CoV-2 by FDA under an Emergency Use Authorization (EUA). This EUA will remain in effect (meaning this test can be used) for the duration of the COVID-19 declaration under Section 564(b)(1) of the Act, 21 U.S.C. section 360bbb-3(b)(1), unless the authorization is terminated or revoked.  Performed at [facility], [address] [place], [place] [address]      Labs: BNP (last 3 results) No results for input(s): BNP in the last 8760 hours. Basic Metabolic Panel: Recent Labs  Lab 09/28/21 2018 09/29/21 0953 09/30/21 0407  NA 139 133* 135  K 3.0* 2.7* 3.9  CL 102 97* 104  CO2 29 29 25   GLUCOSE 112* 82 89  BUN 19 15 12   CREATININE 0.86 0.72 0.74  CALCIUM 9.8 9.4 9.4  MG  --  1.9  --    Liver Function Tests: Recent Labs  Lab 09/28/21 2018  AST 16  ALT 10  ALKPHOS 81  BILITOT 1.2  PROT 8.4*  ALBUMIN 4.0   Recent Labs  Lab 09/28/21 2018  LIPASE 25   No results for input(s): AMMONIA in the last 168 hours. CBC: Recent Labs  Lab 09/28/21 2018 09/29/21 0953 09/30/21 0407 10/01/21 0400  WBC 7.4 9.5 5.9 7.3  HGB 11.8* 12.5  11.2* 10.5*  HCT 35.3* 37.7 33.8* 31.6*  MCV 85.1 86.1 84.1 85.6  PLT 330 321 284 271   Cardiac Enzymes: No results for input(s): CKTOTAL, CKMB, CKMBINDEX, TROPONINI in the last 168 hours. BNP: Invalid input(s): POCBNP CBG: Recent Labs  Lab 09/29/21 1647 09/29/21 2141 09/30/21 1110 09/30/21 1509 10/01/21 0840  GLUCAP 78 75 176* 130* 123*   D-Dimer No results for input(s): DDIMER in the last 72 hours. Hgb A1c Recent Labs     09/29/21 0953  HGBA1C 6.4*   Lipid Profile No results for input(s): CHOL, HDL, LDLCALC, TRIG, CHOLHDL, LDLDIRECT in the last 72 hours. Thyroid function studies No results for input(s): TSH, T4TOTAL, T3FREE, THYROIDAB in the last 72 hours.  Invalid input(s): FREET3 Anemia work up No results for input(s): VITAMINB12, FOLATE, FERRITIN, TIBC, IRON, RETICCTPCT in the last 72 hours. Urinalysis    Component Value Date/Time   COLORURINE YELLOW (A) 09/28/2021 2018   APPEARANCEUR CLEAR (A) 09/28/2021 2018   LABSPEC 1.019 09/28/2021 2018   PHURINE 8.0 09/28/2021 2018   GLUCOSEU NEGATIVE 09/28/2021 2018   HGBUR NEGATIVE 09/28/2021 2018   [place] NEGATIVE 09/28/2021 2018   KETONESUR 5 (A) 09/28/2021 2018   PROTEINUR 30 (A) 09/28/2021 2018   NITRITE NEGATIVE 09/28/2021 2018   LEUKOCYTESUR NEGATIVE 09/28/2021 2018   Sepsis Labs Invalid input(s): PROCALCITONIN,  WBC,  LACTICIDVEN Microbiology Recent Results (from the past 240 hour(s))  Resp Panel by RT-PCR (Flu A&B, Covid)     Status: None   Collection Time: 09/29/21  3:52 AM   Specimen: Nasopharyngeal(NP) swabs in vial transport medium  Result Value Ref Range Status   SARS Coronavirus 2 by RT PCR NEGATIVE NEGATIVE Final    Comment: (NOTE) SARS-CoV-2 target nucleic acids are NOT DETECTED.  The SARS-CoV-2 RNA is generally detectable in upper respiratory specimens during the acute phase of infection. The lowest concentration of SARS-CoV-2 viral copies this assay can detect is 138 copies/mL. A negative result does not preclude SARS-Cov-2 infection and should not be used as the sole basis for treatment or other patient management decisions. A negative result may occur with  improper specimen collection/handling, submission of specimen other than nasopharyngeal swab, presence of viral mutation(s) within the areas targeted by this assay, and inadequate number of viral copies(<138 copies/mL). A negative result must be combined  with clinical observations, patient history, and epidemiological information. The expected result is Negative.  Fact Sheet for Patients:  EntrepreneurPulse.com.au  Fact Sheet for Healthcare Providers:  IncredibleEmployment.be  This test is no t yet approved or cleared by the [place] FDA and  has been authorized for detection and/or diagnosis of SARS-CoV-2 by FDA under an Emergency Use Authorization (EUA). This EUA will remain  in effect (meaning this test can be used) for the duration of the COVID-19 declaration under Section 564(b)(1) of the Act, 21 U.S.C.section 360bbb-3(b)(1), unless the authorization is terminated  or revoked sooner.       Influenza A by PCR NEGATIVE NEGATIVE Final   Influenza B by PCR NEGATIVE NEGATIVE Final    Comment: (NOTE) The Xpert Xpress SARS-CoV-2/FLU/RSV plus assay is intended as an aid in the diagnosis of influenza from Nasopharyngeal swab specimens and should not be used as a sole basis for treatment. Nasal washings and aspirates are unacceptable for Xpert Xpress SARS-CoV-2/FLU/RSV testing.  Fact Sheet for Patients: EntrepreneurPulse.com.au  Fact Sheet for Healthcare Providers: IncredibleEmployment.be  This test is not yet approved or cleared by the [place] and has been authorized for  detection and/or diagnosis of SARS-CoV-2 by FDA under an Emergency Use Authorization (EUA). This EUA will remain in effect (meaning this test can be used) for the duration of the COVID-19 declaration under Section 564(b)(1) of the Act, 21 U.S.C. section 360bbb-3(b)(1), unless the authorization is terminated or revoked.  Performed at [facility], [address] [place], [place] [address]      Time coordinating discharge: Over 30 minutes  SIGNED:   Little Ishikawa, DO [facility] 10/01/2021, 2:30 PM Pager   If 7PM-7AM, please contact  night-coverage [email or web address]

## 2021-10-01 NOTE — Progress Notes (Signed)
Discharge instructions gone over with patient and patient verbally expressed she understood. Iv was taken out. All patient belongings sent with patient. Patient wheeled to medical mall by staff.

## 2022-01-02 ENCOUNTER — Other Ambulatory Visit: Payer: Self-pay

## 2022-01-02 ENCOUNTER — Ambulatory Visit
Admission: RE | Admit: 2022-01-02 | Discharge: 2022-01-02 | Disposition: A | Discharge status: Home / Self Care | Payer: BC Managed Care – PPO | Source: Ambulatory Visit | Attending: Oncology | Admitting: Oncology

## 2022-01-02 DIAGNOSIS — D0512 Intraductal carcinoma in situ of left breast: Secondary | ICD-10-CM | POA: Diagnosis present

## 2022-01-02 DIAGNOSIS — Z1231 Encounter for screening mammogram for malignant neoplasm of breast: Secondary | ICD-10-CM | POA: Insufficient documentation

## 2022-06-25 ENCOUNTER — Other Ambulatory Visit: Payer: Self-pay | Admitting: Physician Assistant

## 2022-06-26 ENCOUNTER — Other Ambulatory Visit: Payer: Self-pay | Admitting: Oncology

## 2022-06-26 DIAGNOSIS — Z1231 Encounter for screening mammogram for malignant neoplasm of breast: Secondary | ICD-10-CM

## 2022-07-19 ENCOUNTER — Encounter: Payer: Self-pay | Admitting: Oncology

## 2022-07-19 ENCOUNTER — Inpatient Hospital Stay (HOSPITAL_BASED_OUTPATIENT_CLINIC_OR_DEPARTMENT_OTHER): Payer: BC Managed Care – PPO | Admitting: Oncology

## 2022-07-19 ENCOUNTER — Inpatient Hospital Stay: Payer: BC Managed Care – PPO | Attending: Oncology

## 2022-07-19 VITALS — BP 130/71 | HR 60 | Temp 97.8°F | Resp 16 | Wt 177.0 lb

## 2022-07-19 DIAGNOSIS — N6099 Unspecified benign mammary dysplasia of unspecified breast: Secondary | ICD-10-CM | POA: Diagnosis not present

## 2022-07-19 DIAGNOSIS — M8580 Other specified disorders of bone density and structure, unspecified site: Secondary | ICD-10-CM | POA: Diagnosis not present

## 2022-07-19 DIAGNOSIS — Z79811 Long term (current) use of aromatase inhibitors: Secondary | ICD-10-CM | POA: Diagnosis not present

## 2022-07-19 DIAGNOSIS — E876 Hypokalemia: Secondary | ICD-10-CM | POA: Diagnosis not present

## 2022-07-19 DIAGNOSIS — D0512 Intraductal carcinoma in situ of left breast: Secondary | ICD-10-CM | POA: Diagnosis present

## 2022-07-19 LAB — COMPREHENSIVE METABOLIC PANEL
ALT: 10 U/L (ref 0–44)
AST: 15 U/L (ref 15–41)
Albumin: 3.9 g/dL (ref 3.5–5.0)
Alkaline Phosphatase: 98 U/L (ref 38–126)
Anion gap: 5 (ref 5–15)
BUN: 21 mg/dL (ref 8–23)
CO2: 29 mmol/L (ref 22–32)
Calcium: 9.4 mg/dL (ref 8.9–10.3)
Chloride: 103 mmol/L (ref 98–111)
Creatinine, Ser: 0.88 mg/dL (ref 0.44–1.00)
GFR, Estimated: >60 mL/min (ref >60)
Glucose, Bld: 134 mg/dL — ABNORMAL HIGH (ref 70–99)
Potassium: 3.3 mmol/L — ABNORMAL LOW (ref 3.5–5.1)
Sodium: 137 mmol/L (ref 135–145)
Total Bilirubin: 0.6 mg/dL (ref 0.3–1.2)
Total Protein: 7.8 g/dL (ref 6.5–8.1)

## 2022-07-19 LAB — CBC WITH DIFFERENTIAL/PLATELET
Abs Immature Granulocytes: 0.01 10*3/uL (ref 0.00–0.07)
Basophils Absolute: 0 10*3/uL (ref 0.0–0.1)
Basophils Relative: 1 %
Eosinophils Absolute: 0.2 10*3/uL (ref 0.0–0.5)
Eosinophils Relative: 2 %
HCT: 36.9 % (ref 36.0–46.0)
Hemoglobin: 12.1 g/dL (ref 12.0–15.0)
Immature Granulocytes: 0 %
Lymphocytes Relative: 42 %
Lymphs Abs: 2.8 10*3/uL (ref 0.7–4.0)
MCH: 28.1 pg (ref 26.0–34.0)
MCHC: 32.8 g/dL (ref 30.0–36.0)
MCV: 85.6 fL (ref 80.0–100.0)
Monocytes Absolute: 0.4 10*3/uL (ref 0.1–1.0)
Monocytes Relative: 7 %
Neutro Abs: 3.1 10*3/uL (ref 1.7–7.7)
Neutrophils Relative %: 48 %
Platelets: 329 10*3/uL (ref 150–400)
RBC: 4.31 MIL/uL (ref 3.87–5.11)
RDW: 15.5 % (ref 11.5–15.5)
WBC: 6.6 10*3/uL (ref 4.0–10.5)
nRBC: 0 % (ref 0.0–0.2)

## 2022-07-19 MED ORDER — ANASTROZOLE 1 MG PO TABS: ORAL_TABLET | ORAL | 3 refills | Status: DC

## 2022-07-19 MED ORDER — POTASSIUM CHLORIDE CRYS ER 20 MEQ PO TBCR: 20.0000 meq | EXTENDED_RELEASE_TABLET | Freq: Every day | ORAL | 0 refills | Status: DC

## 2022-07-19 NOTE — Progress Notes (Signed)
Patient here for oncology follow-up appointment, no new concerns  

## 2022-07-19 NOTE — Assessment & Plan Note (Signed)
take calcium and vitamin D supplementation.  May 2021 patient had a DEXA done at [place] [facility].-showed osteopenia.   Recommend DEXA study every 2 years

## 2022-07-19 NOTE — Assessment & Plan Note (Signed)
Recommend potassium 24mq daily x 3 days. Rx sent.

## 2022-07-19 NOTE — Assessment & Plan Note (Signed)
on Arimidex for chemoprevention.  Overall she tolerates very well. Labs reviewed and discussed with patient.  Continue Arimidex 1 mg daily.

## 2022-07-19 NOTE — Progress Notes (Signed)
Hematology oncology progress note   Clinic Day:  07/19/2022 ASSESSMENT & PLAN:   Ductal carcinoma in situ (DCIS) of left breast # Left breast DCIS-status post lumpectomy in May 2014.   S/p radiation and finished 5 years of tamoxifen. annual mammogram screening.  Atypical ductal hyperplasia, breast on Arimidex for chemoprevention.  Overall she tolerates very well. Labs reviewed and discussed with patient.  Continue Arimidex 1 mg daily.   Osteopenia determined by x-ray take calcium and vitamin D supplementation.  May 2021 patient had a DEXA done at [facility].-showed osteopenia.   Recommend DEXA study every 2 years  Hypokalemia Recommend potassium 50mq daily x 3 days. Rx sent.   Orders Placed This Encounter  Procedures   DG Bone Density    Standing Status:   Future    Standing Expiration Date:   07/20/2023    Order Specific Question:   Reason for Exam (SYMPTOM  OR DIAGNOSIS REQUIRED)    Answer:   DCIS of left breast    Order Specific Question:   Preferred imaging location?    Answer:   [facility]   CBC with Differential/Platelet    Standing Status:   Future    Standing Expiration Date:   07/20/2023   Comprehensive metabolic panel    Standing Status:   Future    Standing Expiration Date:   07/20/2023   Follow up in 1 year All questions were answered. The patient knows to call the clinic with any problems, questions or concerns.  Katie Server MD, PhD [facility] 07/19/2022    Chief Complaint: Katie HEARLDis a 76y.o. female presents for follow-up of left breast ADH and left breast DCIS   PERTINENT ONCOLOGY HISTORY Patient previously followed up by Dr.Corcoran, patient switched care to me on 06/07/21 Extensive medical record review was performed by me  02/15/2013 left breast DCIS status postlumpectomy. Pathology revealed a 16 mm area of grade II residual ductal carcinoma in situ, cribriform type.  Margins were clear. DCIS was ER 75%, and PR  25%.  Pathologic stage was pTis pNx.  She received accelerated partial breast irradiation, 3400 cGy in 10 fractions at 340 cGy twice a day.  She received tamoxifen from 03/2013 - 06/2018.   [facility] 02/16/2021 DEXA osteopenia 10-year risk of hip fracture 1.3, 10-year risk of any major fracture 5.6.  01/01/2021, bilateral breast screening mammogram showed possible asymmetry in the left breast. 01/18/2021, left diagnostic mammogram showed indeterminate mass in the upper inner quadrant of the left breast. 01/29/2021, left breast mass biopsy showed intraductal papilloma with sclerosing adenosis and associated calcifications.  Negative for malignancy. 02/28/2021, excision of left breast mass showed focal atypical ductal hyperplasia.  Background fibrocystic and apocrine changes with focal usual ductal hyperplasia.  Negative for residual intraductal papilloma. Negative for ductal carcinoma in situ and malignancy.  06/07/2021, started on Arimidex 1 mg daily.   INTERVAL HISTORY Katie Thompson a 76y.o. female who has above history reviewed by me today presents for follow up visit for management of history of left DCIS and left breast ADH Today patient reports feeling well.  No new complaints. Patient is currently on Arimidex 1 mg daily.  Tolerates well.  No hot flash or body aches.  Past Medical History:  Diagnosis Date   Arthritis    [place]   [place]   [place]    diet controlled  no meds   Diverticulosis feb. 2015   from colonoscopy   GERD (gastroesophageal reflux disease)    History of hiatal hernia    Hyperlipidemia    Hypertension    Personal history of radiation therapy 2014   DCIS left breast    Past Surgical History:  Procedure Laterality Date   APPENDECTOMY     BREAST BIOPSY Left 2016   coil marker, BENIGN BREAST TISSUE WITH CLUSTERED MICROCYSTS AND STROMAL  FIBROSIS.    BREAST BIOPSY Left 01/05/2018   Affirm Bx- x marker, FIBROCYSTIC CHANGE WITH MICROCALCIFICATIONS   BREAST BIOPSY Left 02/04/2013   DCIS   BREAST BIOPSY Left 01/29/2021   Stereo Bx, X-Clip, INTRADUCTAL PAPILLOMA WITH SCLEROSING ADENOSIS    BREAST BIOPSY WITH RADIO FREQUENCY LOCALIZER Left 02/28/2021   Procedure: BREAST BIOPSY WITH RADIO FREQUENCY LOCALIZER;  Surgeon: Herbert Pun, MD;  Location: [facility] ORS;  Service: General;  Laterality: Left;   BREAST EXCISIONAL BIOPSY Left 07/29/2017   SCLEROTIC INTRADUCTAL PAPILLOMA.   BREAST EXCISIONAL BIOPSY Left 02/28/2021   neg   BREAST LUMPECTOMY Left 2014   DCIS with clear margins.    BTL     EXCISION OF BREAST LESION Left 07/29/2017   Procedure: EXCISION OF BREAST MASS;  Surgeon: Leonie Green, MD;  Location: [facility] ORS;  Service: General;  Laterality: Left;   HERNIA REPAIR     NASAL SINUS SURGERY      Family History  Problem Relation Age of Onset   Ovarian cancer Mother    Hypertension Mother    Diabetes Paternal Grandmother    Hypertension Paternal Grandmother    Breast cancer Paternal Aunt 53    Social History:  reports that she has never smoked. She has never used smokeless tobacco. She reports that she does not drink alcohol and does not use drugs.  She denies any exposure to radiation or toxins.  She works at [employer] full-time.  She lives in [place].  The patient is alone today.  Allergies:  Allergies  Allergen Reactions   Codeine Nausea And Vomiting   Gatifloxacin Hives    Current Medications: Current Outpatient Medications  Medication Sig Dispense Refill   aspirin EC 81 MG tablet Take 81 mg by mouth daily.      atorvastatin (LIPITOR) 10 MG tablet Take 10 mg by mouth every morning.     Cholecalciferol (VITAMIN D3) 50 MCG (2000 UT) capsule Take 2,000 Units by mouth daily.     Cyanocobalamin (VITAMIN B12 PO) Take 1 tablet by mouth daily.     dorzolamide-timolol (COSOPT) 22.3-6.8 MG/ML  ophthalmic solution 1 drop 2 (two) times daily.     latanoprost (XALATAN) 0.005 % ophthalmic solution Place 1 drop into the left eye at bedtime.     losartan-hydrochlorothiazide (HYZAAR) 100-25 MG tablet Take 1 tablet by mouth daily.     Omeprazole 20 MG TBEC Take 20 mg by mouth every morning.     potassium chloride SA (KLOR-CON M) 20 MEQ tablet Take 1 tablet (20 mEq total) by mouth daily. 3 tablet 0   verapamil (CALAN-SR) 240 MG CR tablet Take 240 mg by mouth every morning.     anastrozole (ARIMIDEX) 1 MG tablet TAKE 1 TABLET(1 MG) BY MOUTH DAILY 90 tablet 3   No current facility-administered medications for this visit.    Review of Systems  Constitutional:  Negative for chills, fever, malaise/fatigue and weight loss.  HENT:  Negative for sore throat.   Eyes:  Negative for redness.  Respiratory:  Negative  for cough, shortness of breath and wheezing.   Cardiovascular:  Negative for chest pain, palpitations and leg swelling.  Gastrointestinal:  Negative for abdominal pain, blood in stool, nausea and vomiting.  Genitourinary:  Negative for dysuria.  Musculoskeletal:  Negative for myalgias.  Skin:  Negative for rash.  Neurological:  Negative for dizziness, tingling and tremors.  Endo/Heme/Allergies:  Does not bruise/bleed easily.  Psychiatric/Behavioral:  Negative for hallucinations.    Performance status (ECOG): 1  Vitals Blood pressure 130/71, pulse 60, temperature 97.8 F (36.6 C), temperature source Oral, resp. rate 16, weight 177 lb (80.3 kg), SpO2 98 %.   Physical Exam Constitutional:      General: She is not in acute distress.    Appearance: She is not diaphoretic.  HENT:     Head: Normocephalic and atraumatic.     Nose: Nose normal.     Mouth/Throat:     Pharynx: No oropharyngeal exudate.  Eyes:     General: No scleral icterus.    Pupils: Pupils are equal, round, and reactive to light.  Cardiovascular:     Rate and Rhythm: Normal rate and regular rhythm.     Heart  sounds: No murmur heard. Pulmonary:     Effort: Pulmonary effort is normal. No respiratory distress.     Breath sounds: No rales.  Chest:     Chest wall: No tenderness.  Abdominal:     General: There is no distension.     Palpations: Abdomen is soft.     Tenderness: There is no abdominal tenderness.  Musculoskeletal:        General: Normal range of motion.     Cervical back: Normal range of motion and neck supple.  Skin:    General: Skin is warm and dry.     Findings: No erythema.  Neurological:     Mental Status: She is alert and oriented to person, place, and time.     Cranial Nerves: No cranial nerve deficit.     Motor: No abnormal muscle tone.     Coordination: Coordination normal.  Psychiatric:        Mood and Affect: Affect normal.      Labs    Latest Ref Rng & Units 07/19/2022   11:10 AM 10/01/2021    4:00 AM 09/30/2021    4:07 AM  CBC  WBC 4.0 - 10.5 K/uL 6.6  7.3  5.9   Hemoglobin 12.0 - 15.0 g/dL 12.1  10.5  11.2   Hematocrit 36.0 - 46.0 % 36.9  31.6  33.8   Platelets 150 - 400 K/uL 329  271  284       Latest Ref Rng & Units 07/19/2022   11:10 AM 09/30/2021    4:07 AM 09/29/2021    9:53 AM  CMP  Glucose 70 - 99 mg/dL 134  89  82   BUN 8 - 23 mg/dL '21  12  15   '$ Creatinine 0.44 - 1.00 mg/dL 0.88  0.74  0.72   Sodium 135 - 145 mmol/L 137  135  133   Potassium 3.5 - 5.1 mmol/L 3.3  3.9  2.7   Chloride 98 - 111 mmol/L 103  104  97   CO2 22 - 32 mmol/L '29  25  29   '$ Calcium 8.9 - 10.3 mg/dL 9.4  9.4  9.4   Total Protein 6.5 - 8.1 g/dL 7.8     Total Bilirubin 0.3 - 1.2 mg/dL 0.6     Alkaline Phos  38 - 126 U/L 98     AST 15 - 41 U/L 15     ALT 0 - 44 U/L 10

## 2022-07-19 NOTE — Assessment & Plan Note (Signed)
#   Left breast DCIS-status post lumpectomy in May 2014.   S/p radiation and finished 5 years of tamoxifen. annual mammogram screening. 

## 2022-09-23 ENCOUNTER — Ambulatory Visit
Admission: RE | Admit: 2022-09-23 | Discharge: 2022-09-23 | Disposition: A | Discharge status: Home / Self Care | Payer: BC Managed Care – PPO | Source: Ambulatory Visit | Attending: Oncology | Admitting: Oncology

## 2022-09-23 DIAGNOSIS — M810 Age-related osteoporosis without current pathological fracture: Secondary | ICD-10-CM | POA: Insufficient documentation

## 2022-09-23 DIAGNOSIS — Z780 Asymptomatic menopausal state: Secondary | ICD-10-CM | POA: Diagnosis not present

## 2022-09-23 DIAGNOSIS — Z853 Personal history of malignant neoplasm of breast: Secondary | ICD-10-CM | POA: Diagnosis not present

## 2022-09-23 DIAGNOSIS — M8580 Other specified disorders of bone density and structure, unspecified site: Secondary | ICD-10-CM | POA: Diagnosis present

## 2022-09-26 ENCOUNTER — Telehealth: Payer: Self-pay

## 2022-09-26 NOTE — Telephone Encounter (Signed)
-----   Message from Earlie Server, MD sent at 09/26/2022  8:50 AM EST ----- Please let her know that her DEXA showed worse bone loss- osteoporosis, she has increased risk of bone fracture. This could be due to Arimidex.  I would like to see her again to discuss about bone strengthening medication, and possible change of her Arimidex to other alternatives.

## 2022-09-26 NOTE — Telephone Encounter (Signed)
Patient contacted and informed of MD recommendation and verbalized understanding.   Please schedule pt for MD visit only and inform pt of appt.

## 2022-10-03 ENCOUNTER — Encounter: Payer: Self-pay | Admitting: Oncology

## 2022-10-03 ENCOUNTER — Inpatient Hospital Stay: Payer: BC Managed Care – PPO | Attending: Oncology | Admitting: Oncology

## 2022-10-03 VITALS — BP 142/84 | HR 65 | Temp 96.7°F | Resp 18 | Wt 178.6 lb

## 2022-10-03 DIAGNOSIS — D0512 Intraductal carcinoma in situ of left breast: Secondary | ICD-10-CM

## 2022-10-03 DIAGNOSIS — M810 Age-related osteoporosis without current pathological fracture: Secondary | ICD-10-CM | POA: Diagnosis not present

## 2022-10-03 DIAGNOSIS — N6099 Unspecified benign mammary dysplasia of unspecified breast: Secondary | ICD-10-CM

## 2022-10-03 DIAGNOSIS — N6092 Unspecified benign mammary dysplasia of left breast: Secondary | ICD-10-CM | POA: Insufficient documentation

## 2022-10-03 DIAGNOSIS — Z79811 Long term (current) use of aromatase inhibitors: Secondary | ICD-10-CM | POA: Diagnosis not present

## 2022-10-03 DIAGNOSIS — Z853 Personal history of malignant neoplasm of breast: Secondary | ICD-10-CM | POA: Insufficient documentation

## 2022-10-03 MED ORDER — TAMOXIFEN CITRATE 20 MG PO TABS: 20.0000 mg | ORAL_TABLET | Freq: Every day | ORAL | 1 refills | Status: DC

## 2022-10-03 NOTE — Assessment & Plan Note (Addendum)
on Arimidex for chemoprevention.  Overall she tolerates very well. Labs reviewed and discussed with patient. Patient has developed osteoporosis with a T-score -3.4.  Side effects of Arimidex reviewed with patient. Option of switch to Tamoxifen was discussed with the patient.  She agrees with the plan.  Rationale and side effects were reviewed with.  Recommend annual gynecology pelvic examination.

## 2022-10-04 NOTE — Assessment & Plan Note (Signed)
#   Left breast DCIS-status post lumpectomy in May 2014.   S/p radiation and finished 5 years of tamoxifen. annual mammogram screening.

## 2022-10-04 NOTE — Assessment & Plan Note (Signed)
09/23/2022 DEXA showed  Osteoporosis T-score -3.4  Recommend calcium and vitamin D supplementation. Discussed with patient about the rationale potential side effects of Zometa every 6 months.  She agrees with the plan.  She has no teeth and denies any jaw pain currently.  Will arrange patient to start with Zometa.

## 2022-10-04 NOTE — Progress Notes (Signed)
Hematology/Oncology Progress note [phone] [phone]      Clinic Day:  10/04/2022 ASSESSMENT & PLAN:   Atypical ductal hyperplasia, breast on Arimidex for chemoprevention.  Overall she tolerates very well. Labs reviewed and discussed with patient. Patient has developed osteoporosis with a T-score -3.4.  Side effects of Arimidex reviewed with patient. Option of switch to Tamoxifen was discussed with the patient.  She agrees with the plan.  Rationale and side effects were reviewed with.  Recommend annual gynecology pelvic examination.   Ductal carcinoma in situ (DCIS) of left breast # Left breast DCIS-status post lumpectomy in May 2014.   S/p radiation and finished 5 years of tamoxifen. annual mammogram screening.  Osteoporosis 09/23/2022 DEXA showed  Osteoporosis T-score -3.4  Recommend calcium and vitamin D supplementation. Discussed with patient about the rationale potential side effects of Zometa every 6 months.  She agrees with the plan.  She has no teeth and denies any jaw pain currently.  Will arrange patient to start with Zometa.  Orders Placed This Encounter  Procedures   Basic metabolic panel    Standing Status:   Future    Standing Expiration Date:   10/03/2023   CBC with Differential/Platelet    Standing Status:   Future    Standing Expiration Date:   10/04/2023   Comprehensive metabolic panel    Standing Status:   Future    Standing Expiration Date:   10/03/2023   Follow up in 6 months All questions were answered. The patient knows to call the clinic with any problems, questions or concerns.  Earlie Server, MD, PhD [facility] Hematology Oncology 10/03/2022    Chief Complaint: Katie Thompson is a 76 y.o. female presents for follow-up of left breast ADH and left breast DCIS   PERTINENT ONCOLOGY HISTORY Patient previously followed up by Dr.Corcoran, patient switched care to me on 06/07/21 Extensive medical record review was performed by  me  02/15/2013 left breast DCIS status postlumpectomy. Pathology revealed a 16 mm area of grade II residual ductal carcinoma in situ, cribriform type.  Margins were clear. DCIS was ER 75%, and PR 25%.  Pathologic stage was pTis pNx.  She received accelerated partial breast irradiation, 3400 cGy in 10 fractions at 340 cGy twice a day.  She received tamoxifen from 03/2013 - 06/2018.   Osteoporosis-patient has bone density done through [facility]. 02/16/2021 DEXA osteopenia 10-year risk of hip fracture 1.3, 10-year risk of any major fracture 5.6.  01/01/2021, bilateral breast screening mammogram showed possible asymmetry in the left breast. 01/18/2021, left diagnostic mammogram showed indeterminate mass in the upper inner quadrant of the left breast. 01/29/2021, left breast mass biopsy showed intraductal papilloma with sclerosing adenosis and associated calcifications.  Negative for malignancy. 02/28/2021, excision of left breast mass showed focal atypical ductal hyperplasia.  Background fibrocystic and apocrine changes with focal usual ductal hyperplasia.  Negative for residual intraductal papilloma. Negative for ductal carcinoma in situ and malignancy.  06/07/2021, started on Arimidex 1 mg daily. 10/03/2022, switched to tamoxifen due to osteoporosis.   INTERVAL HISTORY Katie Thompson is a 76 y.o. female who has above history reviewed by me today presents for follow up visit for management of history of left DCIS and left breast ADH Today patient reports feeling well.  No new complaints. Patient is currently on Arimidex 1 mg daily.  Tolerates well.  No hot flash or body aches. She was today had a DEXA study done which showed progression from osteopenia to  osteoporosis.  Patient presented to discuss results and management plan.  Past Medical History:  Diagnosis Date   Arthritis    Breast cancer ([place]) 2014   LT with radiation   Diabetes mellitus without complication ([place])    diet controlled  no meds   Diverticulosis feb. 2015   from colonoscopy   GERD (gastroesophageal reflux disease)    History of hiatal hernia    Hyperlipidemia    Hypertension    Personal history of radiation therapy 2014   DCIS left breast    Past Surgical History:  Procedure Laterality Date   APPENDECTOMY     BREAST BIOPSY Left 2016   coil marker, BENIGN BREAST TISSUE WITH CLUSTERED MICROCYSTS AND STROMAL FIBROSIS.    BREAST BIOPSY Left 01/05/2018   Affirm Bx- x marker, FIBROCYSTIC CHANGE WITH MICROCALCIFICATIONS   BREAST BIOPSY Left 02/04/2013   DCIS   BREAST BIOPSY Left 01/29/2021   Stereo Bx, X-Clip, INTRADUCTAL PAPILLOMA WITH SCLEROSING ADENOSIS    BREAST BIOPSY WITH RADIO FREQUENCY LOCALIZER Left 02/28/2021   Procedure: BREAST BIOPSY WITH RADIO FREQUENCY LOCALIZER;  Surgeon: Herbert Pun, MD;  Location: [facility] ORS;  Service: General;  Laterality: Left;   BREAST EXCISIONAL BIOPSY Left 07/29/2017   SCLEROTIC INTRADUCTAL PAPILLOMA.   BREAST EXCISIONAL BIOPSY Left 02/28/2021   neg   BREAST LUMPECTOMY Left 2014   DCIS with clear margins.    BTL     EXCISION OF BREAST LESION Left 07/29/2017   Procedure: EXCISION OF BREAST MASS;  Surgeon: Leonie Green, MD;  Location: [facility] ORS;  Service: General;  Laterality: Left;   HERNIA REPAIR     NASAL SINUS SURGERY      Family History  Problem Relation Age of Onset   Ovarian cancer Mother    Hypertension Mother    Diabetes Paternal Grandmother    Hypertension Paternal Grandmother    Breast cancer Paternal Aunt 56    Social History:  reports that she has never smoked. She has never used smokeless tobacco. She reports that she does not drink alcohol and does not use drugs.  She denies any exposure to radiation or toxins.  She works at [employer] full-time.  She lives in [place].  The patient is alone today.  Allergies:  Allergies  Allergen Reactions   Codeine Nausea And Vomiting   Gatifloxacin Hives    Current  Medications: Current Outpatient Medications  Medication Sig Dispense Refill   aspirin EC 81 MG tablet Take 81 mg by mouth daily.      atorvastatin (LIPITOR) 10 MG tablet Take 10 mg by mouth every morning.     Cholecalciferol (VITAMIN D3) 50 MCG (2000 UT) capsule Take 2,000 Units by mouth daily.     Cyanocobalamin (VITAMIN B12 PO) Take 1 tablet by mouth daily.     dorzolamide-timolol (COSOPT) 22.3-6.8 MG/ML ophthalmic solution 1 drop 2 (two) times daily.     latanoprost (XALATAN) 0.005 % ophthalmic solution Place 1 drop into the left eye at bedtime.     losartan-hydrochlorothiazide (HYZAAR) 100-25 MG tablet Take 1 tablet by mouth daily.     Omeprazole 20 MG TBEC Take 20 mg by mouth every morning.     potassium chloride SA (KLOR-CON M) 20 MEQ tablet Take 1 tablet (20 mEq total) by mouth daily. 3 tablet 0   tamoxifen (NOLVADEX) 20 MG tablet Take 1 tablet (20 mg total) by mouth daily. 90 tablet 1   verapamil (CALAN-SR) 240 MG CR tablet Take 240 mg by mouth every morning.  No current facility-administered medications for this visit.    Review of Systems  Constitutional:  Negative for chills, fever, malaise/fatigue and weight loss.  HENT:  Negative for sore throat.   Eyes:  Negative for redness.  Respiratory:  Negative for cough, shortness of breath and wheezing.   Cardiovascular:  Negative for chest pain, palpitations and leg swelling.  Gastrointestinal:  Negative for abdominal pain, blood in stool, nausea and vomiting.  Genitourinary:  Negative for dysuria.  Musculoskeletal:  Negative for myalgias.  Skin:  Negative for rash.  Neurological:  Negative for dizziness, tingling and tremors.  Endo/Heme/Allergies:  Does not bruise/bleed easily.  Psychiatric/Behavioral:  Negative for hallucinations.    Performance status (ECOG): 1  Vitals Blood pressure (!) 142/84, pulse 65, temperature (!) 96.7 F (35.9 C), resp. rate 18, weight 178 lb 9.6 oz (81 kg).   Physical Exam Constitutional:       General: She is not in acute distress.    Appearance: She is not diaphoretic.  HENT:     Head: Normocephalic and atraumatic.     Nose: Nose normal.     Mouth/Throat:     Pharynx: No oropharyngeal exudate.  Eyes:     General: No scleral icterus.    Pupils: Pupils are equal, round, and reactive to light.  Cardiovascular:     Rate and Rhythm: Normal rate and regular rhythm.     Heart sounds: No murmur heard. Pulmonary:     Effort: Pulmonary effort is normal. No respiratory distress.     Breath sounds: No rales.  Chest:     Chest wall: No tenderness.  Abdominal:     General: There is no distension.     Palpations: Abdomen is soft.     Tenderness: There is no abdominal tenderness.  Musculoskeletal:        General: Normal range of motion.     Cervical back: Normal range of motion and neck supple.  Skin:    General: Skin is warm and dry.     Findings: No erythema.  Neurological:     Mental Status: She is alert and oriented to person, place, and time.     Cranial Nerves: No cranial nerve deficit.     Motor: No abnormal muscle tone.     Coordination: Coordination normal.  Psychiatric:        Mood and Affect: Affect normal.      Labs    Latest Ref Rng & Units 07/19/2022   11:10 AM 10/01/2021    4:00 AM 09/30/2021    4:07 AM  CBC  WBC 4.0 - 10.5 K/uL 6.6  7.3  5.9   Hemoglobin 12.0 - 15.0 g/dL 12.1  10.5  11.2   Hematocrit 36.0 - 46.0 % 36.9  31.6  33.8   Platelets 150 - 400 K/uL 329  271  284       Latest Ref Rng & Units 07/19/2022   11:10 AM 09/30/2021    4:07 AM 09/29/2021    9:53 AM  CMP  Glucose 70 - 99 mg/dL 134  89  82   BUN 8 - 23 mg/dL '21  12  15   '$ Creatinine 0.44 - 1.00 mg/dL 0.88  0.74  0.72   Sodium 135 - 145 mmol/L 137  135  133   Potassium 3.5 - 5.1 mmol/L 3.3  3.9  2.7   Chloride 98 - 111 mmol/L 103  104  97   CO2 22 - 32 mmol/L 29  25  29   Calcium 8.9 - 10.3 mg/dL 9.4  9.4  9.4   Total Protein 6.5 - 8.1 g/dL 7.8     Total Bilirubin 0.3 - 1.2  mg/dL 0.6     Alkaline Phos 38 - 126 U/L 98     AST 15 - 41 U/L 15     ALT 0 - 44 U/L 10

## 2022-10-10 ENCOUNTER — Other Ambulatory Visit: Payer: Self-pay | Admitting: Oncology

## 2022-10-10 ENCOUNTER — Inpatient Hospital Stay: Payer: BC Managed Care – PPO

## 2022-10-10 ENCOUNTER — Telehealth: Payer: Self-pay

## 2022-10-10 VITALS — BP 135/73 | HR 73 | Temp 97.7°F | Resp 16

## 2022-10-10 DIAGNOSIS — N6099 Unspecified benign mammary dysplasia of unspecified breast: Secondary | ICD-10-CM

## 2022-10-10 DIAGNOSIS — N6092 Unspecified benign mammary dysplasia of left breast: Secondary | ICD-10-CM | POA: Diagnosis not present

## 2022-10-10 DIAGNOSIS — M810 Age-related osteoporosis without current pathological fracture: Secondary | ICD-10-CM

## 2022-10-10 DIAGNOSIS — E876 Hypokalemia: Secondary | ICD-10-CM

## 2022-10-10 LAB — BASIC METABOLIC PANEL
Anion gap: 7 (ref 5–15)
BUN: 22 mg/dL (ref 8–23)
CO2: 28 mmol/L (ref 22–32)
Calcium: 9.2 mg/dL (ref 8.9–10.3)
Chloride: 105 mmol/L (ref 98–111)
Creatinine, Ser: 0.86 mg/dL (ref 0.44–1.00)
GFR, Estimated: >60 mL/min (ref >60)
Glucose, Bld: 112 mg/dL — ABNORMAL HIGH (ref 70–99)
Potassium: 2.8 mmol/L — ABNORMAL LOW (ref 3.5–5.1)
Sodium: 140 mmol/L (ref 135–145)

## 2022-10-10 MED ORDER — POTASSIUM CHLORIDE CRYS ER 20 MEQ PO TBCR: 20.0000 meq | EXTENDED_RELEASE_TABLET | Freq: Every day | ORAL | 0 refills | Status: DC

## 2022-10-10 MED ORDER — SODIUM CHLORIDE 0.9 % IV SOLN
Freq: Once | INTRAVENOUS | Status: AC
  Filled 2022-10-10: qty 250

## 2022-10-10 MED ORDER — ZOLEDRONIC ACID 4 MG/100ML IV SOLN
4.0000 mg | Freq: Once | INTRAVENOUS | Status: AC
  Administered 2022-10-10: 4 mg via INTRAVENOUS
  Filled 2022-10-10: qty 100

## 2022-10-10 NOTE — Telephone Encounter (Signed)
Per Dr. Tasia Catchings, pt's K is 2.8 and she has sent in oral potassium  to pharmacy, for pt to start taking today. She would also like to have pt come in for IV potassium tomorrow. I have contacted pt and informed her that oral K was sent and that she will need lab/ Potassium tomorrow. Pt unsure that she will be able to come due to work schedule, but she will contact us if she is unable to come.   If she does not come tomorrow she will need lab/ poss K next week, If she does come to appt tomorrow, she will need lab (bmp) / poss k in 2 weeks.

## 2022-10-10 NOTE — Patient Instructions (Signed)

## 2022-10-11 ENCOUNTER — Inpatient Hospital Stay: Payer: BC Managed Care – PPO

## 2022-10-11 ENCOUNTER — Encounter: Payer: Self-pay | Admitting: Oncology

## 2022-10-11 VITALS — BP 103/66 | HR 73 | Temp 99.4°F | Resp 20

## 2022-10-11 DIAGNOSIS — E876 Hypokalemia: Secondary | ICD-10-CM

## 2022-10-11 DIAGNOSIS — N6092 Unspecified benign mammary dysplasia of left breast: Secondary | ICD-10-CM | POA: Diagnosis not present

## 2022-10-11 LAB — BASIC METABOLIC PANEL
Anion gap: 10 (ref 5–15)
BUN: 15 mg/dL (ref 8–23)
CO2: 23 mmol/L (ref 22–32)
Calcium: 8.8 mg/dL — ABNORMAL LOW (ref 8.9–10.3)
Chloride: 102 mmol/L (ref 98–111)
Creatinine, Ser: 0.9 mg/dL (ref 0.44–1.00)
GFR, Estimated: >60 mL/min (ref >60)
Glucose, Bld: 109 mg/dL — ABNORMAL HIGH (ref 70–99)
Potassium: 2.9 mmol/L — ABNORMAL LOW (ref 3.5–5.1)
Sodium: 135 mmol/L (ref 135–145)

## 2022-10-11 MED ORDER — POTASSIUM CHLORIDE IN NACL 20-0.9 MEQ/L-% IV SOLN
Freq: Once | INTRAVENOUS | Status: AC
  Filled 2022-10-11: qty 1000

## 2022-10-11 NOTE — Patient Instructions (Signed)
Hypokalemia Hypokalemia means that the amount of potassium in the blood is lower than normal. Potassium is a mineral (electrolyte) that helps regulate the amount of fluid in the body. It also stimulates muscle tightening (contraction) and helps nerves work properly. Normally, most of the body's potassium is inside cells, and only a very small amount is in the blood. Because the amount in the blood is so small, minor changes to potassium levels in the blood can be life-threatening. What are the causes? This condition may be caused by: Antibiotic medicine. Diarrhea or vomiting. Taking too much of a medicine that helps you have a bowel movement (laxative) can cause diarrhea and lead to hypokalemia. Chronic kidney disease (CKD). Medicines that help the body get rid of excess fluid (diuretics). Eating disorders, such as anorexia or bulimia. Low magnesium levels in the body. Sweating a lot. What are the signs or symptoms? Symptoms of this condition include: Weakness. Constipation. Fatigue. Muscle cramps. Mental confusion. Skipped heartbeats or irregular heartbeat (palpitations). Tingling or numbness. How is this diagnosed? This condition is diagnosed with a blood test. How is this treated? This condition may be treated by: Taking potassium supplements. Adjusting the medicines that you take. Eating more foods that contain a lot of potassium. If your potassium level is very low, you may need to get potassium through an IV and be monitored in the hospital. Follow these instructions at home: Eating and drinking  Eat a healthy diet. A healthy diet includes fresh fruits and vegetables, whole grains, healthy fats, and lean proteins. If told, eat more foods that contain a lot of potassium. These include: Nuts, such as peanuts and pistachios. Seeds, such as sunflower seeds and pumpkin seeds. Peas, lentils, and lima beans. Whole grain and bran cereals and breads. Fresh fruits and vegetables,  such as apricots, avocado, bananas, cantaloupe, kiwi, oranges, tomatoes, asparagus, and potatoes. Juices, such as orange, tomato, and prune. Lean meats, including fish. Milk and milk products, such as yogurt. General instructions Take over-the-counter and prescription medicines only as told by your health care provider. This includes vitamins, natural food products, and supplements. Keep all follow-up visits. This is important. Contact a health care provider if: You have weakness that gets worse. You feel your heart pounding or racing. You vomit. You have diarrhea. You have diabetes and you have trouble keeping your blood sugar in your target range. Get help right away if: You have chest pain. You have shortness of breath. You have vomiting or diarrhea that lasts for more than 2 days. You faint. These symptoms may be an emergency. Get help right away. Call 911. Do not wait to see if the symptoms will go away. Do not drive yourself to the hospital. Summary Hypokalemia means that the amount of potassium in the blood is lower than normal. This condition is diagnosed with a blood test. Hypokalemia may be treated by taking potassium supplements, adjusting the medicines that you take, or eating more foods that are high in potassium. If your potassium level is very low, you may need to get potassium through an IV and be monitored in the hospital. This information is not intended to replace advice given to you by your health care provider. Make sure you discuss any questions you have with your health care provider. Document Revised: 06/14/2021 Document Reviewed: 06/14/2021 Elsevier Patient Education  2023 [employer]  

## 2022-10-11 NOTE — Telephone Encounter (Signed)
Pt scheduled to come back for Lab/ Poss K on 1/11. Magda Paganini will print AVS for pt

## 2022-10-23 ENCOUNTER — Encounter: Payer: Self-pay | Admitting: Oncology

## 2022-10-24 ENCOUNTER — Inpatient Hospital Stay: Payer: BC Managed Care – PPO | Attending: Oncology

## 2022-10-24 ENCOUNTER — Inpatient Hospital Stay: Payer: BC Managed Care – PPO

## 2022-10-24 DIAGNOSIS — N6092 Unspecified benign mammary dysplasia of left breast: Secondary | ICD-10-CM | POA: Diagnosis not present

## 2022-10-24 DIAGNOSIS — E876 Hypokalemia: Secondary | ICD-10-CM

## 2022-10-24 LAB — BASIC METABOLIC PANEL
Anion gap: 9 (ref 5–15)
BUN: 20 mg/dL (ref 8–23)
CO2: 24 mmol/L (ref 22–32)
Calcium: 8.9 mg/dL (ref 8.9–10.3)
Chloride: 101 mmol/L (ref 98–111)
Creatinine, Ser: 1.08 mg/dL — ABNORMAL HIGH (ref 0.44–1.00)
GFR, Estimated: 53 mL/min — ABNORMAL LOW (ref >60)
Glucose, Bld: 127 mg/dL — ABNORMAL HIGH (ref 70–99)
Potassium: 3.7 mmol/L (ref 3.5–5.1)
Sodium: 134 mmol/L — ABNORMAL LOW (ref 135–145)

## 2022-10-24 NOTE — Progress Notes (Signed)
Patient notified K is 3.7 today per MD no IV K at this time. Patient discharged, stable. Aware of future apts. AVS given

## 2022-11-27 ENCOUNTER — Other Ambulatory Visit: Payer: Self-pay | Admitting: Oncology

## 2022-11-27 NOTE — Telephone Encounter (Signed)
  Component Ref Range & Units 1 mo ago (10/24/22) 1 mo ago (10/11/22) 1 mo ago (10/10/22) 4 mo ago (07/19/22) 1 yr ago (09/30/21) 1 yr ago (09/29/21) 1 yr ago (09/28/21)  Potassium 3.5 - 5.1 mmol/L 3.7 2.9 Low  2.8 Low  3.3 Low  3.9

## 2022-12-27 ENCOUNTER — Other Ambulatory Visit: Payer: Self-pay

## 2022-12-30 ENCOUNTER — Encounter: Payer: Self-pay | Admitting: Oncology

## 2023-01-06 ENCOUNTER — Ambulatory Visit
Admission: RE | Admit: 2023-01-06 | Discharge: 2023-01-06 | Disposition: A | Discharge status: Home / Self Care | Payer: BC Managed Care – PPO | Source: Ambulatory Visit | Attending: Oncology | Admitting: Oncology

## 2023-01-06 DIAGNOSIS — Z1231 Encounter for screening mammogram for malignant neoplasm of breast: Secondary | ICD-10-CM | POA: Diagnosis not present

## 2023-01-30 DIAGNOSIS — H401111 Primary open-angle glaucoma, right eye, mild stage: Secondary | ICD-10-CM | POA: Diagnosis not present

## 2023-01-30 DIAGNOSIS — H401122 Primary open-angle glaucoma, left eye, moderate stage: Secondary | ICD-10-CM | POA: Diagnosis not present

## 2023-01-30 DIAGNOSIS — H2512 Age-related nuclear cataract, left eye: Secondary | ICD-10-CM | POA: Diagnosis not present

## 2023-02-26 DIAGNOSIS — E119 Type 2 diabetes mellitus without complications: Secondary | ICD-10-CM | POA: Diagnosis not present

## 2023-03-05 DIAGNOSIS — I10 Essential (primary) hypertension: Secondary | ICD-10-CM | POA: Diagnosis not present

## 2023-03-05 DIAGNOSIS — Z0000 Encounter for general adult medical examination without abnormal findings: Secondary | ICD-10-CM | POA: Diagnosis not present

## 2023-03-05 DIAGNOSIS — E7800 Pure hypercholesterolemia, unspecified: Secondary | ICD-10-CM | POA: Diagnosis not present

## 2023-03-05 DIAGNOSIS — E119 Type 2 diabetes mellitus without complications: Secondary | ICD-10-CM | POA: Diagnosis not present

## 2023-03-05 DIAGNOSIS — Z1331 Encounter for screening for depression: Secondary | ICD-10-CM | POA: Diagnosis not present

## 2023-03-05 DIAGNOSIS — Z0001 Encounter for general adult medical examination with abnormal findings: Secondary | ICD-10-CM | POA: Diagnosis not present

## 2023-04-10 ENCOUNTER — Inpatient Hospital Stay (HOSPITAL_BASED_OUTPATIENT_CLINIC_OR_DEPARTMENT_OTHER): Payer: BC Managed Care – PPO | Admitting: Oncology

## 2023-04-10 ENCOUNTER — Inpatient Hospital Stay: Payer: BC Managed Care – PPO

## 2023-04-10 ENCOUNTER — Encounter: Payer: Self-pay | Admitting: Oncology

## 2023-04-10 ENCOUNTER — Inpatient Hospital Stay: Payer: BC Managed Care – PPO | Attending: Oncology

## 2023-04-10 VITALS — BP 143/72 | HR 65 | Temp 96.8°F | Resp 16 | Ht 63.0 in | Wt 182.5 lb

## 2023-04-10 DIAGNOSIS — M810 Age-related osteoporosis without current pathological fracture: Secondary | ICD-10-CM | POA: Insufficient documentation

## 2023-04-10 DIAGNOSIS — D0512 Intraductal carcinoma in situ of left breast: Secondary | ICD-10-CM

## 2023-04-10 DIAGNOSIS — Z79810 Long term (current) use of selective estrogen receptor modulators (SERMs): Secondary | ICD-10-CM | POA: Insufficient documentation

## 2023-04-10 DIAGNOSIS — N6092 Unspecified benign mammary dysplasia of left breast: Secondary | ICD-10-CM | POA: Insufficient documentation

## 2023-04-10 DIAGNOSIS — N6099 Unspecified benign mammary dysplasia of unspecified breast: Secondary | ICD-10-CM | POA: Diagnosis not present

## 2023-04-10 LAB — CBC WITH DIFFERENTIAL/PLATELET
Abs Immature Granulocytes: 0.02 10*3/uL (ref 0.00–0.07)
Basophils Absolute: 0.1 10*3/uL (ref 0.0–0.1)
Basophils Relative: 1 %
Eosinophils Absolute: 0.1 10*3/uL (ref 0.0–0.5)
Eosinophils Relative: 1 %
HCT: 34.3 % — ABNORMAL LOW (ref 36.0–46.0)
Hemoglobin: 11.5 g/dL — ABNORMAL LOW (ref 12.0–15.0)
Immature Granulocytes: 0 %
Lymphocytes Relative: 43 %
Lymphs Abs: 3.1 10*3/uL (ref 0.7–4.0)
MCH: 28.4 pg (ref 26.0–34.0)
MCHC: 33.5 g/dL (ref 30.0–36.0)
MCV: 84.7 fL (ref 80.0–100.0)
Monocytes Absolute: 0.6 10*3/uL (ref 0.1–1.0)
Monocytes Relative: 8 %
Neutro Abs: 3.5 10*3/uL (ref 1.7–7.7)
Neutrophils Relative %: 47 %
Platelets: 295 10*3/uL (ref 150–400)
RBC: 4.05 MIL/uL (ref 3.87–5.11)
RDW: 15.6 % — ABNORMAL HIGH (ref 11.5–15.5)
WBC: 7.3 10*3/uL (ref 4.0–10.5)
nRBC: 0 % (ref 0.0–0.2)

## 2023-04-10 LAB — COMPREHENSIVE METABOLIC PANEL
ALT: 9 U/L (ref 0–44)
AST: 17 U/L (ref 15–41)
Albumin: 3.7 g/dL (ref 3.5–5.0)
Alkaline Phosphatase: 57 U/L (ref 38–126)
Anion gap: 9 (ref 5–15)
BUN: 15 mg/dL (ref 8–23)
CO2: 26 mmol/L (ref 22–32)
Calcium: 9.4 mg/dL (ref 8.9–10.3)
Chloride: 102 mmol/L (ref 98–111)
Creatinine, Ser: 1.03 mg/dL — ABNORMAL HIGH (ref 0.44–1.00)
GFR, Estimated: 55 mL/min — ABNORMAL LOW (ref >60)
Glucose, Bld: 132 mg/dL — ABNORMAL HIGH (ref 70–99)
Potassium: 3.6 mmol/L (ref 3.5–5.1)
Sodium: 137 mmol/L (ref 135–145)
Total Bilirubin: 0.3 mg/dL (ref 0.3–1.2)
Total Protein: 7.6 g/dL (ref 6.5–8.1)

## 2023-04-10 MED ORDER — SODIUM CHLORIDE 0.9 % IV SOLN
Freq: Once | INTRAVENOUS | Status: AC
  Filled 2023-04-10: qty 250

## 2023-04-10 MED ORDER — TAMOXIFEN CITRATE 20 MG PO TABS: 20.0000 mg | ORAL_TABLET | Freq: Every day | ORAL | 1 refills | Status: DC

## 2023-04-10 MED ORDER — ZOLEDRONIC ACID 4 MG/5ML IV CONC
3.5000 mg | Freq: Once | INTRAVENOUS | Status: AC
  Administered 2023-04-10: 3.5 mg via INTRAVENOUS
  Filled 2023-04-10: qty 4.38

## 2023-04-10 NOTE — Assessment & Plan Note (Signed)
#   Left breast DCIS-status post lumpectomy in May 2014.   S/p radiation and previously finished 5 years of tamoxifen. annual mammogram screening.

## 2023-04-10 NOTE — Assessment & Plan Note (Addendum)
09/23/2022 DEXA showed  Osteoporosis T-score -3.4  Recommend calcium and vitamin D supplementation. Zometa 3.5mg  Q6 months.

## 2023-04-10 NOTE — Progress Notes (Signed)
Hematology/Oncology Progress note [phone] [phone]      Clinic Day:  04/10/2023 ASSESSMENT & PLAN:   Atypical ductal hyperplasia, breast Patient has developed osteoporosis with a T-score -3.4.   Previously on AI, switch to Tamoxifen 20mg  daily, she tolerates well.  Recommend annual gynecology pelvic examination.   Osteoporosis 09/23/2022 DEXA showed  Osteoporosis T-score -3.4  Recommend calcium and vitamin D supplementation. Zometa 3.5mg  Q6 months.  Ductal carcinoma in situ (DCIS) of left breast # Left breast DCIS-status post lumpectomy in May 2014.   S/p radiation and previously finished 5 years of tamoxifen. annual mammogram screening.  Orders Placed This Encounter  Procedures   CBC with Differential (Cancer Center Only)    Standing Status:   Future    Standing Expiration Date:   04/09/2024   CMP (Cancer Center only)    Standing Status:   Future    Standing Expiration Date:   04/09/2024   Follow up in 6 months All questions were answered. The patient knows to call the clinic with any problems, questions or concerns.  Rickard Patience, MD, PhD [facility] Hematology Oncology 04/10/2023    Chief Complaint: Katie Thompson is a 77 y.o. female presents for follow-up of left breast ADH and left breast DCIS   PERTINENT ONCOLOGY HISTORY Patient previously followed up by Dr.Corcoran, patient switched care to me on 06/07/21 Extensive medical record review was performed by me  02/15/2013 left breast DCIS status postlumpectomy. Pathology revealed a 16 mm area of grade II residual ductal carcinoma in situ, cribriform type.  Margins were clear. DCIS was ER 75%, and PR 25%.  Pathologic stage was pTis pNx.  She received accelerated partial breast irradiation, 3400 cGy in 10 fractions at 340 cGy twice a day.  She received tamoxifen from 03/2013 - 06/2018.   Osteoporosis-patient has bone density done through [facility]. 02/16/2021 DEXA osteopenia 10-year  risk of hip fracture 1.3, 10-year risk of any major fracture 5.6.  01/01/2021, bilateral breast screening mammogram showed possible asymmetry in the left breast. 01/18/2021, left diagnostic mammogram showed indeterminate mass in the upper inner quadrant of the left breast. 01/29/2021, left breast mass biopsy showed intraductal papilloma with sclerosing adenosis and associated calcifications.  Negative for malignancy. 02/28/2021, excision of left breast mass showed focal atypical ductal hyperplasia.  Background fibrocystic and apocrine changes with focal usual ductal hyperplasia.  Negative for residual intraductal papilloma. Negative for ductal carcinoma in situ and malignancy.  06/07/2021, started on Arimidex 1 mg daily. 10/03/2022, switched to tamoxifen due to osteoporosis.   INTERVAL HISTORY Katie Thompson is a 77 y.o. female who has above history reviewed by me today presents for follow up visit for management of history of left DCIS and left breast ADH Today patient reports feeling well.  No new complaints. She tolerate Tamoxifen 20mg  daily.  No new complains.  12/18/22 bilateral screening mammogram showed no malignancy.   Past Medical History:  Diagnosis Date   Arthritis    Breast cancer (HCC) 2014   LT with radiation   Diabetes mellitus without complication (HCC)    diet controlled no meds   Diverticulosis feb. 2015   from colonoscopy   GERD (gastroesophageal reflux disease)    History of hiatal hernia    Hyperlipidemia    Hypertension    Personal history of radiation therapy 2014   DCIS left breast    Past Surgical History:  Procedure Laterality Date   APPENDECTOMY     BREAST BIOPSY Left 2016  coil marker, BENIGN BREAST TISSUE WITH CLUSTERED MICROCYSTS AND STROMAL FIBROSIS.    BREAST BIOPSY Left 01/05/2018   Affirm Bx- x marker, FIBROCYSTIC CHANGE WITH MICROCALCIFICATIONS   BREAST BIOPSY Left 02/04/2013   DCIS   BREAST BIOPSY Left 01/29/2021   Stereo Bx, X-Clip,  INTRADUCTAL PAPILLOMA WITH SCLEROSING ADENOSIS    BREAST BIOPSY WITH RADIO FREQUENCY LOCALIZER Left 02/28/2021   Procedure: BREAST BIOPSY WITH RADIO FREQUENCY LOCALIZER;  Surgeon: Carolan Shiver, MD;  Location: [facility] ORS;  Service: General;  Laterality: Left;   BREAST EXCISIONAL BIOPSY Left 07/29/2017   SCLEROTIC INTRADUCTAL PAPILLOMA.   BREAST EXCISIONAL BIOPSY Left 02/28/2021   neg   BREAST LUMPECTOMY Left 2014   DCIS with clear margins.    BTL     EXCISION OF BREAST LESION Left 07/29/2017   Procedure: EXCISION OF BREAST MASS;  Surgeon: Nadeen Landau, MD;  Location: [facility] ORS;  Service: General;  Laterality: Left;   HERNIA REPAIR     NASAL SINUS SURGERY      Family History  Problem Relation Age of Onset   Ovarian cancer Mother    Hypertension Mother    Diabetes Paternal Grandmother    Hypertension Paternal Grandmother    Breast cancer Paternal Aunt 91    Social History:  reports that she has never smoked. She has never used smokeless tobacco. She reports that she does not drink alcohol and does not use drugs.  She denies any exposure to radiation or toxins.  She works at [employer] full-time.  She lives in [place].  The patient is alone today.  Allergies:  Allergies  Allergen Reactions   Codeine Nausea And Vomiting   Gatifloxacin Hives    Current Medications: Current Outpatient Medications  Medication Sig Dispense Refill   aspirin EC 81 MG tablet Take 81 mg by mouth daily.      atorvastatin (LIPITOR) 10 MG tablet Take 10 mg by mouth every morning.     Cholecalciferol (VITAMIN D3) 50 MCG (2000 UT) capsule Take 2,000 Units by mouth daily.     Cyanocobalamin (VITAMIN B12 PO) Take 1 tablet by mouth daily.     dorzolamide-timolol (COSOPT) 22.3-6.8 MG/ML ophthalmic solution 1 drop 2 (two) times daily.     latanoprost (XALATAN) 0.005 % ophthalmic solution Place 1 drop into the left eye at bedtime.     losartan-hydrochlorothiazide (HYZAAR) 100-25 MG tablet Take 1  tablet by mouth daily.     Omeprazole 20 MG TBEC Take 20 mg by mouth every morning.     potassium chloride SA (KLOR-CON M) 20 MEQ tablet Take 1 tablet (20 mEq total) by mouth daily. 30 tablet 0   verapamil (CALAN-SR) 240 MG CR tablet Take 240 mg by mouth every morning.     tamoxifen (NOLVADEX) 20 MG tablet Take 1 tablet (20 mg total) by mouth daily. 90 tablet 1   No current facility-administered medications for this visit.    Review of Systems  Constitutional:  Negative for chills, fever, malaise/fatigue and weight loss.  HENT:  Negative for sore throat.   Eyes:  Negative for redness.  Respiratory:  Negative for cough, shortness of breath and wheezing.   Cardiovascular:  Negative for chest pain, palpitations and leg swelling.  Gastrointestinal:  Negative for abdominal pain, blood in stool, nausea and vomiting.  Genitourinary:  Negative for dysuria.  Musculoskeletal:  Negative for myalgias.  Skin:  Negative for rash.  Neurological:  Negative for dizziness, tingling and tremors.  Endo/Heme/Allergies:  Does not bruise/bleed easily.  Psychiatric/Behavioral:  Negative for hallucinations.    Performance status (ECOG): 1  Vitals Blood pressure (!) 143/72, pulse 65, temperature (!) 96.8 F (36 C), temperature source Tympanic, resp. rate 16, height 5\' 3"  (1.6 m), weight 182 lb 8 oz (82.8 kg), SpO2 100 %.   Physical Exam Constitutional:      General: She is not in acute distress.    Appearance: She is not diaphoretic.  HENT:     Head: Normocephalic and atraumatic.     Nose: Nose normal.     Mouth/Throat:     Pharynx: No oropharyngeal exudate.  Eyes:     General: No scleral icterus.    Pupils: Pupils are equal, round, and reactive to light.  Cardiovascular:     Rate and Rhythm: Normal rate and regular rhythm.     Heart sounds: No murmur heard. Pulmonary:     Effort: Pulmonary effort is normal. No respiratory distress.     Breath sounds: No rales.  Chest:     Chest wall: No  tenderness.  Abdominal:     General: There is no distension.     Palpations: Abdomen is soft.     Tenderness: There is no abdominal tenderness.  Musculoskeletal:        General: Normal range of motion.     Cervical back: Normal range of motion and neck supple.  Skin:    General: Skin is warm and dry.     Findings: No erythema.  Neurological:     Mental Status: She is alert and oriented to person, place, and time.     Cranial Nerves: No cranial nerve deficit.     Motor: No abnormal muscle tone.     Coordination: Coordination normal.  Psychiatric:        Mood and Affect: Affect normal.      Labs    Latest Ref Rng & Units 04/10/2023    1:11 PM 07/19/2022   11:10 AM 10/01/2021    4:00 AM  CBC  WBC 4.0 - 10.5 K/uL 7.3  6.6  7.3   Hemoglobin 12.0 - 15.0 g/dL 16.1  09.6  04.5   Hematocrit 36.0 - 46.0 % 34.3  36.9  31.6   Platelets 150 - 400 K/uL 295  329  271       Latest Ref Rng & Units 04/10/2023    1:11 PM 10/24/2022    9:05 AM 10/11/2022   12:58 PM  CMP  Glucose 70 - 99 mg/dL 409  811  914   BUN 8 - 23 mg/dL 15  20  15    Creatinine 0.44 - 1.00 mg/dL 7.82  9.56  2.13   Sodium 135 - 145 mmol/L 137  134  135   Potassium 3.5 - 5.1 mmol/L 3.6  3.7  2.9   Chloride 98 - 111 mmol/L 102  101  102   CO2 22 - 32 mmol/L 26  24  23    Calcium 8.9 - 10.3 mg/dL 9.4  8.9  8.8   Total Protein 6.5 - 8.1 g/dL 7.6     Total Bilirubin 0.3 - 1.2 mg/dL 0.3     Alkaline Phos 38 - 126 U/L 57     AST 15 - 41 U/L 17     ALT 0 - 44 U/L 9

## 2023-04-10 NOTE — Assessment & Plan Note (Addendum)
Patient has developed osteoporosis with a T-score -3.4.   Previously on AI, switch to Tamoxifen 20mg  daily, she tolerates well.  Recommend annual gynecology pelvic examination.

## 2023-04-21 ENCOUNTER — Encounter: Payer: Self-pay | Admitting: Oncology

## 2023-04-25 DIAGNOSIS — Z124 Encounter for screening for malignant neoplasm of cervix: Secondary | ICD-10-CM | POA: Diagnosis not present

## 2023-04-28 ENCOUNTER — Encounter: Payer: Self-pay | Admitting: Oncology

## 2023-07-21 ENCOUNTER — Ambulatory Visit: Payer: BC Managed Care – PPO | Admitting: Oncology

## 2023-07-21 ENCOUNTER — Other Ambulatory Visit: Payer: BC Managed Care – PPO

## 2023-09-01 DIAGNOSIS — E119 Type 2 diabetes mellitus without complications: Secondary | ICD-10-CM | POA: Diagnosis not present

## 2023-09-05 DIAGNOSIS — I10 Essential (primary) hypertension: Secondary | ICD-10-CM | POA: Diagnosis not present

## 2023-09-05 DIAGNOSIS — E7800 Pure hypercholesterolemia, unspecified: Secondary | ICD-10-CM | POA: Diagnosis not present

## 2023-09-05 DIAGNOSIS — E559 Vitamin D deficiency, unspecified: Secondary | ICD-10-CM | POA: Diagnosis not present

## 2023-09-05 DIAGNOSIS — E119 Type 2 diabetes mellitus without complications: Secondary | ICD-10-CM | POA: Diagnosis not present

## 2023-10-10 ENCOUNTER — Ambulatory Visit: Payer: BC Managed Care – PPO | Admitting: Oncology

## 2023-10-10 ENCOUNTER — Other Ambulatory Visit: Payer: BC Managed Care – PPO

## 2023-10-16 NOTE — Assessment & Plan Note (Addendum)
#   Left breast DCIS-status post lumpectomy in May 2014.   S/p radiation and previously finished 5 years of tamoxifen. annual mammogram screening.- due in March 2025

## 2023-10-16 NOTE — Assessment & Plan Note (Signed)
Patient has developed osteoporosis with a T-score -3.4.   Previously on AI, switch to Tamoxifen 20mg  daily, she tolerates well.  Recommend annual gynecology pelvic examination.

## 2023-10-17 ENCOUNTER — Inpatient Hospital Stay (HOSPITAL_BASED_OUTPATIENT_CLINIC_OR_DEPARTMENT_OTHER): Payer: Medicare HMO | Admitting: Oncology

## 2023-10-17 ENCOUNTER — Inpatient Hospital Stay: Payer: Medicare HMO | Attending: Oncology

## 2023-10-17 ENCOUNTER — Encounter: Payer: Self-pay | Admitting: Oncology

## 2023-10-17 VITALS — BP 148/88 | HR 76 | Temp 96.0°F | Resp 18 | Wt 180.1 lb

## 2023-10-17 DIAGNOSIS — M810 Age-related osteoporosis without current pathological fracture: Secondary | ICD-10-CM

## 2023-10-17 DIAGNOSIS — N6092 Unspecified benign mammary dysplasia of left breast: Secondary | ICD-10-CM | POA: Insufficient documentation

## 2023-10-17 DIAGNOSIS — Z79810 Long term (current) use of selective estrogen receptor modulators (SERMs): Secondary | ICD-10-CM | POA: Insufficient documentation

## 2023-10-17 DIAGNOSIS — D0512 Intraductal carcinoma in situ of left breast: Secondary | ICD-10-CM

## 2023-10-17 DIAGNOSIS — N6099 Unspecified benign mammary dysplasia of unspecified breast: Secondary | ICD-10-CM | POA: Diagnosis not present

## 2023-10-17 DIAGNOSIS — Z170 Estrogen receptor positive status [ER+]: Secondary | ICD-10-CM | POA: Insufficient documentation

## 2023-10-17 DIAGNOSIS — Z853 Personal history of malignant neoplasm of breast: Secondary | ICD-10-CM | POA: Diagnosis not present

## 2023-10-17 LAB — CMP (CANCER CENTER ONLY)
ALT: 14 U/L (ref 0–44)
AST: 22 U/L (ref 15–41)
Albumin: 3.9 g/dL (ref 3.5–5.0)
Alkaline Phosphatase: 53 U/L (ref 38–126)
Anion gap: 9 (ref 5–15)
BUN: 14 mg/dL (ref 8–23)
CO2: 26 mmol/L (ref 22–32)
Calcium: 9.5 mg/dL (ref 8.9–10.3)
Chloride: 102 mmol/L (ref 98–111)
Creatinine: 0.93 mg/dL (ref 0.44–1.00)
GFR, Estimated: >60 mL/min (ref >60)
Glucose, Bld: 109 mg/dL — ABNORMAL HIGH (ref 70–99)
Potassium: 3.7 mmol/L (ref 3.5–5.1)
Sodium: 137 mmol/L (ref 135–145)
Total Bilirubin: 0.8 mg/dL (ref 0.0–1.2)
Total Protein: 7.6 g/dL (ref 6.5–8.1)

## 2023-10-17 LAB — CBC WITH DIFFERENTIAL (CANCER CENTER ONLY)
Abs Immature Granulocytes: 0.04 10*3/uL (ref 0.00–0.07)
Basophils Absolute: 0.1 10*3/uL (ref 0.0–0.1)
Basophils Relative: 1 %
Eosinophils Absolute: 0.1 10*3/uL (ref 0.0–0.5)
Eosinophils Relative: 1 %
HCT: 34.6 % — ABNORMAL LOW (ref 36.0–46.0)
Hemoglobin: 11.6 g/dL — ABNORMAL LOW (ref 12.0–15.0)
Immature Granulocytes: 0 %
Lymphocytes Relative: 30 %
Lymphs Abs: 3 10*3/uL (ref 0.7–4.0)
MCH: 28.5 pg (ref 26.0–34.0)
MCHC: 33.5 g/dL (ref 30.0–36.0)
MCV: 85 fL (ref 80.0–100.0)
Monocytes Absolute: 0.7 10*3/uL (ref 0.1–1.0)
Monocytes Relative: 7 %
Neutro Abs: 6 10*3/uL (ref 1.7–7.7)
Neutrophils Relative %: 61 %
Platelet Count: 319 10*3/uL (ref 150–400)
RBC: 4.07 MIL/uL (ref 3.87–5.11)
RDW: 16.1 % — ABNORMAL HIGH (ref 11.5–15.5)
WBC Count: 9.9 10*3/uL (ref 4.0–10.5)
nRBC: 0 % (ref 0.0–0.2)

## 2023-10-17 MED ORDER — TAMOXIFEN CITRATE 20 MG PO TABS: 20.0000 mg | ORAL_TABLET | Freq: Every day | ORAL | 1 refills | Status: DC

## 2023-10-17 NOTE — Assessment & Plan Note (Signed)
09/23/2022 DEXA showed  Osteoporosis T-score -3.4  Recommend calcium and vitamin D supplementation. Zometa 3.5mg  Q6 months.

## 2023-10-17 NOTE — Progress Notes (Signed)
 Hematology/Oncology Progress note [phone] [phone]      Clinic Day:  10/17/2023 ASSESSMENT & PLAN:   Atypical ductal hyperplasia, breast Patient has developed osteoporosis with a T-score -3.4.   Previously on AI, switch to Tamoxifen  20mg  daily, she tolerates well.  Recommend annual gynecology pelvic examination.   Ductal carcinoma in situ (DCIS) of left breast # Left breast DCIS-status post lumpectomy in May 2014.   S/p radiation and previously finished 5 years of tamoxifen . annual mammogram screening.- due in March 2025  Osteoporosis 09/23/2022 DEXA showed  Osteoporosis T-score -3.4  Recommend calcium  and vitamin D  supplementation. Zometa  3.5mg  Q6 months.  Orders Placed This Encounter  Procedures   MM 3D SCREENING MAMMOGRAM BILATERAL BREAST    Standing Status:   Future    Expected Date:   12/17/2023    Expiration Date:   10/16/2024    Reason for Exam (SYMPTOM  OR DIAGNOSIS REQUIRED):   Breast cancer    Preferred imaging location?:   [facility]   CBC with Differential (Cancer Center Only)    Standing Status:   Future    Expected Date:   04/15/2024    Expiration Date:   10/16/2024   CMP (Cancer Center only)    Standing Status:   Future    Expected Date:   04/15/2024    Expiration Date:   10/16/2024   Follow up in 6 months All questions were answered. The patient knows to call the clinic with any problems, questions or concerns.  Zelphia Cap, MD, PhD [facility] Hematology Oncology 10/17/2023    Chief Complaint: Katie Thompson is a 78 y.o. female presents for follow-up of left breast ADH and left breast DCIS   PERTINENT ONCOLOGY HISTORY Patient previously followed up by Dr.Corcoran, patient switched care to me on 06/07/21 Extensive medical record review was performed by me  02/15/2013 left breast DCIS status postlumpectomy. Pathology revealed a 16 mm area of grade II residual ductal carcinoma in situ, cribriform type.  Margins were clear. DCIS was  ER 75%, and PR 25%.  Pathologic stage was pTis pNx.  She received accelerated partial breast irradiation, 3400 cGy in 10 fractions at 340 cGy twice a day.  She received tamoxifen  from 03/2013 - 06/2018.   Osteoporosis-patient has bone density done through [facility]. 02/16/2021 DEXA osteopenia 10-year risk of hip fracture 1.3, 10-year risk of any major fracture 5.6.  01/01/2021, bilateral breast screening mammogram showed possible asymmetry in the left breast. 01/18/2021, left diagnostic mammogram showed indeterminate mass in the upper inner quadrant of the left breast. 01/29/2021, left breast mass biopsy showed intraductal papilloma with sclerosing adenosis and associated calcifications.  Negative for malignancy. 02/28/2021, excision of left breast mass showed focal atypical ductal hyperplasia.  Background fibrocystic and apocrine changes with focal usual ductal hyperplasia.  Negative for residual intraductal papilloma. Negative for ductal carcinoma in situ and malignancy.  06/07/2021, started on Arimidex  1 mg daily. 10/03/2022, switched to tamoxifen  due to osteoporosis. 12/18/22 bilateral screening mammogram showed no malignancy.    INTERVAL HISTORY Katie Thompson is a 78 y.o. female who has above history reviewed by me today presents for follow up visit for management of history of left DCIS and left breast ADH Today patient reports feeling well.  No new complaints. She tolerate Tamoxifen  20mg  daily.  No new complains.    Past Medical History:  Diagnosis Date   Arthritis    Breast cancer (HCC) 2014   LT with radiation   Diabetes  mellitus without complication (HCC)    diet controlled no meds   Diverticulosis feb. 2015   from colonoscopy   GERD (gastroesophageal reflux disease)    History of hiatal hernia    Hyperlipidemia    Hypertension    Personal history of radiation therapy 2014   DCIS left breast    Past Surgical History:  Procedure Laterality Date   APPENDECTOMY      BREAST BIOPSY Left 2016   coil marker, BENIGN BREAST TISSUE WITH CLUSTERED MICROCYSTS AND STROMAL FIBROSIS.    BREAST BIOPSY Left 01/05/2018   Affirm Bx- x marker, FIBROCYSTIC CHANGE WITH MICROCALCIFICATIONS   BREAST BIOPSY Left 02/04/2013   DCIS   BREAST BIOPSY Left 01/29/2021   Stereo Bx, X-Clip, INTRADUCTAL PAPILLOMA WITH SCLEROSING ADENOSIS    BREAST BIOPSY WITH RADIO FREQUENCY LOCALIZER Left 02/28/2021   Procedure: BREAST BIOPSY WITH RADIO FREQUENCY LOCALIZER;  Surgeon: Rodolph Romano, MD;  Location: [facility] ORS;  Service: General;  Laterality: Left;   BREAST EXCISIONAL BIOPSY Left 07/29/2017   SCLEROTIC INTRADUCTAL PAPILLOMA.   BREAST EXCISIONAL BIOPSY Left 02/28/2021   neg   BREAST LUMPECTOMY Left 2014   DCIS with clear margins.    BTL     EXCISION OF BREAST LESION Left 07/29/2017   Procedure: EXCISION OF BREAST MASS;  Surgeon: Claudene Larinda Bolder, MD;  Location: [facility] ORS;  Service: General;  Laterality: Left;   HERNIA REPAIR     NASAL SINUS SURGERY      Family History  Problem Relation Age of Onset   Ovarian cancer Mother    Hypertension Mother    Diabetes Paternal Grandmother    Hypertension Paternal Grandmother    Breast cancer Paternal Aunt 22    Social History:  reports that she has never smoked. She has never used smokeless tobacco. She reports that she does not drink alcohol  and does not use drugs.  She denies any exposure to radiation or toxins.  She works at [employer] full-time.  She lives in [place].  The patient is alone today.  Allergies:  Allergies  Allergen Reactions   Codeine Nausea And Vomiting   Gatifloxacin Hives    Current Medications: Current Outpatient Medications  Medication Sig Dispense Refill   aspirin EC 81 MG tablet Take 81 mg by mouth daily.      atorvastatin  (LIPITOR) 10 MG tablet Take 10 mg by mouth every morning.     Cholecalciferol  (VITAMIN D3) 50 MCG (2000 UT) capsule Take 2,000 Units by mouth daily.     Cyanocobalamin   (VITAMIN B12 PO) Take 1 tablet by mouth daily.     dorzolamide -timolol  (COSOPT ) 22.3-6.8 MG/ML ophthalmic solution 1 drop 2 (two) times daily.     latanoprost  (XALATAN ) 0.005 % ophthalmic solution Place 1 drop into the left eye at bedtime.     losartan -hydrochlorothiazide  (HYZAAR) 100-25 MG tablet Take 1 tablet by mouth daily.     Omeprazole 20 MG TBEC Take 20 mg by mouth every morning.     potassium chloride  SA (KLOR-CON  M) 20 MEQ tablet Take 1 tablet (20 mEq total) by mouth daily. 30 tablet 0   verapamil  (CALAN -SR) 240 MG CR tablet Take 240 mg by mouth every morning.     tamoxifen  (NOLVADEX ) 20 MG tablet Take 1 tablet (20 mg total) by mouth daily. 90 tablet 1   No current facility-administered medications for this visit.    Review of Systems  Constitutional:  Negative for chills, fever, malaise/fatigue and weight loss.  HENT:  Negative for sore throat.  Eyes:  Negative for redness.  Respiratory:  Negative for cough, shortness of breath and wheezing.   Cardiovascular:  Negative for chest pain, palpitations and leg swelling.  Gastrointestinal:  Negative for abdominal pain, blood in stool, nausea and vomiting.  Genitourinary:  Negative for dysuria.  Musculoskeletal:  Negative for myalgias.  Skin:  Negative for rash.  Neurological:  Negative for dizziness, tingling and tremors.  Endo/Heme/Allergies:  Does not bruise/bleed easily.  Psychiatric/Behavioral:  Negative for hallucinations.    Performance status (ECOG): 1  Vitals Blood pressure (!) 148/88, pulse 76, temperature (!) 96 F (35.6 C), temperature source Tympanic, resp. rate 18, weight 180 lb 1.6 oz (81.7 kg), SpO2 98%.   Physical Exam Constitutional:      General: She is not in acute distress.    Appearance: She is not diaphoretic.  HENT:     Head: Normocephalic and atraumatic.     Nose: Nose normal.     Mouth/Throat:     Pharynx: No oropharyngeal exudate.  Eyes:     General: No scleral icterus.    Pupils: Pupils are  equal, round, and reactive to light.  Cardiovascular:     Rate and Rhythm: Normal rate and regular rhythm.     Heart sounds: No murmur heard. Pulmonary:     Effort: Pulmonary effort is normal. No respiratory distress.     Breath sounds: No rales.  Chest:     Chest wall: No tenderness.  Abdominal:     General: There is no distension.     Palpations: Abdomen is soft.     Tenderness: There is no abdominal tenderness.  Musculoskeletal:        General: Normal range of motion.     Cervical back: Normal range of motion and neck supple.  Skin:    General: Skin is warm and dry.     Findings: No erythema.  Neurological:     Mental Status: She is alert and oriented to person, place, and time.     Cranial Nerves: No cranial nerve deficit.     Motor: No abnormal muscle tone.     Coordination: Coordination normal.  Psychiatric:        Mood and Affect: Affect normal.      Labs    Latest Ref Rng & Units 10/17/2023    9:42 AM 04/10/2023    1:11 PM 07/19/2022   11:10 AM  CBC  WBC 4.0 - 10.5 K/uL 9.9  7.3  6.6   Hemoglobin 12.0 - 15.0 g/dL 88.3  88.4  87.8   Hematocrit 36.0 - 46.0 % 34.6  34.3  36.9   Platelets 150 - 400 K/uL 319  295  329       Latest Ref Rng & Units 10/17/2023    9:42 AM 04/10/2023    1:11 PM 10/24/2022    9:05 AM  CMP  Glucose 70 - 99 mg/dL 890  867  872   BUN 8 - 23 mg/dL 14  15  20    Creatinine 0.44 - 1.00 mg/dL 9.06  8.96  8.91   Sodium 135 - 145 mmol/L 137  137  134   Potassium 3.5 - 5.1 mmol/L 3.7  3.6  3.7   Chloride 98 - 111 mmol/L 102  102  101   CO2 22 - 32 mmol/L 26  26  24    Calcium  8.9 - 10.3 mg/dL 9.5  9.4  8.9   Total Protein 6.5 - 8.1 g/dL 7.6  7.6  Total Bilirubin 0.0 - 1.2 mg/dL 0.8  0.3    Alkaline Phos 38 - 126 U/L 53  57    AST 15 - 41 U/L 22  17    ALT 0 - 44 U/L 14  9

## 2023-10-20 ENCOUNTER — Telehealth: Payer: Self-pay

## 2023-10-20 DIAGNOSIS — E119 Type 2 diabetes mellitus without complications: Secondary | ICD-10-CM | POA: Diagnosis not present

## 2023-10-20 DIAGNOSIS — H2512 Age-related nuclear cataract, left eye: Secondary | ICD-10-CM | POA: Diagnosis not present

## 2023-10-20 DIAGNOSIS — Z0100 Encounter for examination of eyes and vision without abnormal findings: Secondary | ICD-10-CM | POA: Diagnosis not present

## 2023-10-20 DIAGNOSIS — H401132 Primary open-angle glaucoma, bilateral, moderate stage: Secondary | ICD-10-CM | POA: Diagnosis not present

## 2023-10-20 NOTE — Telephone Encounter (Signed)
 Katie Thompson will you please schedule and notify patient of dates and times.

## 2023-10-20 NOTE — Telephone Encounter (Signed)
-----   Message from Rickard Patience sent at 10/17/2023  5:37 PM EST ----- Please arrange patient to get Zometa treatment.  Also add zometa to next visit and adjust next visit date to be 6 months after her zometa date

## 2024-01-07 ENCOUNTER — Ambulatory Visit
Admission: RE | Admit: 2024-01-07 | Discharge: 2024-01-07 | Disposition: A | Discharge status: Home / Self Care | Payer: Medicare HMO | Source: Ambulatory Visit | Attending: Oncology | Admitting: Oncology

## 2024-01-07 DIAGNOSIS — Z1231 Encounter for screening mammogram for malignant neoplasm of breast: Secondary | ICD-10-CM | POA: Diagnosis not present

## 2024-01-07 DIAGNOSIS — N6099 Unspecified benign mammary dysplasia of unspecified breast: Secondary | ICD-10-CM

## 2024-02-27 DIAGNOSIS — E119 Type 2 diabetes mellitus without complications: Secondary | ICD-10-CM | POA: Diagnosis not present

## 2024-03-05 DIAGNOSIS — E7800 Pure hypercholesterolemia, unspecified: Secondary | ICD-10-CM | POA: Diagnosis not present

## 2024-03-05 DIAGNOSIS — Z0000 Encounter for general adult medical examination without abnormal findings: Secondary | ICD-10-CM | POA: Diagnosis not present

## 2024-03-05 DIAGNOSIS — E119 Type 2 diabetes mellitus without complications: Secondary | ICD-10-CM | POA: Diagnosis not present

## 2024-03-05 DIAGNOSIS — D649 Anemia, unspecified: Secondary | ICD-10-CM | POA: Diagnosis not present

## 2024-03-05 DIAGNOSIS — Z0001 Encounter for general adult medical examination with abnormal findings: Secondary | ICD-10-CM | POA: Diagnosis not present

## 2024-03-05 DIAGNOSIS — I10 Essential (primary) hypertension: Secondary | ICD-10-CM | POA: Diagnosis not present

## 2024-03-05 DIAGNOSIS — Z1331 Encounter for screening for depression: Secondary | ICD-10-CM | POA: Diagnosis not present

## 2024-04-15 ENCOUNTER — Inpatient Hospital Stay: Payer: Medicare HMO | Attending: Oncology

## 2024-04-15 ENCOUNTER — Encounter: Payer: Self-pay | Admitting: Oncology

## 2024-04-15 ENCOUNTER — Inpatient Hospital Stay: Payer: Medicare HMO

## 2024-04-15 ENCOUNTER — Inpatient Hospital Stay (HOSPITAL_BASED_OUTPATIENT_CLINIC_OR_DEPARTMENT_OTHER): Payer: Medicare HMO | Admitting: Oncology

## 2024-04-15 VITALS — BP 130/78 | HR 87 | Temp 97.8°F | Resp 18 | Wt 175.2 lb

## 2024-04-15 DIAGNOSIS — Z08 Encounter for follow-up examination after completed treatment for malignant neoplasm: Secondary | ICD-10-CM | POA: Diagnosis not present

## 2024-04-15 DIAGNOSIS — N6099 Unspecified benign mammary dysplasia of unspecified breast: Secondary | ICD-10-CM | POA: Diagnosis not present

## 2024-04-15 DIAGNOSIS — D0512 Intraductal carcinoma in situ of left breast: Secondary | ICD-10-CM

## 2024-04-15 DIAGNOSIS — M810 Age-related osteoporosis without current pathological fracture: Secondary | ICD-10-CM | POA: Insufficient documentation

## 2024-04-15 DIAGNOSIS — N6022 Fibroadenosis of left breast: Secondary | ICD-10-CM | POA: Insufficient documentation

## 2024-04-15 DIAGNOSIS — Z853 Personal history of malignant neoplasm of breast: Secondary | ICD-10-CM | POA: Diagnosis not present

## 2024-04-15 LAB — CBC WITH DIFFERENTIAL (CANCER CENTER ONLY)
Abs Immature Granulocytes: 0.04 10*3/uL (ref 0.00–0.07)
Basophils Absolute: 0.1 10*3/uL (ref 0.0–0.1)
Basophils Relative: 1 %
Eosinophils Absolute: 0.1 10*3/uL (ref 0.0–0.5)
Eosinophils Relative: 1 %
HCT: 33.2 % — ABNORMAL LOW (ref 36.0–46.0)
Hemoglobin: 11.2 g/dL — ABNORMAL LOW (ref 12.0–15.0)
Immature Granulocytes: 0 %
Lymphocytes Relative: 34 %
Lymphs Abs: 3.3 10*3/uL (ref 0.7–4.0)
MCH: 28.4 pg (ref 26.0–34.0)
MCHC: 33.7 g/dL (ref 30.0–36.0)
MCV: 84.3 fL (ref 80.0–100.0)
Monocytes Absolute: 0.6 10*3/uL (ref 0.1–1.0)
Monocytes Relative: 7 %
Neutro Abs: 5.6 10*3/uL (ref 1.7–7.7)
Neutrophils Relative %: 57 %
Platelet Count: 335 10*3/uL (ref 150–400)
RBC: 3.94 MIL/uL (ref 3.87–5.11)
RDW: 15.7 % — ABNORMAL HIGH (ref 11.5–15.5)
WBC Count: 9.7 10*3/uL (ref 4.0–10.5)
nRBC: 0 % (ref 0.0–0.2)

## 2024-04-15 LAB — CMP (CANCER CENTER ONLY)
ALT: 16 U/L (ref 0–44)
AST: 23 U/L (ref 15–41)
Albumin: 3.7 g/dL (ref 3.5–5.0)
Alkaline Phosphatase: 64 U/L (ref 38–126)
Anion gap: 7 (ref 5–15)
BUN: 17 mg/dL (ref 8–23)
CO2: 27 mmol/L (ref 22–32)
Calcium: 8.8 mg/dL — ABNORMAL LOW (ref 8.9–10.3)
Chloride: 102 mmol/L (ref 98–111)
Creatinine: 1.13 mg/dL — ABNORMAL HIGH (ref 0.44–1.00)
GFR, Estimated: 49 mL/min — ABNORMAL LOW (ref >60)
Glucose, Bld: 118 mg/dL — ABNORMAL HIGH (ref 70–99)
Potassium: 3.3 mmol/L — ABNORMAL LOW (ref 3.5–5.1)
Sodium: 136 mmol/L (ref 135–145)
Total Bilirubin: 0.7 mg/dL (ref 0.0–1.2)
Total Protein: 7.4 g/dL (ref 6.5–8.1)

## 2024-04-15 MED ORDER — ZOLEDRONIC ACID 4 MG/5ML IV CONC
3.5000 mg | Freq: Once | INTRAVENOUS | Status: DC
  Filled 2024-04-15: qty 4.38

## 2024-04-15 MED ORDER — VITAMIN D3 50 MCG (2000 UT) PO CAPS: 2000.0000 [IU] | ORAL_CAPSULE | Freq: Every day | ORAL | Status: AC

## 2024-04-15 MED ORDER — SODIUM CHLORIDE 0.9 % IV SOLN
Freq: Once | INTRAVENOUS | Status: DC
  Filled 2024-04-15: qty 250

## 2024-04-15 MED ORDER — TAMOXIFEN CITRATE 20 MG PO TABS: 20.0000 mg | ORAL_TABLET | Freq: Every day | ORAL | 1 refills | Status: DC

## 2024-04-15 MED ORDER — CALCIUM CARBONATE 600 MG PO TABS: 600.0000 mg | ORAL_TABLET | Freq: Two times a day (BID) | ORAL | 11 refills | Status: AC

## 2024-04-15 NOTE — Progress Notes (Signed)
 Pt here for follow up. Requesting refill on Tamoxifen .

## 2024-04-15 NOTE — Progress Notes (Signed)
 Hematology/Oncology Progress note [phone] [phone]      Clinic Day:  04/15/2024 ASSESSMENT & PLAN:   Atypical ductal hyperplasia, breast Patient has developed osteoporosis with a T-score -3.4.   Previously on AI, switch to Tamoxifen  20mg  daily, she tolerates well.  Continue current regimen. Recommend annual gynecology pelvic examination.   Osteoporosis 09/23/2022 DEXA showed  Osteoporosis T-score -3.4  Recommend calcium  and vitamin D  supplementation.  Repeat DEXA in December Zometa  3.5mg  Q6 months.  Ductal carcinoma in situ (DCIS) of left breast # Left breast DCIS-status post lumpectomy in May 2014.   S/p radiation and previously finished 5 years of tamoxifen . annual mammogram screening.- due in March 2026  Orders Placed This Encounter  Procedures   DG Bone Density    Standing Status:   Future    Expected Date:   09/21/2024    Expiration Date:   04/15/2025    Reason for Exam (SYMPTOM  OR DIAGNOSIS REQUIRED):   ADH of breast    Preferred imaging location?:   [facility]   CMP (Cancer Center only)    Standing Status:   Future    Expected Date:   10/16/2024    Expiration Date:   01/14/2025   CBC with Differential (Cancer Center Only)    Standing Status:   Future    Expected Date:   10/16/2024    Expiration Date:   01/14/2025   Follow up in 6 months All questions were answered. The patient knows to call the clinic with any problems, questions or concerns.  Zelphia Cap, MD, PhD [facility] Hematology Oncology 04/15/2024    Chief Complaint: Katie Thompson is a 78 y.o. female presents for follow-up of left breast ADH and left breast DCIS   PERTINENT ONCOLOGY HISTORY Patient previously followed up by Dr.Corcoran, patient switched care to me on 06/07/21 Extensive medical record review was performed by me  02/15/2013 left breast DCIS status postlumpectomy. Pathology revealed a 16 mm area of grade II residual ductal carcinoma in situ, cribriform type.   Margins were clear. DCIS was ER 75%, and PR 25%.  Pathologic stage was pTis pNx.  She received accelerated partial breast irradiation, 3400 cGy in 10 fractions at 340 cGy twice a day.  She received tamoxifen  from 03/2013 - 06/2018.   Osteoporosis-patient has bone density done through [facility] health system. 02/16/2021 DEXA osteopenia 10-year risk of hip fracture 1.3, 10-year risk of any major fracture 5.6.  01/01/2021, bilateral breast screening mammogram showed possible asymmetry in the left breast. 01/18/2021, left diagnostic mammogram showed indeterminate mass in the upper inner quadrant of the left breast. 01/29/2021, left breast mass biopsy showed intraductal papilloma with sclerosing adenosis and associated calcifications.  Negative for malignancy. 02/28/2021, excision of left breast mass showed focal atypical ductal hyperplasia.  Background fibrocystic and apocrine changes with focal usual ductal hyperplasia.  Negative for residual intraductal papilloma. Negative for ductal carcinoma in situ and malignancy.  06/07/2021, started on Arimidex  1 mg daily. 10/03/2022, switched to tamoxifen  due to osteoporosis. 12/18/22 bilateral screening mammogram showed no malignancy.    INTERVAL HISTORY Katie Thompson is a 78 y.o. female who has above history reviewed by me today presents for follow up visit for management of history of left DCIS and left breast ADH Today patient reports feeling well.  No new complaints. She tolerate Tamoxifen  20mg  daily.  No new complains.    Past Medical History:  Diagnosis Date   Arthritis    Breast cancer (HCC) 2014  LT with radiation   Diabetes mellitus without complication (HCC)    diet controlled no meds   Diverticulosis feb. 2015   from colonoscopy   GERD (gastroesophageal reflux disease)    History of hiatal hernia    Hyperlipidemia    Hypertension    Personal history of radiation therapy 2014   DCIS left breast    Past Surgical History:  Procedure  Laterality Date   APPENDECTOMY     BREAST BIOPSY Left 2016   coil marker, BENIGN BREAST TISSUE WITH CLUSTERED MICROCYSTS AND STROMAL FIBROSIS.    BREAST BIOPSY Left 01/05/2018   Affirm Bx- x marker, FIBROCYSTIC CHANGE WITH MICROCALCIFICATIONS   BREAST BIOPSY Left 02/04/2013   DCIS   BREAST BIOPSY Left 01/29/2021   Stereo Bx, X-Clip, INTRADUCTAL PAPILLOMA WITH SCLEROSING ADENOSIS    BREAST BIOPSY WITH RADIO FREQUENCY LOCALIZER Left 02/28/2021   Procedure: BREAST BIOPSY WITH RADIO FREQUENCY LOCALIZER;  Surgeon: Rodolph Romano, MD;  Location: [facility] ORS;  Service: General;  Laterality: Left;   BREAST EXCISIONAL BIOPSY Left 07/29/2017   SCLEROTIC INTRADUCTAL PAPILLOMA.   BREAST EXCISIONAL BIOPSY Left 02/28/2021   neg   BREAST LUMPECTOMY Left 2014   DCIS with clear margins.    BTL     EXCISION OF BREAST LESION Left 07/29/2017   Procedure: EXCISION OF BREAST MASS;  Surgeon: Claudene Larinda Bolder, MD;  Location: [facility] ORS;  Service: General;  Laterality: Left;   HERNIA REPAIR     NASAL SINUS SURGERY      Family History  Problem Relation Age of Onset   Ovarian cancer Mother    Hypertension Mother    Diabetes Paternal Grandmother    Hypertension Paternal Grandmother    Breast cancer Paternal Aunt 36    Social History:  reports that she has never smoked. She has never used smokeless tobacco. She reports that she does not drink alcohol  and does not use drugs.  She denies any exposure to radiation or toxins.  She works at [employer] full-time.  She lives in [place].  The patient is alone today.  Allergies:  Allergies  Allergen Reactions   Codeine Nausea And Vomiting   Gatifloxacin Hives    Current Medications: Current Outpatient Medications  Medication Sig Dispense Refill   aspirin EC 81 MG tablet Take 81 mg by mouth daily.      atorvastatin  (LIPITOR) 10 MG tablet Take 10 mg by mouth every morning.     calcium  carbonate (OS-CAL) 600 MG TABS tablet Take 1 tablet (600 mg total)  by mouth 2 (two) times daily with a meal. 60 tablet 11   Cyanocobalamin (VITAMIN B12 PO) Take 1 tablet by mouth daily.     dorzolamide -timolol  (COSOPT ) 22.3-6.8 MG/ML ophthalmic solution 1 drop 2 (two) times daily.     latanoprost  (XALATAN ) 0.005 % ophthalmic solution Place 1 drop into the left eye at bedtime.     losartan -hydrochlorothiazide  (HYZAAR) 100-25 MG tablet Take 1 tablet by mouth daily.     Omeprazole 20 MG TBEC Take 20 mg by mouth every morning.     potassium chloride  SA (KLOR-CON  M) 20 MEQ tablet Take 1 tablet (20 mEq total) by mouth daily. 30 tablet 0   verapamil  (CALAN -SR) 240 MG CR tablet Take 240 mg by mouth every morning.     Cholecalciferol  (VITAMIN D3) 50 MCG (2000 UT) capsule Take 1 capsule (2,000 Units total) by mouth daily.     tamoxifen  (NOLVADEX ) 20 MG tablet Take 1 tablet (20 mg total) by mouth daily.  90 tablet 1   No current facility-administered medications for this visit.    Review of Systems  Constitutional:  Negative for chills, fever, malaise/fatigue and weight loss.  HENT:  Negative for sore throat.   Eyes:  Negative for redness.  Respiratory:  Negative for cough, shortness of breath and wheezing.   Cardiovascular:  Negative for chest pain, palpitations and leg swelling.  Gastrointestinal:  Negative for abdominal pain, blood in stool, nausea and vomiting.  Genitourinary:  Negative for dysuria.  Musculoskeletal:  Negative for myalgias.  Skin:  Negative for rash.  Neurological:  Negative for dizziness, tingling and tremors.  Endo/Heme/Allergies:  Does not bruise/bleed easily.  Psychiatric/Behavioral:  Negative for hallucinations.    Performance status (ECOG): 1  Vitals Blood pressure 130/78, pulse 87, temperature 97.8 F (36.6 C), resp. rate 18, weight 175 lb 3.2 oz (79.5 kg).   Physical Exam Constitutional:      General: She is not in acute distress.    Appearance: She is not diaphoretic.  HENT:     Head: Normocephalic and atraumatic.     Nose:  Nose normal.     Mouth/Throat:     Pharynx: No oropharyngeal exudate.  Eyes:     General: No scleral icterus.    Pupils: Pupils are equal, round, and reactive to light.  Cardiovascular:     Rate and Rhythm: Normal rate and regular rhythm.     Heart sounds: No murmur heard. Pulmonary:     Effort: Pulmonary effort is normal. No respiratory distress.     Breath sounds: No rales.  Chest:     Chest wall: No tenderness.  Abdominal:     General: There is no distension.     Palpations: Abdomen is soft.     Tenderness: There is no abdominal tenderness.  Musculoskeletal:        General: Normal range of motion.     Cervical back: Normal range of motion and neck supple.  Skin:    General: Skin is warm and dry.     Findings: No erythema.  Neurological:     Mental Status: She is alert and oriented to person, place, and time.     Cranial Nerves: No cranial nerve deficit.     Motor: No abnormal muscle tone.     Coordination: Coordination normal.  Psychiatric:        Mood and Affect: Affect normal.      Labs    Latest Ref Rng & Units 04/15/2024    9:33 AM 10/17/2023    9:42 AM 04/10/2023    1:11 PM  CBC  WBC 4.0 - 10.5 K/uL 9.7  9.9  7.3   Hemoglobin 12.0 - 15.0 g/dL 88.7  88.3  88.4   Hematocrit 36.0 - 46.0 % 33.2  34.6  34.3   Platelets 150 - 400 K/uL 335  319  295       Latest Ref Rng & Units 04/15/2024    9:33 AM 10/17/2023    9:42 AM 04/10/2023    1:11 PM  CMP  Glucose 70 - 99 mg/dL 881  890  867   BUN 8 - 23 mg/dL 17  14  15    Creatinine 0.44 - 1.00 mg/dL 8.86  9.06  8.96   Sodium 135 - 145 mmol/L 136  137  137   Potassium 3.5 - 5.1 mmol/L 3.3  3.7  3.6   Chloride 98 - 111 mmol/L 102  102  102   CO2 22 -  32 mmol/L 27  26  26    Calcium  8.9 - 10.3 mg/dL 8.8  9.5  9.4   Total Protein 6.5 - 8.1 g/dL 7.4  7.6  7.6   Total Bilirubin 0.0 - 1.2 mg/dL 0.7  0.8  0.3   Alkaline Phos 38 - 126 U/L 64  53  57   AST 15 - 41 U/L 23  22  17    ALT 0 - 44 U/L 16  14  9

## 2024-04-15 NOTE — Assessment & Plan Note (Addendum)
 09/23/2022 DEXA showed  Osteoporosis T-score -3.4  Recommend calcium  and vitamin D  supplementation.  Repeat DEXA in December Zometa  3.5mg  Q6 months.

## 2024-04-15 NOTE — Progress Notes (Signed)
 Pt was not seen in infusion today for zometa . She had already left the building. She will be contacted by scheduling to reschedule her infusion.

## 2024-04-15 NOTE — Assessment & Plan Note (Addendum)
 Patient has developed osteoporosis with a T-score -3.4.   Previously on AI, switch to Tamoxifen  20mg  daily, she tolerates well.  Continue current regimen. Recommend annual gynecology pelvic examination.

## 2024-04-15 NOTE — Assessment & Plan Note (Addendum)
#   Left breast DCIS-status post lumpectomy in May 2014.   S/p radiation and previously finished 5 years of tamoxifen . annual mammogram screening.- due in March 2026

## 2024-04-19 ENCOUNTER — Inpatient Hospital Stay

## 2024-04-19 VITALS — BP 118/64 | HR 45 | Temp 97.5°F | Resp 18

## 2024-04-19 DIAGNOSIS — M810 Age-related osteoporosis without current pathological fracture: Secondary | ICD-10-CM | POA: Diagnosis not present

## 2024-04-19 DIAGNOSIS — Z853 Personal history of malignant neoplasm of breast: Secondary | ICD-10-CM | POA: Diagnosis not present

## 2024-04-19 DIAGNOSIS — Z08 Encounter for follow-up examination after completed treatment for malignant neoplasm: Secondary | ICD-10-CM | POA: Diagnosis not present

## 2024-04-19 DIAGNOSIS — N6022 Fibroadenosis of left breast: Secondary | ICD-10-CM | POA: Diagnosis not present

## 2024-04-19 MED ORDER — ZOLEDRONIC ACID 4 MG/5ML IV CONC
3.5000 mg | Freq: Once | INTRAVENOUS | Status: AC
  Administered 2024-04-19: 3.5 mg via INTRAVENOUS
  Filled 2024-04-19: qty 4.38

## 2024-04-19 MED ORDER — SODIUM CHLORIDE 0.9 % IV SOLN
Freq: Once | INTRAVENOUS | Status: AC
  Filled 2024-04-19: qty 250

## 2024-04-22 DIAGNOSIS — H40003 Preglaucoma, unspecified, bilateral: Secondary | ICD-10-CM | POA: Diagnosis not present

## 2024-04-26 DIAGNOSIS — Z1331 Encounter for screening for depression: Secondary | ICD-10-CM | POA: Diagnosis not present

## 2024-04-26 DIAGNOSIS — Z853 Personal history of malignant neoplasm of breast: Secondary | ICD-10-CM | POA: Diagnosis not present

## 2024-04-26 DIAGNOSIS — Z01419 Encounter for gynecological examination (general) (routine) without abnormal findings: Secondary | ICD-10-CM | POA: Diagnosis not present

## 2024-04-27 DIAGNOSIS — Z961 Presence of intraocular lens: Secondary | ICD-10-CM | POA: Diagnosis not present

## 2024-04-27 DIAGNOSIS — H401111 Primary open-angle glaucoma, right eye, mild stage: Secondary | ICD-10-CM | POA: Diagnosis not present

## 2024-04-27 DIAGNOSIS — H401122 Primary open-angle glaucoma, left eye, moderate stage: Secondary | ICD-10-CM | POA: Diagnosis not present

## 2024-06-18 DIAGNOSIS — M8588 Other specified disorders of bone density and structure, other site: Secondary | ICD-10-CM | POA: Diagnosis not present

## 2024-08-06 ENCOUNTER — Emergency Department

## 2024-08-06 ENCOUNTER — Other Ambulatory Visit: Payer: Self-pay

## 2024-08-06 ENCOUNTER — Inpatient Hospital Stay
Admission: EM | Admit: 2024-08-06 | Discharge: 2024-08-16 | DRG: 389 | Disposition: A | Discharge status: Home Health | Attending: Internal Medicine | Admitting: Internal Medicine

## 2024-08-06 DIAGNOSIS — D259 Leiomyoma of uterus, unspecified: Secondary | ICD-10-CM | POA: Diagnosis not present

## 2024-08-06 DIAGNOSIS — J36 Peritonsillar abscess: Secondary | ICD-10-CM | POA: Diagnosis not present

## 2024-08-06 DIAGNOSIS — E860 Dehydration: Secondary | ICD-10-CM | POA: Diagnosis present

## 2024-08-06 DIAGNOSIS — Z853 Personal history of malignant neoplasm of breast: Secondary | ICD-10-CM

## 2024-08-06 DIAGNOSIS — E119 Type 2 diabetes mellitus without complications: Secondary | ICD-10-CM | POA: Diagnosis present

## 2024-08-06 DIAGNOSIS — Z7982 Long term (current) use of aspirin: Secondary | ICD-10-CM

## 2024-08-06 DIAGNOSIS — E042 Nontoxic multinodular goiter: Secondary | ICD-10-CM | POA: Diagnosis present

## 2024-08-06 DIAGNOSIS — Z803 Family history of malignant neoplasm of breast: Secondary | ICD-10-CM

## 2024-08-06 DIAGNOSIS — K769 Liver disease, unspecified: Secondary | ICD-10-CM | POA: Diagnosis not present

## 2024-08-06 DIAGNOSIS — R1084 Generalized abdominal pain: Secondary | ICD-10-CM | POA: Diagnosis not present

## 2024-08-06 DIAGNOSIS — R140 Abdominal distension (gaseous): Secondary | ICD-10-CM | POA: Diagnosis not present

## 2024-08-06 DIAGNOSIS — D649 Anemia, unspecified: Secondary | ICD-10-CM | POA: Diagnosis present

## 2024-08-06 DIAGNOSIS — K5730 Diverticulosis of large intestine without perforation or abscess without bleeding: Secondary | ICD-10-CM | POA: Diagnosis not present

## 2024-08-06 DIAGNOSIS — K56609 Unspecified intestinal obstruction, unspecified as to partial versus complete obstruction: Principal | ICD-10-CM | POA: Diagnosis present

## 2024-08-06 DIAGNOSIS — Z881 Allergy status to other antibiotic agents status: Secondary | ICD-10-CM

## 2024-08-06 DIAGNOSIS — K5989 Other specified functional intestinal disorders: Secondary | ICD-10-CM | POA: Diagnosis not present

## 2024-08-06 DIAGNOSIS — Z8249 Family history of ischemic heart disease and other diseases of the circulatory system: Secondary | ICD-10-CM | POA: Diagnosis not present

## 2024-08-06 DIAGNOSIS — E878 Other disorders of electrolyte and fluid balance, not elsewhere classified: Secondary | ICD-10-CM | POA: Diagnosis present

## 2024-08-06 DIAGNOSIS — Z4682 Encounter for fitting and adjustment of non-vascular catheter: Secondary | ICD-10-CM | POA: Diagnosis not present

## 2024-08-06 DIAGNOSIS — K56600 Partial intestinal obstruction, unspecified as to cause: Principal | ICD-10-CM | POA: Diagnosis present

## 2024-08-06 DIAGNOSIS — E871 Hypo-osmolality and hyponatremia: Secondary | ICD-10-CM | POA: Diagnosis not present

## 2024-08-06 DIAGNOSIS — N179 Acute kidney failure, unspecified: Secondary | ICD-10-CM | POA: Diagnosis present

## 2024-08-06 DIAGNOSIS — E66811 Obesity, class 1: Secondary | ICD-10-CM | POA: Diagnosis present

## 2024-08-06 DIAGNOSIS — Z833 Family history of diabetes mellitus: Secondary | ICD-10-CM | POA: Diagnosis not present

## 2024-08-06 DIAGNOSIS — R9389 Abnormal findings on diagnostic imaging of other specified body structures: Secondary | ICD-10-CM | POA: Diagnosis not present

## 2024-08-06 DIAGNOSIS — K6389 Other specified diseases of intestine: Secondary | ICD-10-CM | POA: Diagnosis not present

## 2024-08-06 DIAGNOSIS — N858 Other specified noninflammatory disorders of uterus: Secondary | ICD-10-CM

## 2024-08-06 DIAGNOSIS — E785 Hyperlipidemia, unspecified: Secondary | ICD-10-CM | POA: Diagnosis not present

## 2024-08-06 DIAGNOSIS — J384 Edema of larynx: Secondary | ICD-10-CM | POA: Diagnosis not present

## 2024-08-06 DIAGNOSIS — Z923 Personal history of irradiation: Secondary | ICD-10-CM

## 2024-08-06 DIAGNOSIS — E876 Hypokalemia: Secondary | ICD-10-CM | POA: Diagnosis present

## 2024-08-06 DIAGNOSIS — D72829 Elevated white blood cell count, unspecified: Secondary | ICD-10-CM | POA: Diagnosis present

## 2024-08-06 DIAGNOSIS — Z8041 Family history of malignant neoplasm of ovary: Secondary | ICD-10-CM

## 2024-08-06 DIAGNOSIS — Z79899 Other long term (current) drug therapy: Secondary | ICD-10-CM

## 2024-08-06 DIAGNOSIS — Z1152 Encounter for screening for COVID-19: Secondary | ICD-10-CM | POA: Diagnosis not present

## 2024-08-06 DIAGNOSIS — I119 Hypertensive heart disease without heart failure: Secondary | ICD-10-CM | POA: Diagnosis present

## 2024-08-06 DIAGNOSIS — K219 Gastro-esophageal reflux disease without esophagitis: Secondary | ICD-10-CM | POA: Diagnosis present

## 2024-08-06 DIAGNOSIS — Z539 Procedure and treatment not carried out, unspecified reason: Secondary | ICD-10-CM | POA: Diagnosis not present

## 2024-08-06 DIAGNOSIS — I10 Essential (primary) hypertension: Secondary | ICD-10-CM

## 2024-08-06 DIAGNOSIS — I722 Aneurysm of renal artery: Secondary | ICD-10-CM | POA: Diagnosis not present

## 2024-08-06 DIAGNOSIS — Z86000 Personal history of in-situ neoplasm of breast: Secondary | ICD-10-CM

## 2024-08-06 DIAGNOSIS — Z6831 Body mass index (BMI) 31.0-31.9, adult: Secondary | ICD-10-CM | POA: Diagnosis not present

## 2024-08-06 DIAGNOSIS — R112 Nausea with vomiting, unspecified: Secondary | ICD-10-CM | POA: Diagnosis not present

## 2024-08-06 DIAGNOSIS — K4190 Unilateral femoral hernia, without obstruction or gangrene, not specified as recurrent: Secondary | ICD-10-CM | POA: Diagnosis present

## 2024-08-06 DIAGNOSIS — Z885 Allergy status to narcotic agent status: Secondary | ICD-10-CM

## 2024-08-06 DIAGNOSIS — R109 Unspecified abdominal pain: Secondary | ICD-10-CM

## 2024-08-06 LAB — COMPREHENSIVE METABOLIC PANEL WITH GFR
ALT: 15 U/L (ref 0–44)
AST: 21 U/L (ref 15–41)
Albumin: 3.4 g/dL — ABNORMAL LOW (ref 3.5–5.0)
Alkaline Phosphatase: 53 U/L (ref 38–126)
Anion gap: 13 (ref 5–15)
BUN: 11 mg/dL (ref 8–23)
CO2: 25 mmol/L (ref 22–32)
Calcium: 8.8 mg/dL — ABNORMAL LOW (ref 8.9–10.3)
Chloride: 96 mmol/L — ABNORMAL LOW (ref 98–111)
Creatinine, Ser: 1.26 mg/dL — ABNORMAL HIGH (ref 0.44–1.00)
GFR, Estimated: 43 mL/min — ABNORMAL LOW (ref >60)
Glucose, Bld: 121 mg/dL — ABNORMAL HIGH (ref 70–99)
Potassium: 2.5 mmol/L — CL (ref 3.5–5.1)
Sodium: 134 mmol/L — ABNORMAL LOW (ref 135–145)
Total Bilirubin: 0.8 mg/dL (ref 0.0–1.2)
Total Protein: 7.4 g/dL (ref 6.5–8.1)

## 2024-08-06 LAB — LIPASE, BLOOD: Lipase: 19 U/L (ref 11–51)

## 2024-08-06 LAB — PHOSPHORUS: Phosphorus: 2.5 mg/dL (ref 2.5–4.6)

## 2024-08-06 LAB — CBC
HCT: 35.3 % — ABNORMAL LOW (ref 36.0–46.0)
Hemoglobin: 12 g/dL (ref 12.0–15.0)
MCH: 28.6 pg (ref 26.0–34.0)
MCHC: 34 g/dL (ref 30.0–36.0)
MCV: 84 fL (ref 80.0–100.0)
Platelets: 353 K/uL (ref 150–400)
RBC: 4.2 MIL/uL (ref 3.87–5.11)
RDW: 14.9 % (ref 11.5–15.5)
WBC: 8.1 K/uL (ref 4.0–10.5)
nRBC: 0 % (ref 0.0–0.2)

## 2024-08-06 LAB — MAGNESIUM: Magnesium: 1.8 mg/dL (ref 1.7–2.4)

## 2024-08-06 MED ORDER — TRAZODONE HCL 50 MG PO TABS
25.0000 mg | ORAL_TABLET | Freq: Every evening | ORAL | Status: DC | PRN
  Administered 2024-08-12 – 2024-08-13 (×2): 25 mg via ORAL
  Filled 2024-08-06 (×2): qty 1

## 2024-08-06 MED ORDER — ACETAMINOPHEN 650 MG RE SUPP
650.0000 mg | Freq: Four times a day (QID) | RECTAL | Status: DC | PRN
  Administered 2024-08-10: 650 mg via RECTAL
  Filled 2024-08-06: qty 1

## 2024-08-06 MED ORDER — ENOXAPARIN SODIUM 40 MG/0.4ML IJ SOSY
0.5000 mg/kg | PREFILLED_SYRINGE | INTRAMUSCULAR | Status: DC
  Administered 2024-08-07 – 2024-08-16 (×10): 40 mg via SUBCUTANEOUS
  Filled 2024-08-06 (×10): qty 0.4

## 2024-08-06 MED ORDER — VITAMIN B-12 100 MCG PO TABS
100.0000 ug | ORAL_TABLET | Freq: Every day | ORAL | Status: AC
Start: 2024-08-07 — End: ?
  Administered 2024-08-07: 100 ug via ORAL
  Filled 2024-08-06 (×2): qty 1

## 2024-08-06 MED ORDER — ACETAMINOPHEN 325 MG PO TABS: 650.0000 mg | ORAL_TABLET | Freq: Four times a day (QID) | ORAL | Status: DC | PRN

## 2024-08-06 MED ORDER — TAMOXIFEN CITRATE 10 MG PO TABS
20.0000 mg | ORAL_TABLET | Freq: Every day | ORAL | Status: DC
  Administered 2024-08-07 – 2024-08-16 (×7): 20 mg via ORAL
  Filled 2024-08-06 (×10): qty 2

## 2024-08-06 MED ORDER — LATANOPROST 0.005 % OP SOLN
1.0000 [drp] | Freq: Every day | OPHTHALMIC | Status: DC
  Administered 2024-08-09 – 2024-08-14 (×6): 1 [drp] via OPHTHALMIC
  Filled 2024-08-06 (×2): qty 2.5

## 2024-08-06 MED ORDER — SODIUM CHLORIDE 0.9 % IV SOLN: INTRAVENOUS | Status: DC

## 2024-08-06 MED ORDER — IOHEXOL 300 MG/ML  SOLN
80.0000 mL | Freq: Once | INTRAMUSCULAR | Status: AC | PRN
  Administered 2024-08-06: 80 mL via INTRAVENOUS

## 2024-08-06 MED ORDER — POTASSIUM CHLORIDE 10 MEQ/100ML IV SOLN
10.0000 meq | INTRAVENOUS | Status: AC
  Administered 2024-08-06 – 2024-08-07 (×3): 10 meq via INTRAVENOUS
  Filled 2024-08-06 (×2): qty 100

## 2024-08-06 MED ORDER — ATORVASTATIN CALCIUM 10 MG PO TABS
10.0000 mg | ORAL_TABLET | Freq: Every morning | ORAL | Status: DC
Start: 2024-08-07 — End: 2024-08-07
  Administered 2024-08-07: 10 mg via ORAL
  Filled 2024-08-06: qty 1

## 2024-08-06 MED ORDER — VERAPAMIL HCL ER 240 MG PO TBCR
240.0000 mg | EXTENDED_RELEASE_TABLET | Freq: Every day | ORAL | Status: DC
  Administered 2024-08-07 – 2024-08-16 (×7): 240 mg via ORAL
  Filled 2024-08-06 (×10): qty 1

## 2024-08-06 MED ORDER — SODIUM CHLORIDE 0.9 % IV BOLUS
1000.0000 mL | Freq: Once | INTRAVENOUS | Status: AC
  Administered 2024-08-06: 1000 mL via INTRAVENOUS

## 2024-08-06 MED ORDER — ONDANSETRON HCL 4 MG PO TABS: 4.0000 mg | ORAL_TABLET | Freq: Four times a day (QID) | ORAL | Status: DC | PRN

## 2024-08-06 MED ORDER — ONDANSETRON HCL 4 MG/2ML IJ SOLN
4.0000 mg | Freq: Four times a day (QID) | INTRAMUSCULAR | Status: DC | PRN
  Administered 2024-08-07 – 2024-08-13 (×14): 4 mg via INTRAVENOUS
  Filled 2024-08-06 (×14): qty 2

## 2024-08-06 MED ORDER — LOSARTAN POTASSIUM-HCTZ 100-25 MG PO TABS: 1.0000 | ORAL_TABLET | Freq: Every day | ORAL | Status: DC

## 2024-08-06 MED ORDER — POTASSIUM CHLORIDE CRYS ER 20 MEQ PO TBCR: 20.0000 meq | EXTENDED_RELEASE_TABLET | Freq: Every day | ORAL | Status: DC

## 2024-08-06 MED ORDER — DORZOLAMIDE HCL-TIMOLOL MAL 2-0.5 % OP SOLN
1.0000 [drp] | Freq: Two times a day (BID) | OPHTHALMIC | Status: DC
  Administered 2024-08-09 – 2024-08-15 (×11): 1 [drp] via OPHTHALMIC
  Filled 2024-08-06 (×2): qty 10

## 2024-08-06 NOTE — H&P (Addendum)
 [place]   PATIENT NAME: Katie Thompson    MR#:  [id number]  DATE OF BIRTH:  1945/11/23  DATE OF ADMISSION:  08/06/2024  PRIMARY CARE PHYSICIAN: [name] BIRCH, MD   Patient is coming from: Home  REQUESTING/REFERRING PHYSICIAN: Waymond Lorelle Cummins, MD  CHIEF COMPLAINT:   Chief Complaint  Patient presents with   Diarrhea    HISTORY OF PRESENT ILLNESS:  Katie Thompson is a 78 y.o. African-American female with medical history significant for type 2 diabetes mellitus, GERD, hypertension and dyslipidemia, who presented to the emergency room with acute onset of diarrhea over the last couple of days which has been nonbloody and profuse with no mucus or melena.  She has been having lower abdominal cramps and nausea without vomiting.  No dysuria, oliguria or hematuria or flank pain.  No chest pain or palpitations.  No cough or wheezing or dyspnea.  She has been feeling generally weak.  Her diarrhea has stopped today.  ED Course: When she came to the ER, vital signs were within normal.  Labs revealed hypokalemia of 2.5 and hyponatremia 134 with hypochloremia 96, creatinine 1.26 and calcium  8.8 with albumin 3.4.  CBC was unremarkable. EKG as reviewed by me :  EKG showed sinus rhythm with a rate of 70 with probable left atrial hypertrophy and inferior Q waves as well as prolonged QT interval with QTc of 584 MS. Imaging: Pelvic CT scan revealed: 1. Findings concerning for small bowel obstruction. Associated long segment of small bowel wall thickening, without pneumatosis or free air. Consider surgical consultation. 2. Right femoral hernia containing decompressed distal small bowel. 3. Central uterine mass measuring 3.6 cm, favoring an endometrial mass rather than uterine fibroid. Consider pelvic US  for further evaluation. 4. Additional ancillary findings, as above.  Contact was made with Dr. Desiderio who is aware about the patient.  The patient was given 1 L bolus of IV normal saline and 10  mill colons IV potassium chloride .  The patient will be admitted to a medical telemetry bed for further evaluation and management.   PAST MEDICAL HISTORY:   Past Medical History:  Diagnosis Date   Arthritis    Breast cancer (HCC) 2014   LT with radiation   Diabetes mellitus without complication (HCC)    diet controlled no meds   Diverticulosis feb. 2015   from colonoscopy   GERD (gastroesophageal reflux disease)    History of hiatal hernia    Hyperlipidemia    Hypertension    Personal history of radiation therapy 2014   DCIS left breast    PAST SURGICAL HISTORY:   Past Surgical History:  Procedure Laterality Date   APPENDECTOMY     BREAST BIOPSY Left 2016   coil marker, BENIGN BREAST TISSUE WITH CLUSTERED MICROCYSTS AND STROMAL FIBROSIS.    BREAST BIOPSY Left 01/05/2018   Affirm Bx- x marker, FIBROCYSTIC CHANGE WITH MICROCALCIFICATIONS   BREAST BIOPSY Left 02/04/2013   DCIS   BREAST BIOPSY Left 01/29/2021   Stereo Bx, X-Clip, INTRADUCTAL PAPILLOMA WITH SCLEROSING ADENOSIS    BREAST BIOPSY WITH RADIO FREQUENCY LOCALIZER Left 02/28/2021   Procedure: BREAST BIOPSY WITH RADIO FREQUENCY LOCALIZER;  Surgeon: Rodolph Romano, MD;  Location: [facility] ORS;  Service: General;  Laterality: Left;   BREAST EXCISIONAL BIOPSY Left 07/29/2017   SCLEROTIC INTRADUCTAL PAPILLOMA.   BREAST EXCISIONAL BIOPSY Left 02/28/2021   neg   BREAST LUMPECTOMY Left 2014   DCIS with clear margins.    BTL  EXCISION OF BREAST LESION Left 07/29/2017   Procedure: EXCISION OF BREAST MASS;  Surgeon: Claudene Larinda Bolder, MD;  Location: [facility] ORS;  Service: General;  Laterality: Left;   HERNIA REPAIR     NASAL SINUS SURGERY      SOCIAL HISTORY:   Social History   Tobacco Use   Smoking status: Never   Smokeless tobacco: Never  Substance Use Topics   Alcohol  use: No    FAMILY HISTORY:   Family History  Problem Relation Age of Onset   Ovarian cancer Mother    Hypertension Mother     Diabetes Paternal Grandmother    Hypertension Paternal Grandmother    Breast cancer Paternal Aunt 90    DRUG ALLERGIES:   Allergies  Allergen Reactions   Codeine Nausea And Vomiting   Gatifloxacin Hives    REVIEW OF SYSTEMS:   ROS As per history of present illness. All pertinent systems were reviewed above. Constitutional, HEENT, cardiovascular, respiratory, GI, GU, musculoskeletal, neuro, psychiatric, endocrine, integumentary and hematologic systems were reviewed and are otherwise negative/unremarkable except for positive findings mentioned above in the HPI.   MEDICATIONS AT HOME:   Prior to Admission medications   Medication Sig Start Date End Date Taking? Authorizing Provider  aspirin EC 81 MG tablet Take 81 mg by mouth daily.  09/19/14   [provider]  atorvastatin  (LIPITOR) 10 MG tablet Take 10 mg by mouth every morning. 09/19/14   [provider]  calcium  carbonate (OS-CAL) 600 MG TABS tablet Take 1 tablet (600 mg total) by mouth 2 (two) times daily with a meal. 04/15/24   Babara Call, MD  Cholecalciferol  (VITAMIN D3) 50 MCG (2000 UT) capsule Take 1 capsule (2,000 Units total) by mouth daily. 04/15/24   Yu, Zhou, MD  Cyanocobalamin (VITAMIN B12 PO) Take 1 tablet by mouth daily.    [provider]  dorzolamide -timolol  (COSOPT ) 22.3-6.8 MG/ML ophthalmic solution 1 drop 2 (two) times daily. 08/03/21   [provider]  latanoprost  (XALATAN ) 0.005 % ophthalmic solution Place 1 drop into the left eye at bedtime. 12/12/20   [provider]  losartan -hydrochlorothiazide  (HYZAAR) 100-25 MG tablet Take 1 tablet by mouth daily. 09/12/21   [provider]  Omeprazole 20 MG TBEC Take 20 mg by mouth every morning.    [provider]  potassium chloride  SA (KLOR-CON  M) 20 MEQ tablet Take 1 tablet (20 mEq total) by mouth daily. 10/10/22   Babara Call, MD  tamoxifen  (NOLVADEX ) 20 MG tablet Take 1 tablet (20 mg total) by mouth daily. 04/15/24    Babara Call, MD  verapamil  (CALAN -SR) 240 MG CR tablet Take 240 mg by mouth every morning. 11/18/14   [provider]      VITAL SIGNS:  Blood pressure 114/77, pulse 87, temperature 98 F (36.7 C), temperature source Oral, resp. rate 18, height 5' 3 (1.6 m), weight 79.8 kg, SpO2 98%.  PHYSICAL EXAMINATION:  Physical Exam  GENERAL:  78 y.o.-year-old patient lying in the bed with no acute distress.  EYES: Pupils equal, round, reactive to light and accommodation. No scleral icterus. Extraocular muscles intact.  HEENT: Head atraumatic, normocephalic. Oropharynx and nasopharynx clear.  NECK:  Supple, no jugular venous distention. No thyroid enlargement, no tenderness.  LUNGS: Normal breath sounds bilaterally, no wheezing, rales,rhonchi or crepitation. No use of accessory muscles of respiration.  CARDIOVASCULAR: Regular rate and rhythm, S1, S2 normal. No murmurs, rubs, or gallops.  ABDOMEN: Soft, distended, nontender. Diminished bowel sounds. No organomegaly or mass.  EXTREMITIES: No pedal edema, cyanosis, or clubbing.  NEUROLOGIC: Cranial nerves II through XII are intact. Muscle strength 5/5 in all extremities. Sensation intact. Gait not checked.  PSYCHIATRIC: The patient is alert and oriented x 3.  Normal affect and good eye contact. SKIN: No obvious rash, lesion, or ulcer.   LABORATORY PANEL:   CBC Recent Labs  Lab 08/06/24 2011  WBC 8.1  HGB 12.0  HCT 35.3*  PLT 353   ------------------------------------------------------------------------------------------------------------------  Chemistries  Recent Labs  Lab 08/06/24 2010 08/06/24 2011  NA  --  134*  K  --  2.5*  CL  --  96*  CO2  --  25  GLUCOSE  --  121*  BUN  --  11  CREATININE  --  1.26*  CALCIUM   --  8.8*  MG 1.8  --   AST  --  21  ALT  --  15  ALKPHOS  --  53  BILITOT  --  0.8    ------------------------------------------------------------------------------------------------------------------  Cardiac Enzymes No results for input(s): TROPONINI in the last 168 hours. ------------------------------------------------------------------------------------------------------------------  RADIOLOGY:  US  Pelvis Limited Result Date: 08/06/2024 EXAM: PELVIC ULTRASOUND TECHNIQUE: Transabdominal pelvic duplex ultrasound using B-mode/gray scaled imaging with/without Doppler spectral analysis and color flow was obtained. COMPARISON: None provided CLINICAL HISTORY: Uterine mass. FINDINGS: ULTRASOUND FINDINGS: UTERUS: The uterus is anteverted. The uterus measures 10.5 x 6.3 x 6.1 cm with a volume of 210 cc. As noted on prior CT imaging, a complex heterogeneously enhancing mass is visualized within the uterus, which may represent a complex endometrial mass or a submucosal mass, such as an involuting uterine fibroid, measuring roughly 6.2 x 6.2 cm by 4.7 cm . ENDOMETRIAL STRIPE: The endometrium is not well delineated on this examination. CERVIX: The cervix is not well visualized on this examination. RIGHT OVARY: The ovaries are not visualized. LEFT OVARY: The ovaries are not visualized. FREE FLUID: No free fluid is seen within the cul-de-sac. The examination is quite limited by the transabdominal technique, and exact localization of the previously identified mass is not possible in relation to the endometrial cavity. Transvaginal imaging or MRI examination is recommended for further characterization. IMPRESSION: 1. Complex heterogeneous uterine mass measuring approximately 6.2 x 6.2 x 4.7 cm; exact localization relative to the endometrial cavity is indeterminate due to limited transabdominal technique. Differential includes endocavitary mass versus submucosal fibroid. transvaginal ultrasound or pelvic MRI with IV contrast recommended for further characterization and localization. Electronically  signed by: Dorethia Molt MD 08/06/2024 11:35 PM EDT RP Workstation: [id number]   CT ABDOMEN PELVIS W CONTRAST Result Date: 08/06/2024 EXAM: CT ABDOMEN AND PELVIS WITH CONTRAST 08/06/2024 10:02:25 PM TECHNIQUE: CT of the abdomen and pelvis was performed with the administration of 80 mL of iohexol  (OMNIPAQUE ) 300 MG/ML solution. Multiplanar reformatted images are provided for review. Automated exposure control, iterative reconstruction, and/or weight-based adjustment of the mA/kV was utilized to reduce the radiation dose to as low as reasonably achievable. COMPARISON: 09/29/2021. CLINICAL HISTORY: Abdominal pain, acute, nonlocalized; diarrhea. RN notes: Pt states she has had diarrhea x2 days. Today just feeling wiped out. Endorsing decreased PO intake and nausea. Denies vomiting or fevers. Endorsing intermittent generalized abdominal pain (denies all pain currently). FINDINGS: LOWER CHEST: Elevated right hemidiaphragm. Right lower lobe atelectasis. Small hiatal hernia. LIVER: 1.5 cm fluid density lesion within left hepatic lobe, likely simple hepatic cyst. GALLBLADDER AND BILE DUCTS: Gallbladder is unremarkable. No biliary ductal dilatation. SPLEEN: No acute abnormality. PANCREAS: No acute abnormality. ADRENAL GLANDS: No acute abnormality. KIDNEYS,  URETERS AND BLADDER: 9.2 cm fluid density lesion in the left upper kidney, likely simple renal cyst. Per consensus, no follow-up is needed for simple Bosniak type 1 and 2 renal cysts, unless the patient has a malignancy history or risk factors. No stones in the kidneys or ureters. No hydronephrosis. No perinephric or periureteral stranding. Urinary bladder is unremarkable. GI AND BOWEL: Stomach demonstrates no acute abnormality. Dilated loops of small bowel in the right mid abdomen, including a loop with long segment wall thickening in the right pelvis (image 69). No pneumatosis. Decompressed loops of ileum in the pelvis. This appearance raises concern for bowel  obstruction. There is a right lower abdominal femoral hernia containing a loop of decompressed distal small bowel (image 81). Small fat-containing bilateral inguinal hernias. PERITONEUM AND RETROPERITONEUM: No ascites. No free air. VASCULATURE: Aorta is normal in caliber. 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 Mild atherosclerotic plaque of the aorta and its branches. LYMPH NODES: No lymphadenopathy. REPRODUCTIVE ORGANS: Uterine fibroids. Additional 3.6 cm central uterine mass (sagittal image 70) favors an endometrial mass over uterine fibroid, although poorly evaluated. BONES AND SOFT TISSUES: Multilevel degenerative spine changes. Grade 1 anterolisthesis of L4 on L5. Left inguinal hernia repair mesh noted. No acute osseous abnormality. No focal soft tissue abnormality. IMPRESSION: 1. Findings concerning for small bowel obstruction. Associated long segment of small bowel wall thickening, without pneumatosis or free air. Consider surgical consultation. 2. Right femoral hernia containing decompressed distal small bowel. 3. Central uterine mass measuring 3.6 cm, favoring an endometrial mass rather than uterine fibroid. Consider pelvic US  for further evaluation. 4. Additional ancillary findings, as above. Electronically signed by: Pinkie Pebbles MD 08/06/2024 10:12 PM EDT RP Workstation: [id number]      IMPRESSION AND PLAN:  Assessment and Plan: * SBO (small bowel obstruction) (HCC) - The patient will be admitted to a medical telemetry bed. - She will be kept NPO except for medications. - At this time the patient is not vomiting and NG tube is not needed. - Will be hydrated with IV normal saline. - Will optimize electrolytes. - Will hold off her aspirin . - General Surgery consult to be obtained. - Dr. Desiderio was notified about the patient. -Will follow two-view abdomen x-ray in AM.  Hypokalemia Potassium will be aggressively replaced.  Magnesium  will be checked  Dyslipidemia - Continue statin  therapy  Type 2 diabetes mellitus without complications (HCC) - The patient will be placed on supplemental coverage with NovoLog .  Essential hypertension - Will continue antihypertensive therapy while holding off diuretic therapy given her hypokalemia.  GERD without esophagitis - Will continue with IV PPI therapy.   DVT prophylaxis: Lovenox  .  Advanced Care Planning:  Code Status: full code.  Family Communication:  The plan of care was discussed in details with the patient (and family). I answered all questions. The patient agreed to proceed with the above mentioned plan. Further management will depend upon hospital course. Disposition Plan: Back to previous home environment Consults called: General Surgery All the records are reviewed and case discussed with ED provider.  Status is: Inpatient    At the time of the admission, it appears that the appropriate admission status for this patient is inpatient.  This is judged to be reasonable and necessary in order to provide the required intensity of service to ensure the patient's safety given the presenting symptoms, physical exam findings and initial radiographic and laboratory data in the context of comorbid conditions.  The patient requires inpatient status due to  high intensity of service, high risk of further deterioration and high frequency of surveillance required.  I certify that at the time of admission, it is my clinical judgment that the patient will require inpatient hospital care extending more than 2 midnights.                            Dispo: The patient is from: Home              Anticipated d/c is to: Home              Patient currently is not medically stable to d/c.              Difficult to place patient: No  Madison DELENA Peaches M.D on 08/07/2024 at 12:37 AM  [facility]   From 7 PM-7 AM, contact night-coverage [email or web address]  CC: Primary care physician; [name] BIRCH, MD

## 2024-08-06 NOTE — H&P (Incomplete)
    PATIENT NAME: Katie Thompson    MR#:  [id number]  DATE OF BIRTH:  [birth year]  DATE OF ADMISSION:  08/06/2024  PRIMARY CARE PHYSICIAN: [name] BIRCH, MD   Patient is coming from: Home  REQUESTING/REFERRING PHYSICIAN: Waymond Lorelle Cummins, MD  CHIEF COMPLAINT:   Chief Complaint  Patient presents with  . Diarrhea    HISTORY OF PRESENT ILLNESS:  Katie Thompson is a 78 y.o. female with medical history significant for type 2 diabetes mellitus, GERD, hypertension and dyslipidemia, who presented to the emergency room with acute onset of diarrhea over the last couple of days which has been nonbloody and profuse with no mucus or melena.  She has been having lower abdominal cramps and nausea without vomiting.  No dysuria, oliguria or hematuria or flank pain.  No chest pain or palpitations.  No cough or wheezing or dyspnea.  She has been feeling generally weak.  Her diarrhea has stopped today.  ED Course: When she came to the ER, vital signs were within normal.  Labs revealed hypokalemia of 2.5 and hyponatremia 134 with hypochloremia 96, creatinine 1.26 and calcium  8.8 with albumin 3.4.  CBC was unremarkable. EKG as reviewed by me :  EKG showed sinus rhythm with a rate of 70 with probable left atrial hypertrophy and inferior Q waves as well as prolonged QT interval with QTc of 584 MS. Imaging: Pelvic CT scan revealed: 1. Findings concerning for small bowel obstruction. Associated long segment of small bowel wall thickening, without pneumatosis or free air. Consider surgical consultation. 2. Right femoral hernia containing decompressed distal small bowel. 3. Central uterine mass measuring 3.6 cm, favoring an endometrial mass rather than uterine fibroid. Consider pelvic US  for further evaluation. 4. Additional ancillary findings, as above.  Contact was made with Dr. Desiderio who is aware about the patient.  The patient will be admitted to a medical telemetry bed for further  evaluation and management.  The patient was given PAST MEDICAL HISTORY:   Past Medical History:  Diagnosis Date  . Arthritis   . Breast cancer (HCC) 2014   LT with radiation  . Diabetes mellitus without complication (HCC)    diet controlled no meds  . Diverticulosis feb. 2015   from colonoscopy  . GERD (gastroesophageal reflux disease)   . History of hiatal hernia   . Hyperlipidemia   . Hypertension   . Personal history of radiation therapy 2014   DCIS left breast    PAST SURGICAL HISTORY:   Past Surgical History:  Procedure Laterality Date  . APPENDECTOMY    . BREAST BIOPSY Left 2016   coil marker, BENIGN BREAST TISSUE WITH CLUSTERED MICROCYSTS AND STROMAL FIBROSIS.   [facility] BREAST BIOPSY Left 01/05/2018   Affirm Bx- x marker, FIBROCYSTIC CHANGE WITH MICROCALCIFICATIONS  . BREAST BIOPSY Left 02/04/2013   DCIS  . BREAST BIOPSY Left 01/29/2021   Stereo Bx, X-Clip, INTRADUCTAL PAPILLOMA WITH SCLEROSING ADENOSIS   . BREAST BIOPSY WITH RADIO FREQUENCY LOCALIZER Left 02/28/2021   Procedure: BREAST BIOPSY WITH RADIO FREQUENCY LOCALIZER;  Surgeon: Rodolph Romano, MD;  Location: [facility] ORS;  Service: General;  Laterality: Left;  . BREAST EXCISIONAL BIOPSY Left 07/29/2017   SCLEROTIC INTRADUCTAL PAPILLOMA.  [facility] BREAST EXCISIONAL BIOPSY Left 02/28/2021   neg  . BREAST LUMPECTOMY Left 2014   DCIS with clear margins.   . BTL    . EXCISION OF BREAST LESION Left 07/29/2017   Procedure: EXCISION OF BREAST MASS;  Surgeon:  Claudene Larinda Bolder, MD;  Location: [facility] ORS;  Service: General;  Laterality: Left;  . HERNIA REPAIR    . NASAL SINUS SURGERY      SOCIAL HISTORY:   Social History   Tobacco Use  . Smoking status: Never  . Smokeless tobacco: Never  Substance Use Topics  . Alcohol  use: No    FAMILY HISTORY:   Family History  Problem Relation Age of Onset  . Ovarian cancer Mother   . Hypertension Mother   . Diabetes Paternal Grandmother   . Hypertension Paternal  Grandmother   . Breast cancer Paternal Aunt 42    DRUG ALLERGIES:   Allergies  Allergen Reactions  . Codeine Nausea And Vomiting  . Gatifloxacin Hives    REVIEW OF SYSTEMS:   ROS As per history of present illness. All pertinent systems were reviewed above. Constitutional, HEENT, cardiovascular, respiratory, GI, GU, musculoskeletal, neuro, psychiatric, endocrine, integumentary and hematologic systems were reviewed and are otherwise negative/unremarkable except for positive findings mentioned above in the HPI.   MEDICATIONS AT HOME:   Prior to Admission medications   Medication Sig Start Date End Date Taking? Authorizing Provider  aspirin EC 81 MG tablet Take 81 mg by mouth daily.  09/19/14   [provider]  atorvastatin  (LIPITOR) 10 MG tablet Take 10 mg by mouth every morning. 09/19/14   [provider]  calcium  carbonate (OS-CAL) 600 MG TABS tablet Take 1 tablet (600 mg total) by mouth 2 (two) times daily with a meal. 04/15/24   Babara Call, MD  Cholecalciferol  (VITAMIN D3) 50 MCG (2000 UT) capsule Take 1 capsule (2,000 Units total) by mouth daily. 04/15/24   Yu, Zhou, MD  Cyanocobalamin (VITAMIN B12 PO) Take 1 tablet by mouth daily.    [provider]  dorzolamide -timolol  (COSOPT ) 22.3-6.8 MG/ML ophthalmic solution 1 drop 2 (two) times daily. 08/03/21   [provider]  latanoprost  (XALATAN ) 0.005 % ophthalmic solution Place 1 drop into the left eye at bedtime. 12/12/20   [provider]  losartan -hydrochlorothiazide  (HYZAAR) 100-25 MG tablet Take 1 tablet by mouth daily. 09/12/21   [provider]  Omeprazole 20 MG TBEC Take 20 mg by mouth every morning.    [provider]  potassium chloride  SA (KLOR-CON  M) 20 MEQ tablet Take 1 tablet (20 mEq total) by mouth daily. 10/10/22   Babara Call, MD  tamoxifen  (NOLVADEX ) 20 MG tablet Take 1 tablet (20 mg total) by mouth daily. 04/15/24   Babara Call, MD  verapamil  (CALAN -SR) 240 MG CR tablet  Take 240 mg by mouth every morning. 11/18/14   [provider]      VITAL SIGNS:  Blood pressure 114/77, pulse 87, temperature 98 F (36.7 C), temperature source Oral, resp. rate 18, SpO2 98%.  PHYSICAL EXAMINATION:  Physical Exam  GENERAL:  78 y.o.-year-old patient lying in the bed with no acute distress.  EYES: Pupils equal, round, reactive to light and accommodation. No scleral icterus. Extraocular muscles intact.  HEENT: Head atraumatic, normocephalic. Oropharynx and nasopharynx clear.  NECK:  Supple, no jugular venous distention. No thyroid enlargement, no tenderness.  LUNGS: Normal breath sounds bilaterally, no wheezing, rales,rhonchi or crepitation. No use of accessory muscles of respiration.  CARDIOVASCULAR: Regular rate and rhythm, S1, S2 normal. No murmurs, rubs, or gallops.  ABDOMEN: Soft, nondistended, nontender. Bowel sounds present. No organomegaly or mass.  EXTREMITIES: No pedal edema, cyanosis, or clubbing.  NEUROLOGIC: Cranial nerves II through XII are intact. Muscle strength 5/5 in all extremities.  Sensation intact. Gait not checked.  PSYCHIATRIC: The patient is alert and oriented x 3.  Normal affect and good eye contact. SKIN: No obvious rash, lesion, or ulcer.   LABORATORY PANEL:   CBC Recent Labs  Lab 08/06/24 2011  WBC 8.1  HGB 12.0  HCT 35.3*  PLT 353   ------------------------------------------------------------------------------------------------------------------  Chemistries  Recent Labs  Lab 08/06/24 2010 08/06/24 2011  NA  --  134*  K  --  2.5*  CL  --  96*  CO2  --  25  GLUCOSE  --  121*  BUN  --  11  CREATININE  --  1.26*  CALCIUM   --  8.8*  MG 1.8  --   AST  --  21  ALT  --  15  ALKPHOS  --  53  BILITOT  --  0.8   ------------------------------------------------------------------------------------------------------------------  Cardiac Enzymes No results for input(s): TROPONINI in the last 168  hours. ------------------------------------------------------------------------------------------------------------------  RADIOLOGY:  CT ABDOMEN PELVIS W CONTRAST Result Date: 08/06/2024 EXAM: CT ABDOMEN AND PELVIS WITH CONTRAST 08/06/2024 10:02:25 PM TECHNIQUE: CT of the abdomen and pelvis was performed with the administration of 80 mL of iohexol  (OMNIPAQUE ) 300 MG/ML solution. Multiplanar reformatted images are provided for review. Automated exposure control, iterative reconstruction, and/or weight-based adjustment of the mA/kV was utilized to reduce the radiation dose to as low as reasonably achievable. COMPARISON: 09/29/2021. CLINICAL HISTORY: Abdominal pain, acute, nonlocalized; diarrhea. RN notes: Pt states she has had diarrhea x2 days. Today just feeling wiped out. Endorsing decreased PO intake and nausea. Denies vomiting or fevers. Endorsing intermittent generalized abdominal pain (denies all pain currently). FINDINGS: LOWER CHEST: Elevated right hemidiaphragm. Right lower lobe atelectasis. Small hiatal hernia. LIVER: 1.5 cm fluid density lesion within left hepatic lobe, likely simple hepatic cyst. GALLBLADDER AND BILE DUCTS: Gallbladder is unremarkable. No biliary ductal dilatation. SPLEEN: No acute abnormality. PANCREAS: No acute abnormality. ADRENAL GLANDS: No acute abnormality. KIDNEYS, URETERS AND BLADDER: 9.2 cm fluid density lesion in the left upper kidney, likely simple renal cyst. Per consensus, no follow-up is needed for simple Bosniak type 1 and 2 renal cysts, unless the patient has a malignancy history or risk factors. No stones in the kidneys or ureters. No hydronephrosis. No perinephric or periureteral stranding. Urinary bladder is unremarkable. GI AND BOWEL: Stomach demonstrates no acute abnormality. Dilated loops of small bowel in the right mid abdomen, including a loop with long segment wall thickening in the right pelvis (image 69). No pneumatosis. Decompressed loops of ileum in  the pelvis. This appearance raises concern for bowel obstruction. There is a right lower abdominal femoral hernia containing a loop of decompressed distal small bowel (image 81). Small fat-containing bilateral inguinal hernias. PERITONEUM AND RETROPERITONEUM: No ascites. No free air. VASCULATURE: Aorta is normal in caliber. 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 Mild atherosclerotic plaque of the aorta and its branches. LYMPH NODES: No lymphadenopathy. REPRODUCTIVE ORGANS: Uterine fibroids. Additional 3.6 cm central uterine mass (sagittal image 70) favors an endometrial mass over uterine fibroid, although poorly evaluated. BONES AND SOFT TISSUES: Multilevel degenerative spine changes. Grade 1 anterolisthesis of L4 on L5. Left inguinal hernia repair mesh noted. No acute osseous abnormality. No focal soft tissue abnormality. IMPRESSION: 1. Findings concerning for small bowel obstruction. Associated long segment of small bowel wall thickening, without pneumatosis or free air. Consider surgical consultation. 2. Right femoral hernia containing decompressed distal small bowel. 3. Central uterine mass measuring 3.6 cm, favoring an endometrial mass rather than uterine fibroid. Consider pelvic  US  for further evaluation. 4. Additional ancillary findings, as above. Electronically signed by: Pinkie Pebbles MD 08/06/2024 10:12 PM EDT RP Workstation: [id number]      IMPRESSION AND PLAN:  Assessment and Plan: No notes have been filed under this hospital service. Service: Hospitalist      DVT prophylaxis: Lovenox ***  Advanced Care Planning:  Code Status: full code***  Family Communication:  The plan of care was discussed in details with the patient (and family). I answered all questions. The patient agreed to proceed with the above mentioned plan. Further management will depend upon hospital course. Disposition Plan: Back to previous home environment Consults called: none***  All the records are reviewed and case  discussed with ED provider.  Status is: Inpatient {Inpatient:23812}   At the time of the admission, it appears that the appropriate admission status for this patient is inpatient.  This is judged to be reasonable and necessary in order to provide the required intensity of service to ensure the patient's safety given the presenting symptoms, physical exam findings and initial radiographic and laboratory data in the context of comorbid conditions.  The patient requires inpatient status due to high intensity of service, high risk of further deterioration and high frequency of surveillance required.  I certify that at the time of admission, it is my clinical judgment that the patient will require inpatient hospital care extending more than 2 midnights.                            Dispo: The patient is from: Home              Anticipated d/c is to: Home              Patient currently is not medically stable to d/c.              Difficult to place patient: No  Madison DELENA Peaches M.D on 08/06/2024 at 11:36 PM  [facility]   From 7 PM-7 AM, contact night-coverage [email or web address]  CC: Primary care physician; [name] BIRCH, MD

## 2024-08-06 NOTE — ED Triage Notes (Addendum)
 Pt states she has had diarrhea x2 days. Today just feeling wiped out. Endorsing decreased PO intake and nausea. Denies vomiting or fevers. Endorsing intermittent generalized abdominal pain (denies all pain currently)

## 2024-08-06 NOTE — ED Provider Notes (Signed)
 [facility] Provider Note    Event Date/Time   First MD Initiated Contact with Patient 08/06/24 2111     (approximate)   History   Diarrhea   HPI  Katie Thompson is a 78 y.o. female with history of hypertension, hyperlipidemia, diabetes, GERD presenting with abdominal pain and nausea.  Patient has been having 2 days of diarrhea, states that is profuse, nonbloody.  Denies any recent hospitalization or antibiotic use.  No vomiting or urinary symptoms, no chest pain or shortness of breath.  States that diarrhea has stopped today.  Has been feeling generally weak.      Physical Exam   Triage Vital Signs: ED Triage Vitals  Encounter Vitals Group     BP 08/06/24 2008 114/77     Girls Systolic BP Percentile --      Girls Diastolic BP Percentile --      Boys Systolic BP Percentile --      Boys Diastolic BP Percentile --      Pulse Rate 08/06/24 2008 87     Resp 08/06/24 2008 18     Temp 08/06/24 2008 98 F (36.7 C)     Temp Source 08/06/24 2008 Oral     SpO2 08/06/24 2008 98 %     Weight --      Height --      Head Circumference --      Peak Flow --      Pain Score 08/06/24 2006 0     Pain Loc --      Pain Education --      Exclude from Growth Chart --     Most recent vital signs: Vitals:   08/06/24 2008  BP: 114/77  Pulse: 87  Resp: 18  Temp: 98 F (36.7 C)  SpO2: 98%     General: Awake, no distress.  CV:  Good peripheral perfusion.  Resp:  Normal effort.  Abd:  No distention.  Abdomen soft, tender at the periumbilical and suprapubic region.  No guarding Other:  Dry mucous membranes.   ED Results / Procedures / Treatments   Labs (all labs ordered are listed, but only abnormal results are displayed) Labs Reviewed  COMPREHENSIVE METABOLIC PANEL WITH GFR - Abnormal; Notable for the following components:      Result Value   Sodium 134 (*)    Potassium 2.5 (*)    Chloride 96 (*)    Glucose, Bld 121 (*)    Creatinine, Ser 1.26  (*)    Calcium  8.8 (*)    Albumin 3.4 (*)    GFR, Estimated 43 (*)    All other components within normal limits  CBC - Abnormal; Notable for the following components:   HCT 35.3 (*)    All other components within normal limits  RESP PANEL BY RT-PCR (RSV, FLU A&B, COVID)  RVPGX2  LIPASE, BLOOD  URINALYSIS, ROUTINE W REFLEX MICROSCOPIC  MAGNESIUM   PHOSPHORUS  LACTIC ACID, PLASMA  LACTIC ACID, PLASMA     EKG  EKG shows, sinus rhythm, rate 70, normal QS, normal QTc, no obvious ischemic ST elevation, baseline is wandering, T wave flattening in 2, T wave inversion to 3, not significantly compared to prior   RADIOLOGY On my independent interpretation, CT shows SBO   PROCEDURES:  Critical Care performed: Yes, see critical care procedure note(s)  .Critical Care  Performed by: Waymond Lorelle Cummins, MD Authorized by: Waymond Lorelle Cummins, MD   Critical care provider statement:  Critical care time (minutes):  40   Critical care was necessary to treat or prevent imminent or life-threatening deterioration of the following conditions:  Metabolic crisis   Critical care was time spent personally by me on the following activities:  Development of treatment plan with patient or surrogate, discussions with consultants, evaluation of patient's response to treatment, examination of patient, ordering and review of laboratory studies, ordering and review of radiographic studies, ordering and performing treatments and interventions, pulse oximetry, re-evaluation of patient's condition and review of old charts    MEDICATIONS ORDERED IN ED: Medications  potassium chloride  10 mEq in 100 mL IVPB (10 mEq Intravenous New Bag/Given 08/06/24 2140)  sodium chloride  0.9 % bolus 1,000 mL (1,000 mLs Intravenous New Bag/Given 08/06/24 2139)  iohexol  (OMNIPAQUE ) 300 MG/ML solution 80 mL (80 mLs Intravenous Contrast Given 08/06/24 2153)     IMPRESSION / MDM / ASSESSMENT AND PLAN / ED COURSE  I reviewed the triage vital  signs and the nursing notes.                              Differential diagnosis includes, but is not limited to, dehydration, electrolyte derangements, diverticulitis, viral illness, gastroenteritis, colitis.  Will get labs, CT, IV fluids.  Patient's presentation is most consistent with acute presentation with potential threat to life or bodily function.  Independent interpretation of labs and imaging below.  Patient is noted to be hypokalemic, will add on mag and Phos, will replete her potassium.  Clinical course as below.  Given her SBO, hypokalemia, uterine mass, she will need to be admitted for further management.  Discussed with patient and family about imaging and lab results including incidental findings.  They are agreeable with plan for admission.  Consult to hospitalist will admit the patient.  She is admitted.  The patient is on the cardiac monitor to evaluate for evidence of arrhythmia and/or significant heart rate changes.   Clinical Course as of 08/06/24 2307  Fri Aug 06, 2024  2122 Independent review of labs, she is noted to be hypokalemic, mild AKI, lipase is normal, LFTs are not elevated, no leukocytosis. [TT]  2218 CT ABDOMEN PELVIS W CONTRAST IMPRESSION: 1. Findings concerning for small bowel obstruction. Associated long segment of small bowel wall thickening, without pneumatosis or free air. Consider surgical consultation. 2. Right femoral hernia containing decompressed distal small bowel. 3. Central uterine mass measuring 3.6 cm, favoring an endometrial mass rather than uterine fibroid. Consider pelvic US  for further evaluation. 4. Additional ancillary findings, as above.   [TT]  2254 Went went to assess patient, she states that she has prior history of hernia status post repair, does not feel any palpable hernias at this time.  Palpated where the CT had shown the femoral hernia, was able to reduce some swelling there, patient has no pain before or after the  reduction.  Consulted surgery who looked through the images, does not think that the pelvic mass or the hernia is causing the SBO.  Recommended admission to hospitalist. [TT]  2258 Will order pelvic ultrasound since CT showed a uterine mass. [TT]    Clinical Course User Index [TT] Waymond Lorelle Cummins, MD     FINAL CLINICAL IMPRESSION(S) / ED DIAGNOSES   Final diagnoses:  Abdominal pain, unspecified abdominal location  Unilateral femoral hernia without obstruction or gangrene, recurrence not specified  Uterine mass  Hypokalemia  SBO (small bowel obstruction) (HCC)  Rx / DC Orders   ED Discharge Orders     None        Note:  This document was prepared using Dragon voice recognition software and may include unintentional dictation errors.    Waymond Lorelle Cummins, MD 08/06/24 [phone]

## 2024-08-07 ENCOUNTER — Inpatient Hospital Stay

## 2024-08-07 DIAGNOSIS — K56600 Partial intestinal obstruction, unspecified as to cause: Secondary | ICD-10-CM | POA: Diagnosis not present

## 2024-08-07 DIAGNOSIS — K56609 Unspecified intestinal obstruction, unspecified as to partial versus complete obstruction: Secondary | ICD-10-CM | POA: Diagnosis not present

## 2024-08-07 DIAGNOSIS — I10 Essential (primary) hypertension: Secondary | ICD-10-CM

## 2024-08-07 DIAGNOSIS — I722 Aneurysm of renal artery: Secondary | ICD-10-CM | POA: Diagnosis not present

## 2024-08-07 DIAGNOSIS — K219 Gastro-esophageal reflux disease without esophagitis: Secondary | ICD-10-CM | POA: Insufficient documentation

## 2024-08-07 DIAGNOSIS — E785 Hyperlipidemia, unspecified: Secondary | ICD-10-CM

## 2024-08-07 DIAGNOSIS — E119 Type 2 diabetes mellitus without complications: Secondary | ICD-10-CM

## 2024-08-07 LAB — RESP PANEL BY RT-PCR (RSV, FLU A&B, COVID)  RVPGX2
Influenza A by PCR: NEGATIVE
Influenza B by PCR: NEGATIVE
Resp Syncytial Virus by PCR: NEGATIVE
SARS Coronavirus 2 by RT PCR: NEGATIVE

## 2024-08-07 LAB — BASIC METABOLIC PANEL WITH GFR
Anion gap: 12 (ref 5–15)
BUN: 8 mg/dL (ref 8–23)
CO2: 24 mmol/L (ref 22–32)
Calcium: 7.5 mg/dL — ABNORMAL LOW (ref 8.9–10.3)
Chloride: 101 mmol/L (ref 98–111)
Creatinine, Ser: 1.04 mg/dL — ABNORMAL HIGH (ref 0.44–1.00)
GFR, Estimated: 54 mL/min — ABNORMAL LOW (ref >60)
Glucose, Bld: 95 mg/dL (ref 70–99)
Potassium: 2.9 mmol/L — ABNORMAL LOW (ref 3.5–5.1)
Sodium: 137 mmol/L (ref 135–145)

## 2024-08-07 LAB — URINALYSIS, ROUTINE W REFLEX MICROSCOPIC
Bilirubin Urine: NEGATIVE
Glucose, UA: NEGATIVE mg/dL
Hgb urine dipstick: NEGATIVE
Ketones, ur: NEGATIVE mg/dL
Leukocytes,Ua: NEGATIVE
Nitrite: NEGATIVE
Protein, ur: NEGATIVE mg/dL
Specific Gravity, Urine: 1.029 (ref 1.005–1.030)
pH: 7 (ref 5.0–8.0)

## 2024-08-07 LAB — CBC
HCT: 30.8 % — ABNORMAL LOW (ref 36.0–46.0)
Hemoglobin: 10.1 g/dL — ABNORMAL LOW (ref 12.0–15.0)
MCH: 28.1 pg (ref 26.0–34.0)
MCHC: 32.8 g/dL (ref 30.0–36.0)
MCV: 85.8 fL (ref 80.0–100.0)
Platelets: 285 K/uL (ref 150–400)
RBC: 3.59 MIL/uL — ABNORMAL LOW (ref 3.87–5.11)
RDW: 15.2 % (ref 11.5–15.5)
WBC: 6.4 K/uL (ref 4.0–10.5)
nRBC: 0 % (ref 0.0–0.2)

## 2024-08-07 LAB — GLUCOSE, CAPILLARY: Glucose-Capillary: 86 mg/dL (ref 70–99)

## 2024-08-07 LAB — LACTIC ACID, PLASMA
Lactic Acid, Venous: 2.8 mmol/L (ref 0.5–1.9)
Lactic Acid, Venous: 1.3 mmol/L (ref 0.5–1.9)

## 2024-08-07 LAB — MAGNESIUM: Magnesium: 1.6 mg/dL — ABNORMAL LOW (ref 1.7–2.4)

## 2024-08-07 LAB — HEMOGLOBIN A1C
Hgb A1c MFr Bld: 6.3 % — ABNORMAL HIGH (ref 4.8–5.6)
Mean Plasma Glucose: 134.11 mg/dL

## 2024-08-07 LAB — CBG MONITORING, ED: Glucose-Capillary: 83 mg/dL (ref 70–99)

## 2024-08-07 MED ORDER — LOSARTAN POTASSIUM 50 MG PO TABS
100.0000 mg | ORAL_TABLET | Freq: Every day | ORAL | Status: DC
Start: 2024-08-07 — End: 2024-08-15
  Administered 2024-08-07 – 2024-08-14 (×6): 100 mg via ORAL
  Filled 2024-08-07 (×7): qty 2

## 2024-08-07 MED ORDER — POTASSIUM CHLORIDE IN NACL 20-0.9 MEQ/L-% IV SOLN
INTRAVENOUS | Status: AC
  Filled 2024-08-07 (×3): qty 1000

## 2024-08-07 MED ORDER — POTASSIUM CHLORIDE 10 MEQ/100ML IV SOLN
10.0000 meq | INTRAVENOUS | Status: AC
  Administered 2024-08-07 (×4): 10 meq via INTRAVENOUS
  Filled 2024-08-07 (×4): qty 100

## 2024-08-07 MED ORDER — MAGNESIUM SULFATE 2 GM/50ML IV SOLN
2.0000 g | Freq: Once | INTRAVENOUS | Status: AC
  Administered 2024-08-07: 2 g via INTRAVENOUS
  Filled 2024-08-07: qty 50

## 2024-08-07 MED ORDER — INSULIN ASPART 100 UNIT/ML IJ SOLN: 0.0000 [IU] | INTRAMUSCULAR | Status: DC

## 2024-08-07 MED ORDER — POTASSIUM CHLORIDE 20 MEQ PO PACK: 40.0000 meq | PACK | Freq: Once | ORAL | Status: DC

## 2024-08-07 MED ORDER — HYDROCHLOROTHIAZIDE 25 MG PO TABS
25.0000 mg | ORAL_TABLET | Freq: Every day | ORAL | Status: DC
Start: 2024-08-07 — End: 2024-08-15
  Administered 2024-08-07 – 2024-08-14 (×6): 25 mg via ORAL
  Filled 2024-08-07 (×7): qty 1

## 2024-08-07 NOTE — Assessment & Plan Note (Signed)
-   Will continue with IV PPI therapy.

## 2024-08-07 NOTE — Progress Notes (Signed)
 Attempted NG tube placement with resistance met in nasal cavity and not able to place. Per pt, (and observation of history, there is history of nasal sinus surgery). Provider notified via secure chat.

## 2024-08-07 NOTE — Consult Note (Signed)
 Date of Consultation:  08/07/2024  Requesting Physician:  Lorelle Jerri Cheadle, MD  Reason for Consultation:  Small bowel obstruction  History of Present Illness: Katie Thompson is a 78 y.o. female presenting to the ER yesterday with history of diarrhea, abdominal pain, and nausea.  The patient reports that initially she was having diarrhea on 10/22 and 08/05/24.  This was associated with intermittent abdominal pain that would come in waves. Yesterday the diarrhea stopped and she has not had a BM since.  She's had some nausea but no vomiting.  Today, her pain is better but she feels weak.  She had flatus yesterday but not today.  Her labwork in the ER and this morning showed significant hypokalemia of 2.5 and 2.9, mild AKI likely from dehydration.  She had a CT scan of abdomen/pelvis which showed dilated loops of small bowel in the right abdomen, with a segment of wall thickening, followed by decompressed loops of small bowel distally, concerning for SBO.  There was also a possible uterine mass.  The patient has history of fibroids.  The patient reports having prior open appendectomy and also a tubal ligation and left inguinal hernia repair.  Past Medical History: Past Medical History:  Diagnosis Date   Arthritis    Breast cancer (HCC) 2014   LT with radiation   Diabetes mellitus without complication (HCC)    diet controlled no meds   Diverticulosis feb. 2015   from colonoscopy   GERD (gastroesophageal reflux disease)    History of hiatal hernia    Hyperlipidemia    Hypertension    Personal history of radiation therapy 2014   DCIS left breast     Past Surgical History: Past Surgical History:  Procedure Laterality Date   APPENDECTOMY     BREAST BIOPSY Left 2016   coil marker, BENIGN BREAST TISSUE WITH CLUSTERED MICROCYSTS AND STROMAL FIBROSIS.    BREAST BIOPSY Left 01/05/2018   Affirm Bx- x marker, FIBROCYSTIC CHANGE WITH MICROCALCIFICATIONS   BREAST BIOPSY Left 02/04/2013   DCIS    BREAST BIOPSY Left 01/29/2021   Stereo Bx, X-Clip, INTRADUCTAL PAPILLOMA WITH SCLEROSING ADENOSIS    BREAST BIOPSY WITH RADIO FREQUENCY LOCALIZER Left 02/28/2021   Procedure: BREAST BIOPSY WITH RADIO FREQUENCY LOCALIZER;  Surgeon: Rodolph Romano, MD;  Location: [facility] ORS;  Service: General;  Laterality: Left;   BREAST EXCISIONAL BIOPSY Left 07/29/2017   SCLEROTIC INTRADUCTAL PAPILLOMA.   BREAST EXCISIONAL BIOPSY Left 02/28/2021   neg   BREAST LUMPECTOMY Left 2014   DCIS with clear margins.    BTL     EXCISION OF BREAST LESION Left 07/29/2017   Procedure: EXCISION OF BREAST MASS;  Surgeon: Claudene Larinda Bolder, MD;  Location: [facility] ORS;  Service: General;  Laterality: Left;   HERNIA REPAIR     NASAL SINUS SURGERY      Home Medications: Prior to Admission medications   Medication Sig Start Date End Date Taking? Authorizing Provider  aspirin EC 81 MG tablet Take 81 mg by mouth daily.  09/19/14  Yes [provider]  atorvastatin  (LIPITOR) 10 MG tablet Take 10 mg by mouth every morning. 09/19/14  Yes [provider]  calcium  carbonate (OS-CAL) 600 MG TABS tablet Take 1 tablet (600 mg total) by mouth 2 (two) times daily with a meal. 04/15/24  Yes Babara Call, MD  Cholecalciferol  (VITAMIN D3) 50 MCG (2000 UT) capsule Take 1 capsule (2,000 Units total) by mouth daily. 04/15/24  Yes Babara Call, MD  Cyanocobalamin (VITAMIN B12  PO) Take 250 mcg by mouth daily.   Yes [provider]  dorzolamide -timolol  (COSOPT ) 22.3-6.8 MG/ML ophthalmic solution 1 drop 2 (two) times daily. 08/03/21  Yes [provider]  latanoprost  (XALATAN ) 0.005 % ophthalmic solution Place 1 drop into the left eye at bedtime. 12/12/20  Yes [provider]  losartan -hydrochlorothiazide  (HYZAAR) 100-25 MG tablet Take 1 tablet by mouth daily. 09/12/21  Yes [provider]  tamoxifen  (NOLVADEX ) 20 MG tablet Take 1 tablet (20 mg total) by mouth daily. 04/15/24  Yes Babara Call, MD  verapamil   (CALAN -SR) 240 MG CR tablet Take 240 mg by mouth every morning. 11/18/14  Yes [provider]  Omeprazole 20 MG TBEC Take 20 mg by mouth every morning. Patient not taking: Reported on 08/07/2024    [provider]  potassium chloride  SA (KLOR-CON  M) 20 MEQ tablet Take 1 tablet (20 mEq total) by mouth daily. Patient not taking: Reported on 08/07/2024 10/10/22   Babara Call, MD    Allergies: Allergies  Allergen Reactions   Codeine Nausea And Vomiting   Gatifloxacin Hives    Social History:  reports that she has never smoked. She has never used smokeless tobacco. She reports that she does not drink alcohol  and does not use drugs.   Family History: Family History  Problem Relation Age of Onset   Ovarian cancer Mother    Hypertension Mother    Diabetes Paternal Grandmother    Hypertension Paternal Grandmother    Breast cancer Paternal Aunt 72    Review of Systems: Review of Systems  Constitutional:  Negative for chills and fever.  Respiratory:  Negative for shortness of breath.   Cardiovascular:  Negative for chest pain.  Gastrointestinal:  Positive for abdominal pain, diarrhea and nausea. Negative for vomiting.  Genitourinary:  Negative for dysuria.  Musculoskeletal:  Negative for myalgias.    Physical Exam BP (!) 145/81 (BP Location: Right Arm)   Pulse 79   Temp 98.7 F (37.1 C) (Oral)   Resp 16   Ht 5' 3 (1.6 m)   Wt 79.8 kg   SpO2 99%   BMI 31.18 kg/m  CONSTITUTIONAL: No acute distress. HEENT:  Normocephalic, atraumatic, extraocular motion intact. RESPIRATORY:  Normal respiratory effort without pathologic use of accessory muscles. CARDIOVASCULAR: Regular rhythm and rate. GI: The abdomen is soft, mildly distended, non-tender currently.  Patient has well healed RLQ incision from prior appendectomy and a low midline incision.  MUSCULOSKELETAL:  Normal muscle strength and tone in all four extremities.  No peripheral edema or cyanosis. SKIN: Skin turgor  is normal. There are no pathologic skin lesions.  NEUROLOGIC:  Motor and sensation is grossly normal.  Cranial nerves are grossly intact. PSYCH:  Alert and oriented to person, place and time. Affect is normal.  Laboratory Analysis: Results for orders placed or performed during the hospital encounter of 08/06/24 (from the past 24 hours)  Urinalysis, Routine w reflex microscopic -Urine, Clean Catch     Status: Abnormal   Collection Time: 08/07/24  1:22 AM  Result Value Ref Range   Color, Urine STRAW (A) YELLOW   APPearance CLEAR (A) CLEAR   Specific Gravity, Urine 1.029 1.005 - 1.030   pH 7.0 5.0 - 8.0   Glucose, UA NEGATIVE NEGATIVE mg/dL   Hgb urine dipstick NEGATIVE NEGATIVE   Bilirubin Urine NEGATIVE NEGATIVE   Ketones, ur NEGATIVE NEGATIVE mg/dL   Protein, ur NEGATIVE NEGATIVE mg/dL   Nitrite NEGATIVE NEGATIVE   Leukocytes,Ua NEGATIVE NEGATIVE  Lactic acid,  plasma     Status: Abnormal   Collection Time: 08/07/24  1:22 AM  Result Value Ref Range   Lactic Acid, Venous 2.8 (HH) 0.5 - 1.9 mmol/L  Hemoglobin A1c     Status: Abnormal   Collection Time: 08/07/24  1:38 AM  Result Value Ref Range   Hgb A1c MFr Bld 6.3 (H) 4.8 - 5.6 %   Mean Plasma Glucose 134.11 mg/dL  Resp panel by RT-PCR (RSV, Flu A&B, Covid) Anterior Nasal Swab     Status: None   Collection Time: 08/07/24  2:38 AM   Specimen: Anterior Nasal Swab  Result Value Ref Range   SARS Coronavirus 2 by RT PCR NEGATIVE NEGATIVE   Influenza A by PCR NEGATIVE NEGATIVE   Influenza B by PCR NEGATIVE NEGATIVE   Resp Syncytial Virus by PCR NEGATIVE NEGATIVE  Lactic acid, plasma     Status: None   Collection Time: 08/07/24  7:24 AM  Result Value Ref Range   Lactic Acid, Venous 1.3 0.5 - 1.9 mmol/L  Basic metabolic panel     Status: Abnormal   Collection Time: 08/07/24  7:24 AM  Result Value Ref Range   Sodium 137 135 - 145 mmol/L   Potassium 2.9 (L) 3.5 - 5.1 mmol/L   Chloride 101 98 - 111 mmol/L   CO2 24 22 - 32 mmol/L    Glucose, Bld 95 70 - 99 mg/dL   BUN 8 8 - 23 mg/dL   Creatinine, Ser 8.95 (H) 0.44 - 1.00 mg/dL   Calcium  7.5 (L) 8.9 - 10.3 mg/dL   GFR, Estimated 54 (L) >60 mL/min   Anion gap 12 5 - 15  CBC     Status: Abnormal   Collection Time: 08/07/24  7:24 AM  Result Value Ref Range   WBC 6.4 4.0 - 10.5 K/uL   RBC 3.59 (L) 3.87 - 5.11 MIL/uL   Hemoglobin 10.1 (L) 12.0 - 15.0 g/dL   HCT 69.1 (L) 63.9 - 53.9 %   MCV 85.8 80.0 - 100.0 fL   MCH 28.1 26.0 - 34.0 pg   MCHC 32.8 30.0 - 36.0 g/dL   RDW 84.7 88.4 - 84.4 %   Platelets 285 150 - 400 K/uL   nRBC 0.0 0.0 - 0.2 %  Magnesium      Status: Abnormal   Collection Time: 08/07/24  7:24 AM  Result Value Ref Range   Magnesium  1.6 (L) 1.7 - 2.4 mg/dL  CBG monitoring, ED     Status: None   Collection Time: 08/07/24 11:45 AM  Result Value Ref Range   Glucose-Capillary 83 70 - 99 mg/dL  Glucose, capillary     Status: None   Collection Time: 08/07/24  5:45 PM  Result Value Ref Range   Glucose-Capillary 86 70 - 99 mg/dL    Imaging: DG Abd 2 Views Result Date: 08/07/2024 EXAM: 2 VIEW XRAY OF THE ABDOMEN 08/07/2024 09:57:11 AM COMPARISON: CT abdomen and pelvis 08/06/2024. CLINICAL HISTORY: FINDINGS: BOWEL: Mildly dilated small bowel loops in the right abdomen consistent with partial small bowel obstruction. SOFT TISSUES: Left inguinal hernia repair noted. 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 No opaque urinary calculi. BONES: No acute osseous abnormality. IMPRESSION: 1. Mildly dilated small bowel loops in the right abdomen consistent with partial small bowel obstruction. Electronically signed by: Waddell Calk MD 08/07/2024 10:25 AM EDT RP Workstation: [id number]   US  Pelvis Limited Result Date: 08/06/2024 EXAM: PELVIC ULTRASOUND TECHNIQUE: Transabdominal pelvic duplex ultrasound using B-mode/gray  scaled imaging with/without Doppler spectral analysis and color flow was obtained. COMPARISON: None provided CLINICAL HISTORY:  Uterine mass. FINDINGS: ULTRASOUND FINDINGS: UTERUS: The uterus is anteverted. The uterus measures 10.5 x 6.3 x 6.1 cm with a volume of 210 cc. As noted on prior CT imaging, a complex heterogeneously enhancing mass is visualized within the uterus, which may represent a complex endometrial mass or a submucosal mass, such as an involuting uterine fibroid, measuring roughly 6.2 x 6.2 cm by 4.7 cm . ENDOMETRIAL STRIPE: The endometrium is not well delineated on this examination. CERVIX: The cervix is not well visualized on this examination. RIGHT OVARY: The ovaries are not visualized. LEFT OVARY: The ovaries are not visualized. FREE FLUID: No free fluid is seen within the cul-de-sac. The examination is quite limited by the transabdominal technique, and exact localization of the previously identified mass is not possible in relation to the endometrial cavity. Transvaginal imaging or MRI examination is recommended for further characterization. IMPRESSION: 1. Complex heterogeneous uterine mass measuring approximately 6.2 x 6.2 x 4.7 cm; exact localization relative to the endometrial cavity is indeterminate due to limited transabdominal technique. Differential includes endocavitary mass versus submucosal fibroid. transvaginal ultrasound or pelvic MRI with IV contrast recommended for further characterization and localization. Electronically signed by: Dorethia Molt MD 08/06/2024 11:35 PM EDT RP Workstation: [id number]    Assessment and Plan: This is a 78 y.o. female with partial small bowel obstruction.  --Discussed with the patient the findings on her imaging studies.  There are dilated loops of small bowel with a transition point to decompressed bowel consistent with small bowel obstruction.  She's not having nausea or emesis and denies abdominal pain at the time.  This could be an episode of SBO and she had had prior abdominal surgeries to support this.  With the diarrhea she had, this could also be residual from  episode of gastroenteritis.  --For now, would manage conservatively with NPO diet, IV fluids, K repletion.  Can defer NG tube as she's not having any nausea/vomiting currently.   --Will order KUB for tomorrow. --Will continue to follow with you.  I spent 45 minutes dedicated to the care of this patient on the date of this encounter to include pre-visit review of records, face-to-face time with the patient discussing diagnosis and management, and any post-visit coordination of care.   Aloysius Sheree Plant, MD [facility] Pg:  [phone]

## 2024-08-07 NOTE — Assessment & Plan Note (Addendum)
-   The patient will be admitted to a medical telemetry bed. - She will be kept NPO except for medications. - At this time the patient is not vomiting and NG tube is not needed. - Will be hydrated with IV normal saline. - Will optimize electrolytes. - Will hold off her aspirin . - General Surgery consult to be obtained. - Dr. Desiderio was notified about the patient. -Will follow two-view abdomen x-ray in AM.

## 2024-08-07 NOTE — Assessment & Plan Note (Signed)
 Continue statin therapy

## 2024-08-07 NOTE — Assessment & Plan Note (Signed)
-   The patient will be placed on supplemental coverage with NovoLog. 

## 2024-08-07 NOTE — Progress Notes (Signed)
  PROGRESS NOTE    Katie Thompson  FMW:[id number] DOB: 06-04-1946 DOA: 08/06/2024 PCP: Katie Norleen [name], Katie Thompson  [id number]  LOS: 1 day   Brief hospital course:   Assessment & Plan: Katie Thompson is a 78 y.o. African-American female with medical history significant for type 2 diabetes mellitus, GERD, hypertension and dyslipidemia, who presented to the emergency room with acute onset of diarrhea over the last couple of days which has been nonbloody and profuse with no mucus or melena.  She has been having lower abdominal cramps and nausea without vomiting.    * SBO (small bowel obstruction) (HCC) - GenSurg consulted. --unsuccessful attempts at NG tube insertion today --NPO --cont MIVF  Hypokalemia Hypomag --monitor and supplement with IV  Dyslipidemia --resume statin after discharge  Type 2 diabetes mellitus without complications (HCC) --BG q6h to monitor for hypoglycemia  Essential hypertension --cont losartan , hydrochlorothiazide  and verapamil    DVT prophylaxis: Lovenox  SQ Code Status: Full code  Family Communication: grandson updated at bedside today Level of care: Telemetry Medical Dispo:   The patient is from: home Anticipated d/c is to: home Anticipated d/c date is: 2-3 days   Subjective and Interval History:  Pt had an episode of vomiting and reported feeling nauseated.  NG tube attempted but RN unable to insert.    No more diarrhea.  Pt reported passing gas.   Objective: Vitals:   08/07/24 1023 08/07/24 1142 08/07/24 1321 08/07/24 1446  BP: 138/69  (!) 140/77 138/71  Pulse:   78 77  Resp:   15 16  Temp:  98.4 F (36.9 C)  98.3 F (36.8 C)  TempSrc:  Oral    SpO2:   100% 100%  Weight:      Height:        Intake/Output Summary (Last 24 hours) at 08/07/2024 1929 Last data filed at 08/07/2024 1617 Gross per 24 hour  Intake 765 ml  Output 250 ml  Net 515 ml   Filed Weights   08/06/24 2345  Weight: 79.8 kg    Examination:   Constitutional:  NAD, AAOx3 HEENT: conjunctivae and lids normal, EOMI CV: No cyanosis.   RESP: normal respiratory effort, on RA Neuro: II - XII grossly intact.   Psych: Normal mood and affect.  Appropriate judgement and reason   Data Reviewed: I have personally reviewed labs and imaging studies  Time spent: 50 minutes  Ellouise Haber, Katie Thompson [facility] If 7PM-7AM, please contact night-coverage 08/07/2024, 7:29 PM

## 2024-08-07 NOTE — ED Notes (Signed)
 Aila from lab called. Pt Lactic acid 2.8.

## 2024-08-07 NOTE — Plan of Care (Signed)

## 2024-08-07 NOTE — Progress Notes (Signed)
   08/07/24 1446  Vitals  Temp 98.3 F (36.8 C)  BP 138/71  MAP (mmHg) 90  BP Location Right Arm  BP Method Automatic  Patient Position (if appropriate) Lying  Pulse Rate 77  Pulse Rate Source Monitor  Resp 16  MEWS COLOR  MEWS Score Color Green  Oxygen Therapy  SpO2 100 %  O2 Device Room Air  MEWS Score  MEWS Temp 0  MEWS Systolic 0  MEWS Pulse 0  MEWS RR 0  MEWS LOC 0  MEWS Score 0   Patient admitted from ED alert on room air with stable vitals.  Patient assisted to restroom and back to bed with safety precautions in place.  One episode of emesis measuring green in color noted; PRN Zofran  give and provider notified via secure chat.  Plan of care continues.

## 2024-08-07 NOTE — Assessment & Plan Note (Signed)
 Potassium will be aggressively replaced.  Magnesium  will be checked

## 2024-08-07 NOTE — Assessment & Plan Note (Signed)
-   Will continue antihypertensive therapy while holding off diuretic therapy given her hypokalemia.

## 2024-08-08 ENCOUNTER — Inpatient Hospital Stay

## 2024-08-08 DIAGNOSIS — K56609 Unspecified intestinal obstruction, unspecified as to partial versus complete obstruction: Secondary | ICD-10-CM | POA: Diagnosis not present

## 2024-08-08 LAB — BASIC METABOLIC PANEL WITH GFR
Anion gap: 9 (ref 5–15)
BUN: 11 mg/dL (ref 8–23)
CO2: 25 mmol/L (ref 22–32)
Calcium: 8 mg/dL — ABNORMAL LOW (ref 8.9–10.3)
Chloride: 105 mmol/L (ref 98–111)
Creatinine, Ser: 0.97 mg/dL (ref 0.44–1.00)
GFR, Estimated: 59 mL/min — ABNORMAL LOW (ref >60)
Glucose, Bld: 98 mg/dL (ref 70–99)
Potassium: 3.2 mmol/L — ABNORMAL LOW (ref 3.5–5.1)
Sodium: 139 mmol/L (ref 135–145)

## 2024-08-08 LAB — GLUCOSE, CAPILLARY
Glucose-Capillary: 105 mg/dL — ABNORMAL HIGH (ref 70–99)
Glucose-Capillary: 119 mg/dL — ABNORMAL HIGH (ref 70–99)
Glucose-Capillary: 92 mg/dL (ref 70–99)
Glucose-Capillary: 95 mg/dL (ref 70–99)

## 2024-08-08 LAB — MAGNESIUM: Magnesium: 2.3 mg/dL (ref 1.7–2.4)

## 2024-08-08 MED ORDER — PHENOL 1.4 % MT LIQD
1.0000 | OROMUCOSAL | Status: DC | PRN
  Administered 2024-08-08 – 2024-08-09 (×2): 1 via OROMUCOSAL
  Filled 2024-08-08: qty 177

## 2024-08-08 MED ORDER — POTASSIUM CHLORIDE IN NACL 20-0.9 MEQ/L-% IV SOLN
INTRAVENOUS | Status: DC
  Filled 2024-08-08 (×3): qty 1000

## 2024-08-08 MED ORDER — POTASSIUM CHLORIDE 10 MEQ/100ML IV SOLN
10.0000 meq | INTRAVENOUS | Status: DC
  Administered 2024-08-08: 10 meq via INTRAVENOUS
  Filled 2024-08-08: qty 100

## 2024-08-08 NOTE — Progress Notes (Signed)
  PROGRESS NOTE    Katie Thompson  FMW:[id number] DOB: 04-14-1946 DOA: 08/06/2024 PCP: [name] [name], MD  [id number]  LOS: 2 days   Brief hospital course:   Assessment & Plan: Katie Thompson is a 78 y.o. African-American female with medical history significant for type 2 diabetes mellitus, GERD, hypertension and dyslipidemia, who presented to the emergency room with acute onset of diarrhea over the last couple of days which has been nonbloody and profuse with no mucus or melena.  She has been having lower abdominal cramps and nausea without vomiting.    * SBO (small bowel obstruction) (HCC) - GenSurg consulted. --unsuccessful attempts at NG tube insertion on 10/25, but GenSurg was able to insert NG tube today --cont NGT intermittent low suction --cont MIVF  Hypokalemia Hypomag --supplement K with MIVF --supplement mag with IV  Dyslipidemia --resume statin after discharge  Type 2 diabetes mellitus without complications (HCC) --BG q6h to monitor for hypoglycemia  Essential hypertension --hold losartan , hydrochlorothiazide  and verapamil  since NPO   DVT prophylaxis: Lovenox  SQ Code Status: Full code  Family Communication: daughter updated at bedside today Level of care: Telemetry Medical Dispo:   The patient is from: home Anticipated d/c is to: home Anticipated d/c date is: 2-3 days   Subjective and Interval History:  Pt had nausea and reflux this morning.  GenSurg was able to insert NG tube, which had immediate return of liquid.   Objective: Vitals:   08/07/24 2000 08/08/24 0419 08/08/24 0745 08/08/24 1513  BP: (!) 145/81 139/64 (!) 142/71 132/83  Pulse: 79 77 77 90  Resp: 16 16 19 16   Temp: 98.7 F (37.1 C) 98.6 F (37 C) 98.4 F (36.9 C) (!) 97.5 F (36.4 C)  TempSrc: Oral  Oral   SpO2: 99% 96% 99% 98%  Weight:      Height:        Intake/Output Summary (Last 24 hours) at 08/08/2024 1744 Last data filed at 08/08/2024 1605 Gross per 24 hour  Intake  0 ml  Output 700 ml  Net -700 ml   Filed Weights   08/06/24 2345  Weight: 79.8 kg    Examination:   Constitutional: NAD, AAOx3 HEENT: conjunctivae and lids normal, EOMI CV: No cyanosis.   RESP: normal respiratory effort, on RA Abdomen: NG tube with active output Psych: depressed mood and affect.     Data Reviewed: I have personally reviewed labs and imaging studies  Time spent: 35 minutes  Ellouise Haber, MD [facility] If 7PM-7AM, please contact night-coverage 08/08/2024, 5:44 PM

## 2024-08-08 NOTE — Progress Notes (Signed)
 08/08/2024  Subjective: Patient had 2 episodes of emesis last night and remains nauseous this morning.  NG tube was attempted yesterday but was unsuccessful.  Patient reports feeling abdominal discomfort and nausea this morning.  However she also had a bowel movement this morning.  KUB this morning still shows dilated loops of small bowel.  Vital signs: Temp:  [98.3 F (36.8 C)-98.7 F (37.1 C)] 98.4 F (36.9 C) (10/26 0745) Pulse Rate:  [77-79] 77 (10/26 0745) Resp:  [15-19] 19 (10/26 0745) BP: (138-145)/(64-81) 142/71 (10/26 0745) SpO2:  [96 %-100 %] 99 % (10/26 0745)   Intake/Output: 10/25 0701 - 10/26 0700 In: 300 [I.V.:200; IV Piggyback:100] Out: 750 [Emesis/NG output:750] Last BM Date : 08/06/24  Physical Exam: Constitutional: No acute distress Abdomen: Soft, distended, with mild discomfort to palpation.  Labs:  Recent Labs    08/06/24 2011 08/07/24 0724  WBC 8.1 6.4  HGB 12.0 10.1*  HCT 35.3* 30.8*  PLT 353 285   Recent Labs    08/07/24 0724 08/08/24 0518  NA 137 139  K 2.9* 3.2*  CL 101 105  CO2 24 25  GLUCOSE 95 98  BUN 8 11  CREATININE 1.04* 0.97  CALCIUM  7.5* 8.0*   No results for input(s): LABPROT, INR in the last 72 hours.  Imaging: DG Abd 2 Views Result Date: 08/08/2024 EXAM: 2 VIEW XRAY OF THE ABDOMEN 08/08/2024 06:53:32 AM COMPARISON: 08/07/2024 CLINICAL HISTORY: FINDINGS: BOWEL: Mildly dilated small bowel loops in right abdomen consistent with persistent small-bowel obstruction, not significantly changed. SOFT TISSUES: Left inguinal hernia mesh repair noted. Peripherally calcified right renal artery aneurysm, unchanged. Elevated right hemidiaphragm. No opaque urinary calculi. BONES: No acute osseous abnormality. IMPRESSION: 1. Persistent small-bowel obstruction, not significantly changed. Electronically signed by: Waddell Calk MD 08/08/2024 07:02 AM EDT RP Workstation: [id number]    Assessment/Plan: This is a 79 y.o. female with small  bowel obstruction.  - Patient had episodes of emesis yesterday and KUB today still shows dilated loops.  She still feels nauseous even though she had a bowel movement.  I was able to place an NG tube at bedside today and KUB confirms it is in good place. - Discussed with patient that this tube will need to be in place at least for couple of days while we are waiting for return of bowel function.  Unfortunately she might still need surgery depending on how everything progresses.  Will consider doing Gastrografin tomorrow if no improvement.   I spent 35 minutes dedicated to the care of this patient on the date of this encounter to include pre-visit review of records, face-to-face time with the patient discussing diagnosis and management, and any post-visit coordination of care.  Aloysius Sheree Plant, MD [facility]

## 2024-08-09 ENCOUNTER — Inpatient Hospital Stay

## 2024-08-09 DIAGNOSIS — K56609 Unspecified intestinal obstruction, unspecified as to partial versus complete obstruction: Secondary | ICD-10-CM | POA: Diagnosis not present

## 2024-08-09 DIAGNOSIS — K5989 Other specified functional intestinal disorders: Secondary | ICD-10-CM | POA: Diagnosis not present

## 2024-08-09 DIAGNOSIS — Z4682 Encounter for fitting and adjustment of non-vascular catheter: Secondary | ICD-10-CM | POA: Diagnosis not present

## 2024-08-09 LAB — CBC
HCT: 35.4 % — ABNORMAL LOW (ref 36.0–46.0)
Hemoglobin: 11.7 g/dL — ABNORMAL LOW (ref 12.0–15.0)
MCH: 28.6 pg (ref 26.0–34.0)
MCHC: 33.1 g/dL (ref 30.0–36.0)
MCV: 86.6 fL (ref 80.0–100.0)
Platelets: 373 K/uL (ref 150–400)
RBC: 4.09 MIL/uL (ref 3.87–5.11)
RDW: 15.5 % (ref 11.5–15.5)
WBC: 7.3 K/uL (ref 4.0–10.5)
nRBC: 0 % (ref 0.0–0.2)

## 2024-08-09 LAB — BASIC METABOLIC PANEL WITH GFR
Anion gap: 12 (ref 5–15)
BUN: 18 mg/dL (ref 8–23)
CO2: 23 mmol/L (ref 22–32)
Calcium: 8.3 mg/dL — ABNORMAL LOW (ref 8.9–10.3)
Chloride: 104 mmol/L (ref 98–111)
Creatinine, Ser: 1.25 mg/dL — ABNORMAL HIGH (ref 0.44–1.00)
GFR, Estimated: 44 mL/min — ABNORMAL LOW (ref >60)
Glucose, Bld: 112 mg/dL — ABNORMAL HIGH (ref 70–99)
Potassium: 3.3 mmol/L — ABNORMAL LOW (ref 3.5–5.1)
Sodium: 139 mmol/L (ref 135–145)

## 2024-08-09 LAB — MAGNESIUM: Magnesium: 2.6 mg/dL — ABNORMAL HIGH (ref 1.7–2.4)

## 2024-08-09 LAB — GLUCOSE, CAPILLARY
Glucose-Capillary: 106 mg/dL — ABNORMAL HIGH (ref 70–99)
Glucose-Capillary: 110 mg/dL — ABNORMAL HIGH (ref 70–99)
Glucose-Capillary: 113 mg/dL — ABNORMAL HIGH (ref 70–99)
Glucose-Capillary: 116 mg/dL — ABNORMAL HIGH (ref 70–99)

## 2024-08-09 MED ORDER — POTASSIUM CHLORIDE IN NACL 20-0.9 MEQ/L-% IV SOLN
INTRAVENOUS | Status: DC
  Filled 2024-08-09: qty 1000

## 2024-08-09 MED ORDER — PANTOPRAZOLE SODIUM 40 MG IV SOLR
40.0000 mg | Freq: Every day | INTRAVENOUS | Status: DC
  Administered 2024-08-09 – 2024-08-12 (×3): 40 mg via INTRAVENOUS
  Filled 2024-08-09 (×4): qty 10

## 2024-08-09 MED ORDER — POTASSIUM CHLORIDE 10 MEQ/100ML IV SOLN
10.0000 meq | INTRAVENOUS | Status: AC
  Administered 2024-08-09 (×4): 10 meq via INTRAVENOUS
  Filled 2024-08-09 (×3): qty 100

## 2024-08-09 MED ORDER — DIATRIZOATE MEGLUMINE & SODIUM 66-10 % PO SOLN
90.0000 mL | Freq: Once | ORAL | Status: AC
  Administered 2024-08-09: 90 mL via NASOGASTRIC

## 2024-08-09 MED ORDER — POTASSIUM CHLORIDE IN NACL 40-0.9 MEQ/L-% IV SOLN
INTRAVENOUS | Status: AC
  Filled 2024-08-09: qty 1000

## 2024-08-09 NOTE — Progress Notes (Signed)
  PROGRESS NOTE    Katie Thompson  FMW:[id number] DOB: [birth year] DOA: 08/06/2024 PCP: [name] [name], MD  [id number]  LOS: 3 days   Brief hospital course:   Assessment & Plan: Katie Thompson is a 78 y.o. African-American female with medical history significant for type 2 diabetes mellitus, GERD, hypertension and dyslipidemia, who presented to the emergency room with acute onset of diarrhea over the last couple of days which has been nonbloody and profuse with no mucus or melena.  She has been having lower abdominal cramps and nausea without vomiting.    * SBO (small bowel obstruction) (HCC) - GenSurg consulted. --unsuccessful attempts at NG tube insertion on 10/25, but GenSurg was able to insert NG tube on 10/26 --cont NGT intermittent low suction --cont MIVF --gastrograffin test today  Hypokalemia Hypomag --supplement K with MIVF --supplement mag with IV  Dyslipidemia --resume statin after discharge  Type 2 diabetes mellitus without complications (HCC) --BG q6h to monitor for hypoglycemia --no need for SSI  Essential hypertension --hold losartan , hydrochlorothiazide  and verapamil  since NPO   DVT prophylaxis: Lovenox  SQ Code Status: Full code  Family Communication: grandson updated at bedside today Level of care: Telemetry Medical Dispo:   The patient is from: home Anticipated d/c is to: home Anticipated d/c date is: 2-3 days   Subjective and Interval History:  Pt continued to feel nauseated.  NG tube withdrew 7 cm for repositioning.  Pt reported leg welling, but denied dyspnea.   Objective: Vitals:   08/08/24 2100 08/09/24 0500 08/09/24 0817 08/09/24 1455  BP: 130/80 (!) 147/87 (!) 142/74 (!) 149/79  Pulse: 78 90 86 90  Resp: 18 18 19 17   Temp: 98.5 F (36.9 C) 98.3 F (36.8 C) 97.6 F (36.4 C) (!) 97.4 F (36.3 C)  TempSrc:   Oral Oral  SpO2: 98% 99% 98% 98%  Weight:      Height:        Intake/Output Summary (Last 24 hours) at 08/09/2024  1909 Last data filed at 08/09/2024 1716 Gross per 24 hour  Intake 1049.22 ml  Output 1120 ml  Net -70.78 ml   Filed Weights   08/06/24 2345  Weight: 79.8 kg    Examination:   Constitutional: NAD, AAOx3 HEENT: conjunctivae and lids normal, EOMI CV: No cyanosis.   RESP: normal respiratory effort, on RA Abdomen: NG tube on intermittent suctioning Extremities: TED hose on BLE   Data Reviewed: I have personally reviewed labs and imaging studies  Time spent: 35 minutes  Ellouise Haber, MD [facility] If 7PM-7AM, please contact night-coverage 08/09/2024, 7:09 PM

## 2024-08-09 NOTE — Plan of Care (Signed)

## 2024-08-09 NOTE — Progress Notes (Signed)
 08/09/2024  Subjective: No acute events.  Patient reports having flatus and BM but still feeling nauseous this morning.  Denies any abdominal pain.  KUB today with some dilated loops of small bowel still.  Vital signs: Temp:  [97.5 F (36.4 C)-98.5 F (36.9 C)] 98.3 F (36.8 C) (10/27 0500) Pulse Rate:  [78-90] 90 (10/27 0500) Resp:  [16-18] 18 (10/27 0500) BP: (130-147)/(80-87) 147/87 (10/27 0500) SpO2:  [98 %-99 %] 99 % (10/27 0500)   Intake/Output: 10/26 0701 - 10/27 0700 In: 0  Out: 1020 [Urine:200; Emesis/NG output:120] Last BM Date : 08/06/24  Physical Exam: Constitutional:  No acute distress Abdomen:  soft, mildly distended, non-tender.  NG in place with gastric content mixed with bilious.  Labs:  Recent Labs    08/07/24 0724 08/09/24 0505  WBC 6.4 7.3  HGB 10.1* 11.7*  HCT 30.8* 35.4*  PLT 285 373   Recent Labs    08/08/24 0518 08/09/24 0505  NA 139 139  K 3.2* 3.3*  CL 105 104  CO2 25 23  GLUCOSE 98 112*  BUN 11 18  CREATININE 0.97 1.25*  CALCIUM  8.0* 8.3*   No results for input(s): LABPROT, INR in the last 72 hours.  Imaging: DG Abd 2 Views Result Date: 08/09/2024 EXAM: 2 VIEW XRAY OF THE ABDOMEN 08/09/2024 05:57:00 AM COMPARISON: 08/08/2024 CLINICAL HISTORY: Small bowel obstruction. FINDINGS: LINES, TUBES AND DEVICES: Nasogastric tube remains in place with tip overlying the gastric antrum. BOWEL: Stable mildly dilated small bowel loops with air-fluid levels in the right abdomen. Bowel gas in non dilated left colon. 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 Surgical mesh in left inguinal region. No opaque urinary calculi. BONES: No acute osseous abnormality. IMPRESSION: 1. Stable mildly dilated small bowel loops with air-fluid levels in the right abdomen, suspicious for distal small bowel obstruction. Electronically signed by: Norleen Kil MD 08/09/2024 06:33 AM EDT RP Workstation: [id number]   DG Abd 1  View Result Date: 08/08/2024 CLINICAL DATA:  Nasogastric tube placement. EXAM: DG ABDOMEN 1V COMPARISON:  Radiograph yesterday.  CT 08/06/2024 FINDINGS: Tip and side port of the enteric tube below the diaphragm in the stomach. Gaseous small bowel distention in the central abdomen. Peripherally calcified structure to the right of L1 corresponds to renal artery aneurysm. Chronic elevation of right hemidiaphragm. IMPRESSION: 1. Tip and side port of the enteric tube below the diaphragm in the stomach. 2. Gaseous small bowel distention in the central abdomen. Electronically Signed   By: Andrea Gasman M.D.   On: 08/08/2024 12:06    Assessment/Plan: This is a 78 y.o. female with SBO  --Patient is having some bowel function although still with NG output and nausea.  As a precaution, will order gastrograffin challenge to help. --Otherwise continue IV fluids, NPO, NG to suction. --Repleted K this morning.   I spent 35 minutes dedicated to the care of this patient on the date of this encounter to include pre-visit review of records, face-to-face time with the patient discussing diagnosis and management, and any post-visit coordination of care.  Aloysius Sheree Plant, MD [facility]

## 2024-08-09 NOTE — Care Management Important Message (Signed)
 Important Message  Patient Details  Name: Katie Thompson MRN: [id number] Date of Birth: [birth year]   Important Message Given:  Yes - Medicare IM     Lurene Robley W, CMA 08/09/2024, 2:48 PM

## 2024-08-09 NOTE — Plan of Care (Signed)
  Problem: Fluid Volume: Goal: Ability to maintain a balanced intake and output will improve Outcome: Progressing   Problem: Health Behavior/Discharge Planning: Goal: Ability to manage health-related needs will improve Outcome: Progressing   Problem: Nutritional: Goal: Maintenance of adequate nutrition will improve Outcome: Not Progressing   Problem: Clinical Measurements: Goal: Ability to maintain clinical measurements within normal limits will improve Outcome: Progressing Goal: Respiratory complications will improve Outcome: Progressing   Problem: Activity: Goal: Risk for activity intolerance will decrease Outcome: Not Progressing   Problem: Nutrition: Goal: Adequate nutrition will be maintained Outcome: Not Progressing

## 2024-08-10 ENCOUNTER — Inpatient Hospital Stay

## 2024-08-10 DIAGNOSIS — K56609 Unspecified intestinal obstruction, unspecified as to partial versus complete obstruction: Secondary | ICD-10-CM | POA: Diagnosis not present

## 2024-08-10 DIAGNOSIS — R9389 Abnormal findings on diagnostic imaging of other specified body structures: Secondary | ICD-10-CM | POA: Diagnosis not present

## 2024-08-10 LAB — BASIC METABOLIC PANEL WITH GFR
Anion gap: 10 (ref 5–15)
BUN: 18 mg/dL (ref 8–23)
CO2: 24 mmol/L (ref 22–32)
Calcium: 8.4 mg/dL — ABNORMAL LOW (ref 8.9–10.3)
Chloride: 109 mmol/L (ref 98–111)
Creatinine, Ser: 1.02 mg/dL — ABNORMAL HIGH (ref 0.44–1.00)
GFR, Estimated: 56 mL/min — ABNORMAL LOW (ref >60)
Glucose, Bld: 102 mg/dL — ABNORMAL HIGH (ref 70–99)
Potassium: 4 mmol/L (ref 3.5–5.1)
Sodium: 143 mmol/L (ref 135–145)

## 2024-08-10 LAB — GLUCOSE, CAPILLARY
Glucose-Capillary: 100 mg/dL — ABNORMAL HIGH (ref 70–99)
Glucose-Capillary: 77 mg/dL (ref 70–99)
Glucose-Capillary: 84 mg/dL (ref 70–99)
Glucose-Capillary: 85 mg/dL (ref 70–99)
Glucose-Capillary: 92 mg/dL (ref 70–99)

## 2024-08-10 MED ORDER — KETOROLAC TROMETHAMINE 15 MG/ML IJ SOLN
15.0000 mg | Freq: Four times a day (QID) | INTRAMUSCULAR | Status: DC | PRN
  Administered 2024-08-10 – 2024-08-15 (×4): 15 mg via INTRAVENOUS
  Filled 2024-08-10 (×6): qty 1

## 2024-08-10 MED ORDER — SODIUM CHLORIDE 0.9 % IV SOLN: INTRAVENOUS | Status: AC

## 2024-08-10 MED ORDER — DEXTROSE 50 % IV SOLN
12.5000 g | INTRAVENOUS | Status: AC
  Administered 2024-08-11: 12.5 g via INTRAVENOUS
  Filled 2024-08-10: qty 50

## 2024-08-10 MED ORDER — HYDRALAZINE HCL 20 MG/ML IJ SOLN: 10.0000 mg | Freq: Four times a day (QID) | INTRAMUSCULAR | Status: DC | PRN

## 2024-08-10 MED ORDER — PROMETHAZINE HCL 25 MG/ML IJ SOLN
12.5000 mg | Freq: Four times a day (QID) | INTRAMUSCULAR | Status: DC | PRN
  Administered 2024-08-10 – 2024-08-11 (×2): 12.5 mg via INTRAVENOUS
  Filled 2024-08-10: qty 0.5
  Filled 2024-08-10: qty 12.5

## 2024-08-10 NOTE — Progress Notes (Signed)
  PROGRESS NOTE    Katie Thompson  FMW:[id number] DOB: [birth year] DOA: 08/06/2024 PCP: [name] [name], MD  [id number]  LOS: 4 days   Brief hospital course:   Assessment & Plan: Katie Thompson is a 78 y.o. African-American female with medical history significant for type 2 diabetes mellitus, GERD, hypertension and dyslipidemia, who presented to the emergency room with acute onset of diarrhea over the last couple of days which has been nonbloody and profuse with no mucus or melena.  She has been having lower abdominal cramps and nausea without vomiting.    * SBO (small bowel obstruction) (HCC) - GenSurg consulted. --unsuccessful attempts at NG tube insertion on 10/25, but GenSurg was able to insert NG tube on 10/26. --having BM and passing gas now, however, imaging still show dilated small bowel. --cont NGT intermittent low suction --cont MIVF --management per GenSurg, to determine when to start TPN  Hypokalemia Hypomag --supplement K PRN with MIVF --supplement mag with IV  Dyslipidemia --resume statin after discharge  Type 2 diabetes mellitus without complications (HCC) --BG q6h to monitor for hypoglycemia --no need for SSI  Essential hypertension --hold losartan , hydrochlorothiazide  and verapamil  since NPO --IV hydralazine PRN   DVT prophylaxis: Lovenox  SQ Code Status: Full code  Family Communication: granddaughter updated at bedside today Level of care: Telemetry Medical Dispo:   The patient is from: home Anticipated d/c is to: home Anticipated d/c date is: > 3 days   Subjective and Interval History:  Pt reported feeling nauseated because of the presence of NG tube.  Reported having BM and passing gas.  However, imaging still show dilated small bowel.   Objective: Vitals:   08/10/24 0505 08/10/24 0832 08/10/24 0958 08/10/24 1633  BP: (!) 153/63 (!) 148/77 (!) (P) 150/81 (!) 154/77  Pulse: 72 72 (P) 81 72  Resp: 17 16 (P) 17 16  Temp: 97.6 F (36.4 C) 97.7  F (36.5 C) (P) 98.1 F (36.7 C) 98.4 F (36.9 C)  TempSrc:  Oral (P) Oral   SpO2: 95% 98% (P) 100% 98%  Weight:      Height:        Intake/Output Summary (Last 24 hours) at 08/10/2024 1924 Last data filed at 08/10/2024 1700 Gross per 24 hour  Intake 946.56 ml  Output 300 ml  Net 646.56 ml   Filed Weights   08/06/24 2345  Weight: 79.8 kg    Examination:   Constitutional: NAD, alert, oriented HEENT: conjunctivae and lids normal, EOMI CV: No cyanosis.   RESP: normal respiratory effort, on RA Abdomen: NG tube present Psych: depressed mood and affect.  Appropriate judgement and reason   Data Reviewed: I have personally reviewed labs and imaging studies  Time spent: 35 minutes  Katie Haber, MD [facility] If 7PM-7AM, please contact night-coverage 08/10/2024, 7:24 PM

## 2024-08-10 NOTE — Plan of Care (Signed)

## 2024-08-10 NOTE — Progress Notes (Signed)
 [facility] SURGICAL PROGRESS NOTE (cpt [phone])  Hospital Day(s): 4.   Interval History: Patient seen and examined, no acute events or new complaints overnight. Patient reports she is doing okay. Her biggest complaint remains NGT itself. She feels that this is the source of her nausea. No abdominal pain nor distension. Renal function baseline; sCr - 1.02; UO - 400 ccs. No electrolyte derangements. NGT with 700 ccs out in last 24 hours; 100 in canister this AM. She is having bowel function; multiple BM in last 24 hours. KUB this AM with contrast throughout the colon, small bowel dilation appears improved as well however there remains a dilated loop in RUQ.   Review of Systems:  Constitutional: denies fever, chills  HEENT: denies cough or congestion  Respiratory: denies any shortness of breath  Cardiovascular: denies chest pain or palpitations  Gastrointestinal: denies abdominal pain, N/V Genitourinary: denies burning with urination or urinary frequency Musculoskeletal: denies pain, decreased motor or sensation  Vital signs in last 24 hours: [min-max] current  Temp:  [97.4 F (36.3 C)-98.1 F (36.7 C)] 97.6 F (36.4 C) (10/28 0505) Pulse Rate:  [72-90] 72 (10/28 0505) Resp:  [17-18] 17 (10/28 0505) BP: (148-153)/(63-79) 153/63 (10/28 0505) SpO2:  [95 %-99 %] 95 % (10/28 0505)     Height: 5' 3 (160 cm) Weight: 79.8 kg BMI (Calculated): 31.18   Intake/Output last 2 shifts:  10/27 0701 - 10/28 0700 In: 1718.6 [I.V.:1319.4; IV Piggyback:399.2] Out: 1100 [Urine:400; Emesis/NG output:700]   Physical Exam:  Constitutional: alert, cooperative and no distress, sitting at side of the bed  HENT: NGT in place; output slowing Eyes: PERRL, EOM's grossly intact and symmetric  Respiratory: breathing non-labored at rest  Cardiovascular: regular rate and sinus rhythm  Gastrointestinal: Soft, she does not appear tender this AM, she is no distended, no rebound.guarding. Certainly not  peritonitic.    Labs:     Latest Ref Rng & Units 08/09/2024    5:05 AM 08/07/2024    7:24 AM 08/06/2024    8:11 PM  CBC  WBC 4.0 - 10.5 K/uL 7.3  6.4  8.1   Hemoglobin 12.0 - 15.0 g/dL 88.2  89.8  87.9   Hematocrit 36.0 - 46.0 % 35.4  30.8  35.3   Platelets 150 - 400 K/uL 373  285  353       Latest Ref Rng & Units 08/10/2024    5:24 AM 08/09/2024    5:05 AM 08/08/2024    5:18 AM  CMP  Glucose 70 - 99 mg/dL 897  887  98   BUN 8 - 23 mg/dL 18  18  11    Creatinine 0.44 - 1.00 mg/dL 8.97  8.74  9.02   Sodium 135 - 145 mmol/L 143  139  139   Potassium 3.5 - 5.1 mmol/L 4.0  3.3  3.2   Chloride 98 - 111 mmol/L 109  104  105   CO2 22 - 32 mmol/L 24  23  25    Calcium  8.9 - 10.3 mg/dL 8.4  8.3  8.0     Imaging studies:   KUB (08/10/2024) personally reviewed with contrast in colon, small bowel loop in RUQ, no free air, and radiologist report reviewed below;  IMPRESSION: 1. Stable dilated small-bowel loop in the right upper quadrant, compatible with ileus. 2. Elevated right hemidiaphragm.   KUB - 8 hr (08/09/2024) personally reviewed still with dilated loops of small bowel, contrast seen in stomach/SB, and radiologist report reviewed below:  IMPRESSION:  Enteric contrast within small bowel, including dilated small bowel in the right abdomen. No enteric contrast in the colon.   Assessment/Plan: (ICD-10's: K66.609) 78 y.o. female with SBO   - Reviewed imaging findings in last 24 hours with patient. She did not pass PO contrast ito the colon at 8 hour interval, so in theory she has technically failed gastrografin challenge. However, this morning, PO contrast clearly transits throughout the colon and she is having bowel function. There does remain a loop of small bowel in RUQ which is similar in appearance/dilation. We are sort of in a grey area. I do think she has made good progression from a bowl function and there is not evidence of peritonitis/bowel compromise. We will continue to  monitor progression over the next 24 hours. She understands we may need to proceed with surgery tomorrow or sooner if she deteriorates in any fashion. Her granddaughter did inquire about doing this laparoscopically. I did explain that in the acute setting, with small bowel dilation, there is increased risk of bowel injury and often there is not enough space to adequately work laparoscopically, likely needs laparotomy.    - For now, continue NPO, IVF support. If we proceed to surgery, she will likely need TPN - Continue NGT decompression for now, monitor and record output daily - Monitor abdominal examination; on-going bowel function    - Serial KUB as needed    - Pain control prn; antiemetics prn   - Mobilize; okay to clamp NGT to allow this  - Further management per primary service; we will follow   All of the above findings and recommendations were discussed with the patient, and the medical team, and all of patient's questions were answered to her expressed satisfaction.  -- Katie Platt, PA-C [facility] 08/10/2024, 9:29 AM M-F: 7am - 4pm

## 2024-08-10 NOTE — Plan of Care (Signed)
  Problem: Metabolic: Goal: Ability to maintain appropriate glucose levels will improve Outcome: Progressing   Problem: Nutritional: Goal: Maintenance of adequate nutrition will improve Outcome: Progressing Goal: Progress toward achieving an optimal weight will improve Outcome: Progressing   Problem: Skin Integrity: Goal: Risk for impaired skin integrity will decrease Outcome: Progressing

## 2024-08-11 ENCOUNTER — Inpatient Hospital Stay

## 2024-08-11 DIAGNOSIS — K56609 Unspecified intestinal obstruction, unspecified as to partial versus complete obstruction: Secondary | ICD-10-CM | POA: Diagnosis not present

## 2024-08-11 LAB — CBC
HCT: 33.2 % — ABNORMAL LOW (ref 36.0–46.0)
Hemoglobin: 10.8 g/dL — ABNORMAL LOW (ref 12.0–15.0)
MCH: 28.9 pg (ref 26.0–34.0)
MCHC: 32.5 g/dL (ref 30.0–36.0)
MCV: 88.8 fL (ref 80.0–100.0)
Platelets: 351 K/uL (ref 150–400)
RBC: 3.74 MIL/uL — ABNORMAL LOW (ref 3.87–5.11)
RDW: 15.7 % — ABNORMAL HIGH (ref 11.5–15.5)
WBC: 9.9 K/uL (ref 4.0–10.5)
nRBC: 0 % (ref 0.0–0.2)

## 2024-08-11 LAB — GLUCOSE, CAPILLARY
Glucose-Capillary: 134 mg/dL — ABNORMAL HIGH (ref 70–99)
Glucose-Capillary: 66 mg/dL — ABNORMAL LOW (ref 70–99)
Glucose-Capillary: 73 mg/dL (ref 70–99)
Glucose-Capillary: 77 mg/dL (ref 70–99)
Glucose-Capillary: 77 mg/dL (ref 70–99)

## 2024-08-11 LAB — BASIC METABOLIC PANEL WITH GFR
Anion gap: 9 (ref 5–15)
BUN: 26 mg/dL — ABNORMAL HIGH (ref 8–23)
CO2: 23 mmol/L (ref 22–32)
Calcium: 8.6 mg/dL — ABNORMAL LOW (ref 8.9–10.3)
Chloride: 112 mmol/L — ABNORMAL HIGH (ref 98–111)
Creatinine, Ser: 1.08 mg/dL — ABNORMAL HIGH (ref 0.44–1.00)
GFR, Estimated: 52 mL/min — ABNORMAL LOW (ref >60)
Glucose, Bld: 82 mg/dL (ref 70–99)
Potassium: 4 mmol/L (ref 3.5–5.1)
Sodium: 144 mmol/L (ref 135–145)

## 2024-08-11 LAB — MAGNESIUM: Magnesium: 2.5 mg/dL — ABNORMAL HIGH (ref 1.7–2.4)

## 2024-08-11 MED ORDER — LIDOCAINE VISCOUS HCL 2 % MT SOLN
15.0000 mL | OROMUCOSAL | Status: DC | PRN
  Administered 2024-08-11: 15 mL via OROMUCOSAL
  Filled 2024-08-11 (×2): qty 15

## 2024-08-11 MED ORDER — SODIUM CHLORIDE 0.9 % IV SOLN: INTRAVENOUS | Status: AC

## 2024-08-11 MED ORDER — SODIUM CHLORIDE 0.9% FLUSH
10.0000 mL | Freq: Two times a day (BID) | INTRAVENOUS | Status: DC
  Administered 2024-08-11 – 2024-08-12 (×3): 10 mL
  Administered 2024-08-13: 30 mL
  Administered 2024-08-13 – 2024-08-16 (×6): 10 mL

## 2024-08-11 MED ORDER — SODIUM CHLORIDE 0.9% FLUSH: 10.0000 mL | INTRAVENOUS | Status: DC | PRN

## 2024-08-11 NOTE — Plan of Care (Signed)
 100cc of output through NGT tube. Problem: Education: Goal: Ability to describe self-care measures that may prevent or decrease complications (Diabetes Survival Skills Education) will improve Outcome: Progressing Goal: Individualized Educational Video(s) Outcome: Progressing   Problem: Coping: Goal: Ability to adjust to condition or change in health will improve Outcome: Progressing   Problem: Fluid Volume: Goal: Ability to maintain a balanced intake and output will improve Outcome: Progressing   Problem: Health Behavior/Discharge Planning: Goal: Ability to identify and utilize available resources and services will improve Outcome: Progressing Goal: Ability to manage health-related needs will improve Outcome: Progressing   Problem: Metabolic: Goal: Ability to maintain appropriate glucose levels will improve Outcome: Progressing   Problem: Nutritional: Goal: Maintenance of adequate nutrition will improve Outcome: Progressing Goal: Progress toward achieving an optimal weight will improve Outcome: Progressing   Problem: Skin Integrity: Goal: Risk for impaired skin integrity will decrease Outcome: Progressing   Problem: Tissue Perfusion: Goal: Adequacy of tissue perfusion will improve Outcome: Progressing   Problem: Education: Goal: Knowledge of General Education information will improve Description: Including pain rating scale, medication(s)/side effects and non-pharmacologic comfort measures Outcome: Progressing   Problem: Health Behavior/Discharge Planning: Goal: Ability to manage health-related needs will improve Outcome: Progressing   Problem: Clinical Measurements: Goal: Ability to maintain clinical measurements within normal limits will improve Outcome: Progressing Goal: Will remain free from infection Outcome: Progressing Goal: Diagnostic test results will improve Outcome: Progressing Goal: Respiratory complications will improve Outcome: Progressing Goal:  Cardiovascular complication will be avoided Outcome: Progressing   Problem: Activity: Goal: Risk for activity intolerance will decrease Outcome: Progressing   Problem: Nutrition: Goal: Adequate nutrition will be maintained Outcome: Progressing   Problem: Coping: Goal: Level of anxiety will decrease Outcome: Progressing   Problem: Elimination: Goal: Will not experience complications related to bowel motility Outcome: Progressing Goal: Will not experience complications related to urinary retention Outcome: Progressing   Problem: Pain Managment: Goal: General experience of comfort will improve and/or be controlled Outcome: Progressing   Problem: Safety: Goal: Ability to remain free from injury will improve Outcome: Progressing   Problem: Skin Integrity: Goal: Risk for impaired skin integrity will decrease Outcome: Progressing

## 2024-08-11 NOTE — Plan of Care (Signed)
  Problem: Fluid Volume: Goal: Ability to maintain a balanced intake and output will improve Outcome: Progressing   Problem: Health Behavior/Discharge Planning: Goal: Ability to identify and utilize available resources and services will improve Outcome: Progressing   Problem: Nutritional: Goal: Maintenance of adequate nutrition will improve Outcome: Progressing   Problem: Clinical Measurements: Goal: Ability to maintain clinical measurements within normal limits will improve Outcome: Progressing

## 2024-08-11 NOTE — Progress Notes (Signed)

## 2024-08-11 NOTE — Progress Notes (Signed)
 PROGRESS NOTE    Katie Thompson  FMW:[id number] DOB: 02-13-1946 DOA: 08/06/2024 PCP: [name] [name], MD   Assessment & Plan:   Principal Problem:   SBO (small bowel obstruction) (HCC) Active Problems:   Hypokalemia   Dyslipidemia   Essential hypertension   Type 2 diabetes mellitus without complications (HCC)   GERD without esophagitis  Assessment and Plan: SBO: NPO. NG tube clamped as per gen surg. Had gas & BM today. Gen surg following and recs    Hypokalemia: WNL today  Hypomagnesemia: resolved   HLD: holding statin while NPO    DM2: well controlled, HbA1c 6.3. No need for SSI currently   HTN: holding home losartan , verapmil, hydrochlorothiazide  while NPO. IV hydralazine prn   Normocytic anemia: no need for a transfusion currently   Likely AKI: Cr is labile. Avoid nephrotoxic meds   DVT prophylaxis: lovenox  Code Status: full  Family Communication: discussed pt's care w/ pt's family at bedside and answered their questions  Disposition Plan: d/c back home w/ HH   Level of care: Telemetry Medical  Status is: Inpatient Remains inpatient appropriate because: still w/ NG tube    Consultants:  Gen surg   Procedures:   Antimicrobials:   Subjective: Pt c/o fatigue   Objective: Vitals:   08/10/24 1633 08/10/24 1928 08/11/24 0432 08/11/24 0815  BP: (!) 154/77 (!) 159/83 (!) 161/79 (!) 156/76  Pulse: 72 83 73 75  Resp: 16 18 18 17   Temp: 98.4 F (36.9 C) 98.8 F (37.1 C) 98.3 F (36.8 C) 98.4 F (36.9 C)  TempSrc:  Oral Oral Oral  SpO2: 98% 99% 99% 100%  Weight:      Height:        Intake/Output Summary (Last 24 hours) at 08/11/2024 0951 Last data filed at 08/11/2024 0300 Gross per 24 hour  Intake 978.1 ml  Output 200 ml  Net 778.1 ml   Filed Weights   08/06/24 2345  Weight: 79.8 kg    Examination:  General exam: Appears calm and comfortable  Respiratory system: Clear to auscultation. Respiratory effort normal. Cardiovascular system: S1  & S2 +. No rubs, gallops or clicks.  Gastrointestinal system: Abdomen is nondistended, soft and nontender. Hypoactive bowel sounds heard. Central nervous system: Alert and oriented. Moves all extremities Psychiatry: Judgement and insight appear at baseline. Flat mood and affect   Data Reviewed: I have personally reviewed following labs and imaging studies  CBC: Recent Labs  Lab 08/06/24 2011 08/07/24 0724 08/09/24 0505 08/11/24 0406  WBC 8.1 6.4 7.3 9.9  HGB 12.0 10.1* 11.7* 10.8*  HCT 35.3* 30.8* 35.4* 33.2*  MCV 84.0 85.8 86.6 88.8  PLT 353 285 373 351   Basic Metabolic Panel: Recent Labs  Lab 08/06/24 2010 08/06/24 2011 08/07/24 0724 08/08/24 0518 08/09/24 0505 08/10/24 0524 08/11/24 0406  NA  --    < > 137 139 139 143 144  K  --    < > 2.9* 3.2* 3.3* 4.0 4.0  CL  --    < > 101 105 104 109 112*  CO2  --    < > 24 25 23 24 23   GLUCOSE  --    < > 95 98 112* 102* 82  BUN  --    < > 8 11 18 18  26*  CREATININE  --    < > 1.04* 0.97 1.25* 1.02* 1.08*  CALCIUM   --    < > 7.5* 8.0* 8.3* 8.4* 8.6*  MG 1.8  --  1.6* 2.3 2.6*  --  2.5*  PHOS 2.5  --   --   --   --   --   --    < > = values in this interval not displayed.   GFR: Estimated Creatinine Clearance: 41.6 mL/min (A) (by C-G formula based on SCr of 1.08 mg/dL (H)). Liver Function Tests: Recent Labs  Lab 08/06/24 2011  AST 21  ALT 15  ALKPHOS 53  BILITOT 0.8  PROT 7.4  ALBUMIN 3.4*   Recent Labs  Lab 08/06/24 2011  LIPASE 19   No results for input(s): AMMONIA in the last 168 hours. Coagulation Profile: No results for input(s): INR, PROTIME in the last 168 hours. Cardiac Enzymes: No results for input(s): CKTOTAL, CKMB, CKMBINDEX, TROPONINI in the last 168 hours. BNP (last 3 results) No results for input(s): PROBNP in the last 8760 hours. HbA1C: No results for input(s): HGBA1C in the last 72 hours. CBG: Recent Labs  Lab 08/10/24 1252 08/10/24 1732 08/10/24 1734 08/11/24 0034  08/11/24 0636  GLUCAP 84 77 85 77 73   Lipid Profile: No results for input(s): CHOL, HDL, LDLCALC, TRIG, CHOLHDL, LDLDIRECT in the last 72 hours. Thyroid Function Tests: No results for input(s): TSH, T4TOTAL, FREET4, T3FREE, THYROIDAB in the last 72 hours. Anemia Panel: No results for input(s): VITAMINB12, FOLATE, FERRITIN, TIBC, IRON, RETICCTPCT in the last 72 hours. Sepsis Labs: Recent Labs  Lab 08/07/24 0122 08/07/24 0724  LATICACIDVEN 2.8* 1.3    Recent Results (from the past 240 hours)  Resp panel by RT-PCR (RSV, Flu A&B, Covid) Anterior Nasal Swab     Status: None   Collection Time: 08/07/24  2:38 AM   Specimen: Anterior Nasal Swab  Result Value Ref Range Status   SARS Coronavirus 2 by RT PCR NEGATIVE NEGATIVE Final    Comment: (NOTE) SARS-CoV-2 target nucleic acids are NOT DETECTED.  The SARS-CoV-2 RNA is generally detectable in upper respiratory specimens during the acute phase of infection. The lowest concentration of SARS-CoV-2 viral copies this assay can detect is 138 copies/mL. A negative result does not preclude SARS-Cov-2 infection and should not be used as the sole basis for treatment or other patient management decisions. A negative result may occur with  improper specimen collection/handling, submission of specimen other than nasopharyngeal swab, presence of viral mutation(s) within the areas targeted by this assay, and inadequate number of viral copies(<138 copies/mL). A negative result must be combined with clinical observations, patient history, and epidemiological information. The expected result is Negative.  Fact Sheet for Patients:  bloggercourse.com  Fact Sheet for Healthcare Providers:  seriousbroker.it  This test is no t yet approved or cleared by the [place]  FDA and  has been authorized for detection and/or diagnosis of SARS-CoV-2 by FDA under an  Emergency Use Authorization (EUA). This EUA will remain  in effect (meaning this test can be used) for the duration of the COVID-19 declaration under Section 564(b)(1) of the Act, 21 U.S.C.section 360bbb-3(b)(1), unless the authorization is terminated  or revoked sooner.       Influenza A by PCR NEGATIVE NEGATIVE Final   Influenza B by PCR NEGATIVE NEGATIVE Final    Comment: (NOTE) The Xpert Xpress SARS-CoV-2/FLU/RSV plus assay is intended as an aid in the diagnosis of influenza from Nasopharyngeal swab specimens and should not be used as a sole basis for treatment. Nasal washings and aspirates are unacceptable for Xpert Xpress SARS-CoV-2/FLU/RSV testing.  Fact Sheet for Patients: bloggercourse.com  Fact Sheet for Healthcare Providers:  seriousbroker.it  This test is not yet approved or cleared by the [place]  FDA and has been authorized for detection and/or diagnosis of SARS-CoV-2 by FDA under an Emergency Use Authorization (EUA). This EUA will remain in effect (meaning this test can be used) for the duration of the COVID-19 declaration under Section 564(b)(1) of the Act, 21 U.S.C. section 360bbb-3(b)(1), unless the authorization is terminated or revoked.     Resp Syncytial Virus by PCR NEGATIVE NEGATIVE Final    Comment: (NOTE) Fact Sheet for Patients: bloggercourse.com  Fact Sheet for Healthcare Providers: seriousbroker.it  This test is not yet approved or cleared by the [place]  FDA and has been authorized for detection and/or diagnosis of SARS-CoV-2 by FDA under an Emergency Use Authorization (EUA). This EUA will remain in effect (meaning this test can be used) for the duration of the COVID-19 declaration under Section 564(b)(1) of the Act, 21 U.S.C. section 360bbb-3(b)(1), unless the authorization is terminated or revoked.  Performed at [facility], [address] [place], [place] [address]          Radiology Studies: DG ABD ACUTE 2+V W 1V CHEST Result Date: 08/10/2024 EXAM: UPRIGHT AND SUPINE XRAY VIEWS OF THE ABDOMEN AND 4 VIEW(S) OF THE CHEST 08/10/2024 08:12:00 AM COMPARISON: Comparison yesterday, 08/09/2024. CLINICAL HISTORY: SBO (small bowel obstruction) (HCC) [id number]. Small bowel obstruction. FINDINGS: LUNGS AND PLEURA: Elevated right hemidiaphragm is noted. No consolidation or pulmonary edema. No pleural effusion or pneumothorax. HEART AND MEDIASTINUM: No acute abnormality of the cardiac and mediastinal silhouettes. BOWEL: Stable dilated small bowel loop is noted in right upper quadrant suggesting adynamic ileus. Residual contrast is noted in nondilated colon. No bowel obstruction. PERITONEUM AND SOFT TISSUES: No abnormal calcifications. No free air. BONES: No acute osseous abnormality. LINES AND TUBES: Nasogastric tube tip is seen in expected position of stomach. IMPRESSION: 1. Stable dilated small-bowel loop in the right upper quadrant, compatible with ileus. 2. Elevated right hemidiaphragm. Electronically signed by: Lynwood Seip MD 08/10/2024 08:37 AM EDT RP Workstation: [id number]   DG Abd Portable 1V-Small Bowel Obstruction Protocol-initial, 8 hr delay Result Date: 08/09/2024 CLINICAL DATA:  Small bowel obstruction, 8 hour delay. EXAM: PORTABLE ABDOMEN - 1 VIEW COMPARISON:  Radiograph earlier today. FINDINGS: Enteric contrast within small bowel, with dilated small bowel in the right abdomen. There is no enteric contrast in the colon. Small amount of contrast persists within the stomach. Enteric tube in place. Tacks from prior left abdominal hernia repair. IMPRESSION: Enteric contrast within small bowel, including dilated small bowel in the right abdomen. No enteric contrast in the colon. Electronically Signed   By: Andrea Gasman M.D.   On: 08/09/2024 21:32   DG Abd 1 View Result Date: 08/09/2024 CLINICAL DATA:   Nasogastric tube placement. EXAM: ABDOMEN - 1 VIEW COMPARISON:  Radiograph earlier today FINDINGS: Tip of the enteric tube is in the stomach, the side port is not well demonstrated due to overlying enteric contrast. Contrast opacifies the stomach. Some dilute contrast is seen within small bowel in the right abdomen. Air-fluid levels within dilated small bowel. IMPRESSION: 1. Tip of the enteric tube in the stomach, the side port is not well demonstrated due to overlying enteric contrast. 2. Dilated small bowel with air-fluid levels consistent with small bowel obstruction. Enteric contrast in the stomach and proximal small bowel. Electronically Signed   By: Andrea Gasman M.D.   On: 08/09/2024 15:40        Scheduled Meds:  dextrose   12.5 g  Intravenous STAT   dorzolamide -timolol   1 drop Both Eyes BID   enoxaparin  (LOVENOX ) injection  0.5 mg/kg Subcutaneous Q24H   losartan   100 mg Oral Daily   And   hydrochlorothiazide   25 mg Oral Daily   latanoprost   1 drop Left Eye QHS   pantoprazole  (PROTONIX ) IV  40 mg Intravenous Daily   tamoxifen   20 mg Oral Daily   verapamil   240 mg Oral Daily   Continuous Infusions:  sodium chloride  75 mL/hr at 08/11/24 0329   promethazine (PHENERGAN) injection (IM or IVPB) 12.5 mg (08/10/24 1454)     LOS: 5 days        Anthony CHRISTELLA Pouch, MD [facility] Pager [phone]  If 7PM-7AM, please contact night-coverage [email or web address] 08/11/2024, 9:51 AM

## 2024-08-11 NOTE — TOC Initial Note (Signed)
 Transition of Care [facility]) - Initial/Assessment Note    Patient Details  Name: Katie Thompson MRN: [id number] Date of Birth: 15-Jan-1946  [facility] CM/SW Contact:    Alvaro Louder, LCSW Phone Number: 08/11/2024, 1:46 PM  Clinical Narrative:       Per Chart review patient from home. PCP is Norleen Rower. LCSWA Faxed out information to [facility] agencies in [place]. Patient selected HH agency [facility].   TOC to follow for discharge       Patient Goals and CMS Choice            Expected Discharge Plan and Services                                              Prior Living Arrangements/Services                       Activities of Daily Living   ADL Screening (condition at time of admission) Independently performs ADLs?: Yes (appropriate for developmental age) Is the patient deaf or have difficulty hearing?: No Does the patient have difficulty seeing, even when wearing glasses/contacts?: No Does the patient have difficulty concentrating, remembering, or making decisions?: No  Permission Sought/Granted                  Emotional Assessment              Admission diagnosis:  Hypokalemia [E87.6] SBO (small bowel obstruction) (HCC) [K56.609] Uterine mass [N85.8] Unilateral femoral hernia without obstruction or gangrene, recurrence not specified [K41.90] Abdominal pain, unspecified abdominal location [R10.9] Patient Active Problem List   Diagnosis Date Noted   Dyslipidemia 08/07/2024   Essential hypertension 08/07/2024   GERD without esophagitis 08/07/2024   Type 2 diabetes mellitus without complications (HCC) 08/07/2024   SBO (small bowel obstruction) (HCC) 08/06/2024   Hypokalemia 07/19/2022   Acute diverticulitis 09/29/2021   Atypical ductal hyperplasia, breast 06/07/2021   Ductal carcinoma in situ (DCIS) of left breast 05/19/2017   Vitamin D  deficiency 01/08/2017   Lichen 12/06/2014   Osteoporosis 08/14/2014   Anemia,  unspecified 01/17/2014   Benign essential hypertension 01/17/2014   Diabetes mellitus type 2, controlled (HCC) 01/17/2014   Neck pain 01/17/2014   Pure hypercholesterolemia 01/17/2014   PCP:  Rower Norleen [name], MD Pharmacy:   [facility] [phone], [place] - [address] AT [facility] OF [address] [place] [address] Phone: [phone] Fax: [phone]     Social Drivers of Health (SDOH) Social History: SDOH Screenings   Food Insecurity: No Food Insecurity (08/07/2024)  Housing: Low Risk  (08/07/2024)  Transportation Needs: No Transportation Needs (08/07/2024)  Utilities: Not At Risk (08/07/2024)  Depression (PHQ2-9): Low Risk  (04/15/2024)  Financial Resource Strain: Low Risk  (04/26/2024)   Received from [facility]  Social Connections: Unknown (08/07/2024)  Tobacco Use: Low Risk  (08/06/2024)   SDOH Interventions:     Readmission Risk Interventions     No data to display

## 2024-08-11 NOTE — Evaluation (Signed)
 Physical Therapy Evaluation Patient Details Name: Katie Thompson MRN: [id number] DOB: 05/23/1946 Today's Date: 08/11/2024  History of Present Illness  Katie Thompson is a 78 y.o. African-American female with medical history significant for type 2 diabetes mellitus, GERD, hypertension and dyslipidemia, who presented to the emergency room with acute onset of diarrhea over the last couple of days which has been nonbloody and profuse with no mucus or melena.  She has been having lower abdominal cramps and nausea without vomiting.  No dysuria, oliguria or hematuria or flank pain.  No chest pain or palpitations.  No cough or wheezing or dyspnea.  She has been feeling generally weak.  Clinical Impression  Patient seen for initial PT evaluation due to decline in functional status. Patient is A&O x 4. Baseline mobility reported as independent and no recent falls, currently requiring CGA/supervision for transfers. Gait assessed with CGA/supervision 100 feet, limited by weakness associated with diet restrictions. Pt lives alone with PRN assistance available with daughter. Clinical impression: patient presents with moderate mobility limitations secondary to acute medical event. Recommend skilled PT to address safety, mobility, and discharge planning. PT recommend HHPT at this time.     If plan is discharge home, recommend the following: A little help with walking and/or transfers;A little help with bathing/dressing/bathroom   Can travel by private vehicle        Equipment Recommendations Rolling walker (2 wheels);BSC/3in1  Recommendations for Other Services       Functional Status Assessment Patient has had a recent decline in their functional status and demonstrates the ability to make significant improvements in function in a reasonable and predictable amount of time.     Precautions / Restrictions Precautions Precautions: Fall Restrictions Weight Bearing Restrictions Per Provider Order: No       Mobility  Bed Mobility Overal bed mobility: Needs Assistance Bed Mobility: Sidelying to Sit, Rolling Rolling: Contact guard assist Sidelying to sit: Contact guard assist            Transfers Overall transfer level: Needs assistance Equipment used: Rolling walker (2 wheels) Transfers: Sit to/from Stand Sit to Stand: Contact guard assist, Min assist                Ambulation/Gait Ambulation/Gait assistance: Contact guard assist, Min assist Gait Distance (Feet): 100 Feet Assistive device: Rolling walker (2 wheels) Gait Pattern/deviations: Step-through pattern, Trunk flexed, Shuffle, Decreased stride length, Narrow base of support       General Gait Details: mild unsteadiness; increase RR at the end of ambulation bout of 100 feet  Stairs            Wheelchair Mobility     Tilt Bed    Modified Rankin (Stroke Patients Only)       Balance Overall balance assessment: Needs assistance Sitting-balance support: Feet supported Sitting balance-Leahy Scale: Good     Standing balance support: During functional activity, Reliant on assistive device for balance Standing balance-Leahy Scale: Fair                               Pertinent Vitals/Pain Pain Assessment Pain Assessment: No/denies pain    Home Living Family/patient expects to be discharged to:: Private residence Living Arrangements: Spouse/significant other Available Help at Discharge: Family Type of Home: Mobile home Home Access: Stairs to enter Entrance Stairs-Rails: Right;Left;Can reach both Entrance Stairs-Number of Steps: 4   Home Layout: One level Home Equipment: None  Prior Function Prior Level of Function : Independent/Modified Independent             Mobility Comments: completely independent for all mobility ADLs Comments: all ADL/iADLs independent     Extremity/Trunk Assessment        Lower Extremity Assessment Lower Extremity Assessment: Generalized  weakness    Cervical / Trunk Assessment Cervical / Trunk Assessment: Kyphotic  Communication   Communication Communication: No apparent difficulties    Cognition Arousal: Alert Behavior During Therapy: WFL for tasks assessed/performed   PT - Cognitive impairments: No apparent impairments                         Following commands: Intact       Cueing Cueing Techniques: Verbal cues     General Comments      Exercises Other Exercises Other Exercises: Pt educated on role of PT in acute and importance of exercise at home; educated on recommended   Assessment/Plan    PT Assessment Patient needs continued PT services  PT Problem List Decreased strength;Decreased activity tolerance;Decreased mobility;Decreased balance;Decreased coordination       PT Treatment Interventions DME instruction;Gait training;Stair training;Functional mobility training;Therapeutic activities;Therapeutic exercise;Patient/family education;Neuromuscular re-education;Balance training    PT Goals (Current goals can be found in the Care Plan section)  Acute Rehab PT Goals Patient Stated Goal: pt wants to go back home PT Goal Formulation: With patient Time For Goal Achievement: 08/28/24 Potential to Achieve Goals: Good    Frequency Min 2X/week     Co-evaluation               AM-PAC PT 6 Clicks Mobility  Outcome Measure Help needed turning from your back to your side while in a flat bed without using bedrails?: None Help needed moving from lying on your back to sitting on the side of a flat bed without using bedrails?: A Little Help needed moving to and from a bed to a chair (including a wheelchair)?: A Little Help needed standing up from a chair using your arms (e.g., wheelchair or bedside chair)?: A Little Help needed to walk in hospital room?: A Little Help needed climbing 3-5 steps with a railing? : A Lot 6 Click Score: 18    End of Session Equipment Utilized During  Treatment: Gait belt Activity Tolerance: Patient tolerated treatment well Patient left: in bed;with bed alarm set;with family/visitor present   PT Visit Diagnosis: Unsteadiness on feet (R26.81);Difficulty in walking, not elsewhere classified (R26.2)    Time: 9164-9146 PT Time Calculation (min) (ACUTE ONLY): 18 min   Charges:   PT Evaluation $PT Eval Low Complexity: 1 Low   PT General Charges $$ ACUTE PT VISIT: 1 Visit         Sherlean Lesches DPT, PT   Molleigh Huot A [place] 08/11/2024, 9:08 AM

## 2024-08-11 NOTE — Progress Notes (Signed)
 [facility] SURGICAL PROGRESS NOTE (cpt [phone])  Hospital Day(s): 5.   Interval History: Patient seen and examined, no acute events or new complaints overnight. Patient reports she is feeling significantly better. She is up to the side of the bed; brighter spirits. No abdominal pain, nausea, emesis. She remains without leukocytosis; WBC 9.9K. Hgb to 10.8. Renal function stable; sCr - 1.08; UO - unmeasured. Mild hypermagnesemia 2.5. NGT output low; 200 ccs. KUB this morning continues to have contrast throughout the colon. She is having bowel function   Review of Systems:  Constitutional: denies fever, chills  HEENT: denies cough or congestion  Respiratory: denies any shortness of breath  Cardiovascular: denies chest pain or palpitations  Gastrointestinal: denies abdominal pain, N/V Genitourinary: denies burning with urination or urinary frequency Musculoskeletal: denies pain, decreased motor or sensation  Vital signs in last 24 hours: [min-max] current  Temp:  [98.1 F (36.7 C)-98.8 F (37.1 C)] 98.4 F (36.9 C) (10/29 0815) Pulse Rate:  [72-83] 75 (10/29 0815) Resp:  [16-18] 17 (10/29 0815) BP: (154-161)/(76-83) 156/76 (10/29 0815) SpO2:  [98 %-100 %] 100 % (10/29 0815)     Height: 5' 3 (160 cm) Weight: 79.8 kg BMI (Calculated): 31.18   Intake/Output last 2 shifts:  10/28 0701 - 10/29 0700 In: 978.1 [I.V.:928.1; IV Piggyback:50] Out: 200 [Emesis/NG output:200]   Physical Exam:  Constitutional: alert, cooperative and no distress, sitting at side of the bed  HENT: NGT in place; output slowing (clamped) Eyes: PERRL, EOM's grossly intact and symmetric  Respiratory: breathing non-labored at rest  Cardiovascular: regular rate and sinus rhythm  Gastrointestinal: Soft, she does not appear tender this AM, she is no distended, no rebound.guarding. Certainly not peritonitic.    Labs:     Latest Ref Rng & Units 08/11/2024    4:06 AM 08/09/2024    5:05 AM 08/07/2024     7:24 AM  CBC  WBC 4.0 - 10.5 K/uL 9.9  7.3  6.4   Hemoglobin 12.0 - 15.0 g/dL 89.1  88.2  89.8   Hematocrit 36.0 - 46.0 % 33.2  35.4  30.8   Platelets 150 - 400 K/uL 351  373  285       Latest Ref Rng & Units 08/11/2024    4:06 AM 08/10/2024    5:24 AM 08/09/2024    5:05 AM  CMP  Glucose 70 - 99 mg/dL 82  897  887   BUN 8 - 23 mg/dL 26  18  18    Creatinine 0.44 - 1.00 mg/dL 8.91  8.97  8.74   Sodium 135 - 145 mmol/L 144  143  139   Potassium 3.5 - 5.1 mmol/L 4.0  4.0  3.3   Chloride 98 - 111 mmol/L 112  109  104   CO2 22 - 32 mmol/L 23  24  23    Calcium  8.9 - 10.3 mg/dL 8.6  8.4  8.3     Imaging studies:   KUB (08/11/2024) personally reviewed with contrast in colon, no free air, and radiologist report pending...   Assessment/Plan: (ICD-10's: K7.609) 78 y.o. female with SBO   - KUB remains reassuring, NGT output low, and she is having bowel function. We will plan for 4 hour NGT clamping trial. If residuals are <150 ccs, we can remove this and initiate diet.   - For now, continue NPO, IVF support.  - No need for surgical intervention currently  - Monitor abdominal examination; on-going bowel function    - Pain  control prn; antiemetics prn   - Mobilize  - Further management per primary service; we will follow   All of the above findings and recommendations were discussed with the patient, patient's family at bedside and on the phone, and the medical team, and all of patient's questions were answered to her expressed satisfaction.  -- Arthea Platt, PA-C [facility] 08/11/2024, 9:07 AM M-F: 7am - 4pm

## 2024-08-11 NOTE — Progress Notes (Signed)
 Mobility Specialist Progress Note:    08/11/24 1338  Mobility  Activity Ambulated with assistance  Level of Assistance Contact guard assist, steadying assist  Assistive Device Front wheel walker  Distance Ambulated (ft) 160 ft  Range of Motion/Exercises Active;All extremities  Activity Response Tolerated well  Mobility visit 1 Mobility  Mobility Specialist Start Time (ACUTE ONLY) 1321  Mobility Specialist Stop Time (ACUTE ONLY) 1338  Mobility Specialist Time Calculation (min) (ACUTE ONLY) 17 min   Pt received in chair, agreeable to mobility. Required CGA to stand and ambulate with RW. Tolerated well, asx throughout. Returned to room, left in chair. Belongings in reach, all needs met.  Sherrilee Ditty Mobility Specialist Please contact via Special Educational Needs Teacher or  Rehab office at [phone]

## 2024-08-12 DIAGNOSIS — K56609 Unspecified intestinal obstruction, unspecified as to partial versus complete obstruction: Secondary | ICD-10-CM | POA: Diagnosis not present

## 2024-08-12 LAB — MAGNESIUM: Magnesium: 2.2 mg/dL (ref 1.7–2.4)

## 2024-08-12 LAB — BASIC METABOLIC PANEL WITH GFR
Anion gap: 10 (ref 5–15)
BUN: 24 mg/dL — ABNORMAL HIGH (ref 8–23)
CO2: 22 mmol/L (ref 22–32)
Calcium: 8.4 mg/dL — ABNORMAL LOW (ref 8.9–10.3)
Chloride: 112 mmol/L — ABNORMAL HIGH (ref 98–111)
Creatinine, Ser: 1.04 mg/dL — ABNORMAL HIGH (ref 0.44–1.00)
GFR, Estimated: 54 mL/min — ABNORMAL LOW (ref >60)
Glucose, Bld: 95 mg/dL (ref 70–99)
Potassium: 3.7 mmol/L (ref 3.5–5.1)
Sodium: 144 mmol/L (ref 135–145)

## 2024-08-12 LAB — CBC
HCT: 32.2 % — ABNORMAL LOW (ref 36.0–46.0)
Hemoglobin: 10.9 g/dL — ABNORMAL LOW (ref 12.0–15.0)
MCH: 29.1 pg (ref 26.0–34.0)
MCHC: 33.9 g/dL (ref 30.0–36.0)
MCV: 85.9 fL (ref 80.0–100.0)
Platelets: 312 K/uL (ref 150–400)
RBC: 3.75 MIL/uL — ABNORMAL LOW (ref 3.87–5.11)
RDW: 15.5 % (ref 11.5–15.5)
WBC: 11.2 K/uL — ABNORMAL HIGH (ref 4.0–10.5)
nRBC: 0 % (ref 0.0–0.2)

## 2024-08-12 LAB — GLUCOSE, CAPILLARY
Glucose-Capillary: 100 mg/dL — ABNORMAL HIGH (ref 70–99)
Glucose-Capillary: 101 mg/dL — ABNORMAL HIGH (ref 70–99)
Glucose-Capillary: 123 mg/dL — ABNORMAL HIGH (ref 70–99)
Glucose-Capillary: 98 mg/dL (ref 70–99)

## 2024-08-12 LAB — PHOSPHORUS: Phosphorus: 2.5 mg/dL (ref 2.5–4.6)

## 2024-08-12 MED ORDER — ATORVASTATIN CALCIUM 10 MG PO TABS
10.0000 mg | ORAL_TABLET | ORAL | Status: DC
  Administered 2024-08-13 – 2024-08-14 (×2): 10 mg via ORAL
  Filled 2024-08-12 (×4): qty 1

## 2024-08-12 MED ORDER — SCOPOLAMINE 1 MG/3DAYS TD PT72
1.0000 | MEDICATED_PATCH | TRANSDERMAL | Status: DC
  Administered 2024-08-12 – 2024-08-15 (×2): 1 mg via TRANSDERMAL
  Filled 2024-08-12 (×2): qty 1

## 2024-08-12 MED ORDER — METOCLOPRAMIDE HCL 5 MG/ML IJ SOLN: 10.0000 mg | Freq: Four times a day (QID) | INTRAMUSCULAR | Status: DC | PRN

## 2024-08-12 MED ORDER — PANTOPRAZOLE SODIUM 40 MG PO TBEC
40.0000 mg | DELAYED_RELEASE_TABLET | Freq: Every day | ORAL | Status: DC
  Administered 2024-08-13: 40 mg via ORAL
  Filled 2024-08-12: qty 1

## 2024-08-12 NOTE — Progress Notes (Signed)
 PROGRESS NOTE    Katie Thompson  FMW:[id number] DOB: 21-Mar-1946 DOA: 08/06/2024 PCP: [name] [name], MD   Assessment & Plan:   Principal Problem:   SBO (small bowel obstruction) (HCC) Active Problems:   Hypokalemia   Dyslipidemia   Essential hypertension   Type 2 diabetes mellitus without complications (HCC)   GERD without esophagitis  Assessment and Plan: SBO: diet advanced to full liquid today as per gen surg. Has had BMs. C/o intermittent nausea. Zofran  prn for nausea. Gen surg following and recs apprec    Hypokalemia:  WNL today   Hypomagnesemia: WNL today    HLD: restart home dose of statin   DM2: well controlled, HbA1c 6.3. No need for SSI currently   HTN: continue on home dose of verapamil , losartan , hydrochlorothiazide . IV hydralazine prn   Normocytic anemia: H&H are stable. No need for a transfusion currently  Likely AKI: Cr is labile. Avoid nephrotoxic meds    DVT prophylaxis: lovenox  Code Status: full  Family Communication:  Disposition Plan: d/c back home w/ HH   Level of care: Telemetry Medical  Status is: Inpatient Remains inpatient appropriate because: c/o nausea    Consultants:  Gen surg   Procedures:   Antimicrobials:   Subjective: Pt c/o fatigue   Objective: Vitals:   08/11/24 1638 08/11/24 1933 08/12/24 0252 08/12/24 0738  BP: 136/69 (!) 148/66 (!) 160/80 (!) 175/80  Pulse: (!) 58 62 63 62  Resp: 16 18 (!) 21 16  Temp: 98.9 F (37.2 C) 98.5 F (36.9 C) 98.2 F (36.8 C) (!) 97.4 F (36.3 C)  TempSrc:  Oral Oral   SpO2: 97% 99% 96% 98%  Weight:      Height:        Intake/Output Summary (Last 24 hours) at 08/12/2024 0815 Last data filed at 08/12/2024 0744 Gross per 24 hour  Intake 50 ml  Output 100 ml  Net -50 ml   Filed Weights   08/06/24 2345  Weight: 79.8 kg    Examination:  General exam: appears comfortable   Respiratory system: clear breath sounds b/l Cardiovascular system: S1/S2+. No rubs or gallops.   Gastrointestinal system: abd is soft, NT, obese & normal bowel sounds Central nervous system:alert & oriented. Moves all extremities Psychiatry: Judgement and insight appears at baseline. Flat mood and affect    Data Reviewed: I have personally reviewed following labs and imaging studies  CBC: Recent Labs  Lab 08/06/24 2011 08/07/24 0724 08/09/24 0505 08/11/24 0406 08/12/24 0451  WBC 8.1 6.4 7.3 9.9 11.2*  HGB 12.0 10.1* 11.7* 10.8* 10.9*  HCT 35.3* 30.8* 35.4* 33.2* 32.2*  MCV 84.0 85.8 86.6 88.8 85.9  PLT 353 285 373 351 312   Basic Metabolic Panel: Recent Labs  Lab 08/06/24 2010 08/06/24 2011 08/07/24 0724 08/08/24 0518 08/09/24 0505 08/10/24 0524 08/11/24 0406 08/12/24 0451  NA  --    < > 137 139 139 143 144 144  K  --    < > 2.9* 3.2* 3.3* 4.0 4.0 3.7  CL  --    < > 101 105 104 109 112* 112*  CO2  --    < > 24 25 23 24 23 22   GLUCOSE  --    < > 95 98 112* 102* 82 95  BUN  --    < > 8 11 18 18  26* 24*  CREATININE  --    < > 1.04* 0.97 1.25* 1.02* 1.08* 1.04*  CALCIUM   --    < >  7.5* 8.0* 8.3* 8.4* 8.6* 8.4*  MG 1.8  --  1.6* 2.3 2.6*  --  2.5* 2.2  PHOS 2.5  --   --   --   --   --   --  2.5   < > = values in this interval not displayed.   GFR: Estimated Creatinine Clearance: 43.2 mL/min (A) (by C-G formula based on SCr of 1.04 mg/dL (H)). Liver Function Tests: Recent Labs  Lab 08/06/24 2011  AST 21  ALT 15  ALKPHOS 53  BILITOT 0.8  PROT 7.4  ALBUMIN 3.4*   Recent Labs  Lab 08/06/24 2011  LIPASE 19   No results for input(s): AMMONIA in the last 168 hours. Coagulation Profile: No results for input(s): INR, PROTIME in the last 168 hours. Cardiac Enzymes: No results for input(s): CKTOTAL, CKMB, CKMBINDEX, TROPONINI in the last 168 hours. BNP (last 3 results) No results for input(s): PROBNP in the last 8760 hours. HbA1C: No results for input(s): HGBA1C in the last 72 hours. CBG: Recent Labs  Lab 08/11/24 1205 08/11/24 1239  08/11/24 1733 08/12/24 0019 08/12/24 0517  GLUCAP 66* 134* 77 98 100*   Lipid Profile: No results for input(s): CHOL, HDL, LDLCALC, TRIG, CHOLHDL, LDLDIRECT in the last 72 hours. Thyroid Function Tests: No results for input(s): TSH, T4TOTAL, FREET4, T3FREE, THYROIDAB in the last 72 hours. Anemia Panel: No results for input(s): VITAMINB12, FOLATE, FERRITIN, TIBC, IRON, RETICCTPCT in the last 72 hours. Sepsis Labs: Recent Labs  Lab 08/07/24 0122 08/07/24 0724  LATICACIDVEN 2.8* 1.3    Recent Results (from the past 240 hours)  Resp panel by RT-PCR (RSV, Flu A&B, Covid) Anterior Nasal Swab     Status: None   Collection Time: 08/07/24  2:38 AM   Specimen: Anterior Nasal Swab  Result Value Ref Range Status   SARS Coronavirus 2 by RT PCR NEGATIVE NEGATIVE Final    Comment: (NOTE) SARS-CoV-2 target nucleic acids are NOT DETECTED.  The SARS-CoV-2 RNA is generally detectable in upper respiratory specimens during the acute phase of infection. The lowest concentration of SARS-CoV-2 viral copies this assay can detect is 138 copies/mL. A negative result does not preclude SARS-Cov-2 infection and should not be used as the sole basis for treatment or other patient management decisions. A negative result may occur with  improper specimen collection/handling, submission of specimen other than nasopharyngeal swab, presence of viral mutation(s) within the areas targeted by this assay, and inadequate number of viral copies(<138 copies/mL). A negative result must be combined with clinical observations, patient history, and epidemiological information. The expected result is Negative.  Fact Sheet for Patients:  bloggercourse.com  Fact Sheet for Healthcare Providers:  seriousbroker.it  This test is no t yet approved or cleared by the [place]  FDA and  has been authorized for detection and/or diagnosis of  SARS-CoV-2 by FDA under an Emergency Use Authorization (EUA). This EUA will remain  in effect (meaning this test can be used) for the duration of the COVID-19 declaration under Section 564(b)(1) of the Act, 21 U.S.C.section 360bbb-3(b)(1), unless the authorization is terminated  or revoked sooner.       Influenza A by PCR NEGATIVE NEGATIVE Final   Influenza B by PCR NEGATIVE NEGATIVE Final    Comment: (NOTE) The Xpert Xpress SARS-CoV-2/FLU/RSV plus assay is intended as an aid in the diagnosis of influenza from Nasopharyngeal swab specimens and should not be used as a sole basis for treatment. Nasal washings and aspirates are unacceptable for Xpert Xpress  SARS-CoV-2/FLU/RSV testing.  Fact Sheet for Patients: bloggercourse.com  Fact Sheet for Healthcare Providers: seriousbroker.it  This test is not yet approved or cleared by the [place]  FDA and has been authorized for detection and/or diagnosis of SARS-CoV-2 by FDA under an Emergency Use Authorization (EUA). This EUA will remain in effect (meaning this test can be used) for the duration of the COVID-19 declaration under Section 564(b)(1) of the Act, 21 U.S.C. section 360bbb-3(b)(1), unless the authorization is terminated or revoked.     Resp Syncytial Virus by PCR NEGATIVE NEGATIVE Final    Comment: (NOTE) Fact Sheet for Patients: bloggercourse.com  Fact Sheet for Healthcare Providers: seriousbroker.it  This test is not yet approved or cleared by the [place]  FDA and has been authorized for detection and/or diagnosis of SARS-CoV-2 by FDA under an Emergency Use Authorization (EUA). This EUA will remain in effect (meaning this test can be used) for the duration of the COVID-19 declaration under Section 564(b)(1) of the Act, 21 U.S.C. section 360bbb-3(b)(1), unless the authorization is terminated  or revoked.  Performed at [facility], [address], [place], [place] [address]          Radiology Studies: DG ABD ACUTE 2+V W 1V CHEST Result Date: 08/11/2024 CLINICAL DATA:  Follow-up small bowel obstruction. EXAM: DG ABDOMEN ACUTE WITH 1 VIEW CHEST COMPARISON:  08/10/2024 FINDINGS: The nasogastric tube tip and side hole remain in the stomach. Normal bowel-gas pattern. Oral contrast in the colon with a large number of colonic diverticula demonstrated. Mild lumbar spine degenerative changes and mild scoliosis. Hernia repair mesh anchors overlying the inferior pelvis on the left. Normal sized heart. Clear lungs. 0.2 cm oval calcification overlying the mid abdomen on the right on 2 of the views and overlying the right lung base on 1 of the views. 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 There is no corresponding calcific density on the CT. IMPRESSION: 1. No evidence of bowel obstruction. 2. Colonic diverticulosis. 3. Indeterminate 2 cm oval calcification on the right, as described above. Electronically Signed   By: Elspeth Bathe M.D.   On: 08/11/2024 12:24        Scheduled Meds:  dorzolamide -timolol   1 drop Both Eyes BID   enoxaparin  (LOVENOX ) injection  0.5 mg/kg Subcutaneous Q24H   losartan   100 mg Oral Daily   And   hydrochlorothiazide   25 mg Oral Daily   latanoprost   1 drop Left Eye QHS   pantoprazole  (PROTONIX ) IV  40 mg Intravenous Daily   sodium chloride  flush  10-40 mL Intracatheter Q12H   tamoxifen   20 mg Oral Daily   verapamil   240 mg Oral Daily   Continuous Infusions:  sodium chloride  75 mL/hr at 08/11/24 1736   promethazine (PHENERGAN) injection (IM or IVPB) Stopped (08/12/24 0003)     LOS: 6 days        Anthony CHRISTELLA Pouch, MD [facility] Pager [phone]  If 7PM-7AM, please contact night-coverage [email or web address] 08/12/2024, 8:15 AM

## 2024-08-12 NOTE — Plan of Care (Signed)

## 2024-08-12 NOTE — Progress Notes (Signed)
 [facility] SURGICAL PROGRESS NOTE (cpt [phone])  Hospital Day(s): 6.   Interval History: Patient seen and examined, no acute events or new complaints overnight. Patient reports she feels better from abdominal perspective. Has sensation of something stuck in her throat. No abdominal pain, nausea, emesis. She did have a bump in WBC to 11.2K. Hgb to 10.9; stable. Renal function stable; sCr - 1.04; UO - unmeasured. No electrolyte derangements. NGT removed 10-29 after passing clamp trial. She is on CLD: no issue. She is having bowel function   Review of Systems:  Constitutional: denies fever, chills  HEENT: denies cough or congestion  Respiratory: denies any shortness of breath  Cardiovascular: denies chest pain or palpitations  Gastrointestinal: denies abdominal pain, N/V Genitourinary: denies burning with urination or urinary frequency Musculoskeletal: denies pain, decreased motor or sensation  Vital signs in last 24 hours: [min-max] current  Temp:  [98.2 F (36.8 C)-98.9 F (37.2 C)] 98.2 F (36.8 C) (10/30 0252) Pulse Rate:  [58-75] 63 (10/30 0252) Resp:  [16-21] 21 (10/30 0252) BP: (136-160)/(66-80) 160/80 (10/30 0252) SpO2:  [96 %-100 %] 96 % (10/30 0252)     Height: 5' 3 (160 cm) Weight: 79.8 kg BMI (Calculated): 31.18   Intake/Output last 2 shifts:  10/29 0701 - 10/30 0700 In: -  Out: 100 [Emesis/NG output:100]   Physical Exam:  Constitutional: alert, cooperative and no distress, sitting at side of the bed  HEENT: NGT removed yesterday, no obvious findings in posterior oropharynx to explain feeling of something stuck  Eyes: PERRL, EOM's grossly intact and symmetric  Respiratory: breathing non-labored at rest  Cardiovascular: regular rate and sinus rhythm  Gastrointestinal: Soft, she does not appear tender this AM, she is no distended, no rebound.guarding. Certainly not peritonitic.    Labs:     Latest Ref Rng & Units 08/12/2024    4:51 AM 08/11/2024     4:06 AM 08/09/2024    5:05 AM  CBC  WBC 4.0 - 10.5 K/uL 11.2  9.9  7.3   Hemoglobin 12.0 - 15.0 g/dL 89.0  89.1  88.2   Hematocrit 36.0 - 46.0 % 32.2  33.2  35.4   Platelets 150 - 400 K/uL 312  351  373       Latest Ref Rng & Units 08/12/2024    4:51 AM 08/11/2024    4:06 AM 08/10/2024    5:24 AM  CMP  Glucose 70 - 99 mg/dL 95  82  897   BUN 8 - 23 mg/dL 24  26  18    Creatinine 0.44 - 1.00 mg/dL 8.95  8.91  8.97   Sodium 135 - 145 mmol/L 144  144  143   Potassium 3.5 - 5.1 mmol/L 3.7  4.0  4.0   Chloride 98 - 111 mmol/L 112  112  109   CO2 22 - 32 mmol/L 22  23  24    Calcium  8.9 - 10.3 mg/dL 8.4  8.6  8.4     Imaging studies:  No new imaging studies    Assessment/Plan: (ICD-10's: K69.609) 78 y.o. female with SBO   - Will advance to full liquid diet  - Will monitor this sensation of something stuck in her throat. If this persists, may need to consider ENT evaluation.  - No need for surgical intervention currently  - Monitor abdominal examination; on-going bowel function    - Pain control prn; antiemetics prn   - Mobilize as tolerated; therapies on board   - Further management  per primary service; we will follow   All of the above findings and recommendations were discussed with the patient, patient's family at bedside and on the phone, and the medical team, and all of patient's questions were answered to her expressed satisfaction.  -- Arthea Platt, PA-C [facility] 08/12/2024, 7:26 AM M-F: 7am - 4pm

## 2024-08-12 NOTE — Progress Notes (Signed)
 Mobility Specialist - Progress Note   08/12/24 1700  Mobility  Activity Stood at bedside;Dangled on edge of bed  Level of Assistance Standby assist, set-up cues, supervision of patient - no hands on  Assistive Device Front wheel walker  Distance Ambulated (ft) 4 ft  Range of Motion/Exercises All extremities  Activity Response Tolerated well  Mobility visit 1 Mobility  Mobility Specialist Start Time (ACUTE ONLY) 1555  Mobility Specialist Stop Time (ACUTE ONLY) 1608  Mobility Specialist Time Calculation (min) (ACUTE ONLY) 13 min   Pt was supine in bed with the HOB elevated and on RA upon entry. Pt agreed to mobility. Pt is able to get to the EOB independently with no AD. Pt is able to STS with 2 WW. Pt was able to perform activities at the EOB with 2 WW for safe guard. After activity pt returned to the bed with needs in reach.  Clem Rodes Mobility Specialist 08/12/24, 5:36 PM

## 2024-08-12 NOTE — Progress Notes (Signed)
 PHARMACIST - PHYSICIAN COMMUNICATION CONCERNING: IV to Oral Route Change Policy  RECOMMENDATION: This patient is receiving pantoprazole  intravenously. Based on criteria approved by the Pharmacy and Therapeutics Committee, the intravenous medication(s) is/are being converted to the equivalent dose of an oral formulation.   DESCRIPTION: These criteria include: The patient is eating (either orally or via tube) and/or has been taking other orally administered medications for at least 24 hours. The patient has no evidence of active gastrointestinal bleeding or malabsorptive GI state (gastrectomy; short bowel; patient on TNA or NPO).  If you have questions about this conversion, please contact the Pharmacy Department:  [x]   [phone] )  [facility] []   [phone] )  [facility] []   [phone] )  [facility] []   [phone] )  [facility]   Will M. Lenon, PharmD, BCPS Clinical Pharmacist 08/12/2024 11:27 AM

## 2024-08-12 NOTE — Progress Notes (Signed)
 Physical Therapy Treatment Patient Details Name: Katie Thompson MRN: [id number] DOB: 08-Dec-1945 Today's Date: 08/12/2024   History of Present Illness Katie Thompson is a 78 y.o. African-American female with medical history significant for type 2 diabetes mellitus, GERD, hypertension and dyslipidemia, who presented to the emergency room with acute onset of diarrhea over the last couple of days which has been nonbloody and profuse with no mucus or melena.  She has been having lower abdominal cramps and nausea without vomiting.  No dysuria, oliguria or hematuria or flank pain.  No chest pain or palpitations.  No cough or wheezing or dyspnea.  She has been feeling generally weak.    PT Comments  Patient seen for PT session focused on OOB mobility. Patient required CGA for hallway ambulation of 250 feet and used RW. Tolerated session well with no signs of exertion or distress. Vitals remained no stable during activity. Main limiting factors today were energy level secondary to NPO. Interventions aimed at improving functional mobility. Continued skilled PT recommended to progress toward functional goals and support discharge readiness.    If plan is discharge home, recommend the following: A little help with walking and/or transfers;A little help with bathing/dressing/bathroom   Can travel by private vehicle        Equipment Recommendations  Rolling walker (2 wheels);BSC/3in1    Recommendations for Other Services       Precautions / Restrictions Precautions Precautions: Fall Restrictions Weight Bearing Restrictions Per Provider Order: No     Mobility  Bed Mobility Overal bed mobility: Needs Assistance Bed Mobility: Sidelying to Sit, Rolling Rolling: Contact guard assist Sidelying to sit: Contact guard assist            Transfers Overall transfer level: Needs assistance Equipment used: Rolling walker (2 wheels) Transfers: Sit to/from Stand Sit to Stand: Contact guard assist, Min  assist                Ambulation/Gait Ambulation/Gait assistance: Contact guard assist, Min assist Gait Distance (Feet): 250 Feet Assistive device: Rolling walker (2 wheels) Gait Pattern/deviations: Step-through pattern, Trunk flexed, Shuffle, Decreased stride length, Narrow base of support       General Gait Details: mild unsteadiness; increase RR at the end of ambulation bout of 100 feet   Stairs             Wheelchair Mobility     Tilt Bed    Modified Rankin (Stroke Patients Only)       Balance Overall balance assessment: Needs assistance Sitting-balance support: Feet supported Sitting balance-Leahy Scale: Good     Standing balance support: During functional activity, Reliant on assistive device for balance Standing balance-Leahy Scale: Fair                              Hotel Manager: No apparent difficulties  Cognition Arousal: Alert Behavior During Therapy: WFL for tasks assessed/performed   PT - Cognitive impairments: No apparent impairments                         Following commands: Intact      Cueing Cueing Techniques: Verbal cues  Exercises Other Exercises Other Exercises: Pt educated on role of PT in acute and importance of exercise at home; educated on recommended    General Comments        Pertinent Vitals/Pain Pain Assessment Pain Assessment: No/denies pain    Home  Living                          Prior Function            PT Goals (current goals can now be found in the care plan section) Acute Rehab PT Goals Patient Stated Goal: pt wants to go back home PT Goal Formulation: With patient Time For Goal Achievement: 08/28/24 Potential to Achieve Goals: Good Progress towards PT goals: Progressing toward goals    Frequency    Min 2X/week      PT Plan      Co-evaluation              AM-PAC PT 6 Clicks Mobility   Outcome Measure  Help needed  turning from your back to your side while in a flat bed without using bedrails?: None Help needed moving from lying on your back to sitting on the side of a flat bed without using bedrails?: A Little Help needed moving to and from a bed to a chair (including a wheelchair)?: A Little Help needed standing up from a chair using your arms (e.g., wheelchair or bedside chair)?: A Little Help needed to walk in hospital room?: A Little Help needed climbing 3-5 steps with a railing? : A Lot 6 Click Score: 18    End of Session Equipment Utilized During Treatment: Gait belt Activity Tolerance: Patient tolerated treatment well Patient left: in bed;with bed alarm set;with family/visitor present   PT Visit Diagnosis: Unsteadiness on feet (R26.81);Difficulty in walking, not elsewhere classified (R26.2)     Time: 8677-8661 PT Time Calculation (min) (ACUTE ONLY): 16 min  Charges:    $Therapeutic Activity: 8-22 mins PT General Charges $$ ACUTE PT VISIT: 1 Visit                     Sherlean Lesches DPT, PT     Sherlean A Taksh Hjort 08/12/2024, 2:26 PM

## 2024-08-13 ENCOUNTER — Inpatient Hospital Stay

## 2024-08-13 ENCOUNTER — Encounter: Payer: Self-pay | Admitting: Family Medicine

## 2024-08-13 DIAGNOSIS — K6389 Other specified diseases of intestine: Secondary | ICD-10-CM | POA: Diagnosis not present

## 2024-08-13 DIAGNOSIS — R112 Nausea with vomiting, unspecified: Secondary | ICD-10-CM | POA: Diagnosis not present

## 2024-08-13 DIAGNOSIS — Z4682 Encounter for fitting and adjustment of non-vascular catheter: Secondary | ICD-10-CM | POA: Diagnosis not present

## 2024-08-13 DIAGNOSIS — J36 Peritonsillar abscess: Secondary | ICD-10-CM

## 2024-08-13 DIAGNOSIS — J384 Edema of larynx: Secondary | ICD-10-CM | POA: Diagnosis not present

## 2024-08-13 DIAGNOSIS — E042 Nontoxic multinodular goiter: Secondary | ICD-10-CM | POA: Diagnosis not present

## 2024-08-13 DIAGNOSIS — K56609 Unspecified intestinal obstruction, unspecified as to partial versus complete obstruction: Secondary | ICD-10-CM | POA: Diagnosis not present

## 2024-08-13 LAB — CBC
HCT: 28.4 % — ABNORMAL LOW (ref 36.0–46.0)
Hemoglobin: 9.8 g/dL — ABNORMAL LOW (ref 12.0–15.0)
MCH: 29.1 pg (ref 26.0–34.0)
MCHC: 34.5 g/dL (ref 30.0–36.0)
MCV: 84.3 fL (ref 80.0–100.0)
Platelets: 281 K/uL (ref 150–400)
RBC: 3.37 MIL/uL — ABNORMAL LOW (ref 3.87–5.11)
RDW: 15.3 % (ref 11.5–15.5)
WBC: 14.2 K/uL — ABNORMAL HIGH (ref 4.0–10.5)
nRBC: 0 % (ref 0.0–0.2)

## 2024-08-13 LAB — GLUCOSE, CAPILLARY
Glucose-Capillary: 111 mg/dL — ABNORMAL HIGH (ref 70–99)
Glucose-Capillary: 113 mg/dL — ABNORMAL HIGH (ref 70–99)
Glucose-Capillary: 113 mg/dL — ABNORMAL HIGH (ref 70–99)
Glucose-Capillary: 115 mg/dL — ABNORMAL HIGH (ref 70–99)

## 2024-08-13 LAB — BASIC METABOLIC PANEL WITH GFR
Anion gap: 7 (ref 5–15)
BUN: 14 mg/dL (ref 8–23)
CO2: 23 mmol/L (ref 22–32)
Calcium: 8 mg/dL — ABNORMAL LOW (ref 8.9–10.3)
Chloride: 110 mmol/L (ref 98–111)
Creatinine, Ser: 0.92 mg/dL (ref 0.44–1.00)
GFR, Estimated: >60 mL/min (ref >60)
Glucose, Bld: 100 mg/dL — ABNORMAL HIGH (ref 70–99)
Potassium: 2.9 mmol/L — ABNORMAL LOW (ref 3.5–5.1)
Sodium: 140 mmol/L (ref 135–145)

## 2024-08-13 LAB — MAGNESIUM: Magnesium: 1.8 mg/dL (ref 1.7–2.4)

## 2024-08-13 LAB — PHOSPHORUS: Phosphorus: 2.5 mg/dL (ref 2.5–4.6)

## 2024-08-13 MED ORDER — MAGNESIUM SULFATE 2 GM/50ML IV SOLN
2.0000 g | Freq: Once | INTRAVENOUS | Status: AC
  Administered 2024-08-13: 2 g via INTRAVENOUS
  Filled 2024-08-13: qty 50

## 2024-08-13 MED ORDER — PANTOPRAZOLE SODIUM 40 MG PO TBEC
40.0000 mg | DELAYED_RELEASE_TABLET | Freq: Two times a day (BID) | ORAL | Status: DC
  Administered 2024-08-13 – 2024-08-16 (×6): 40 mg via ORAL
  Filled 2024-08-13 (×6): qty 1

## 2024-08-13 MED ORDER — METHYLPREDNISOLONE SODIUM SUCC 40 MG IJ SOLR
40.0000 mg | Freq: Two times a day (BID) | INTRAMUSCULAR | Status: DC
  Administered 2024-08-13 – 2024-08-14 (×3): 40 mg via INTRAVENOUS
  Filled 2024-08-13 (×3): qty 1

## 2024-08-13 MED ORDER — SODIUM CHLORIDE 0.9 % IV SOLN
1.5000 g | Freq: Four times a day (QID) | INTRAVENOUS | Status: AC
  Administered 2024-08-13 – 2024-08-15 (×9): 1.5 g via INTRAVENOUS
  Filled 2024-08-13 (×3): qty 4
  Filled 2024-08-13: qty 1.5
  Filled 2024-08-13 (×6): qty 4

## 2024-08-13 MED ORDER — IOHEXOL 300 MG/ML  SOLN
80.0000 mL | Freq: Once | INTRAMUSCULAR | Status: AC | PRN
  Administered 2024-08-13: 75 mL via INTRAVENOUS

## 2024-08-13 MED ORDER — POTASSIUM CHLORIDE CRYS ER 20 MEQ PO TBCR
40.0000 meq | EXTENDED_RELEASE_TABLET | Freq: Two times a day (BID) | ORAL | Status: AC
  Administered 2024-08-13 (×2): 40 meq via ORAL
  Filled 2024-08-13 (×2): qty 2

## 2024-08-13 NOTE — Plan of Care (Signed)

## 2024-08-13 NOTE — Progress Notes (Addendum)
 PROGRESS NOTE   HPI was taken from Dr. Lawence: Katie Thompson is a 78 y.o. African-American female with medical history significant for type 2 diabetes mellitus, GERD, hypertension and dyslipidemia, who presented to the emergency room with acute onset of diarrhea over the last couple of days which has been nonbloody and profuse with no mucus or melena.  She has been having lower abdominal cramps and nausea without vomiting.  No dysuria, oliguria or hematuria or flank pain.  No chest pain or palpitations.  No cough or wheezing or dyspnea.  She has been feeling generally weak.  Her diarrhea has stopped today.   ED Course: When she came to the ER, vital signs were within normal.  Labs revealed hypokalemia of 2.5 and hyponatremia 134 with hypochloremia 96, creatinine 1.26 and calcium  8.8 with albumin 3.4.  CBC was unremarkable. EKG as reviewed by me :  EKG showed sinus rhythm with a rate of 70 with probable left atrial hypertrophy and inferior Q waves as well as prolonged QT interval with QTc of 584 MS. Imaging: Pelvic CT scan revealed: 1. Findings concerning for small bowel obstruction. Associated long segment of small bowel wall thickening, without pneumatosis or free air. Consider surgical consultation. 2. Right femoral hernia containing decompressed distal small bowel. 3. Central uterine mass measuring 3.6 cm, favoring an endometrial mass rather than uterine fibroid. Consider pelvic US  for further evaluation. 4. Additional ancillary findings, as above.   Contact was made with Dr. Desiderio who is aware about the patient.  The patient was given 1 L bolus of IV normal saline and 10 mill colons IV potassium chloride .  The patient will be admitted to a medical telemetry bed for further evaluation and management.   CAMIYAH FRIBERG  FMW:[id number] DOB: 09-18-1946 DOA: 08/06/2024 PCP: [name] [name], MD   Assessment & Plan:   Principal Problem:   SBO (small bowel obstruction) (HCC) Active Problems:    Hypokalemia   Dyslipidemia   Essential hypertension   Type 2 diabetes mellitus without complications (HCC)   GERD without esophagitis  Assessment and Plan: SBO: continue on fluid liquid as per pt's preference as per gen surg. Has had BMs. C/o intermittent nausea. Zofran  prn for nausea. Gen surg following and recs apprec   Early tonsillar abscess: as per CT. Started on IV unasyn and IV steroids. No ENT coverage noted on amion. Speech consulted as well as per pt's family request   Thyroid nodules: on left & right lobes & incidental finding on CT. Will need thyroid US  as an outpatient    Hypokalemia: potassium given  Hypomagnesemia: mg sulfate ordered    HLD: continue on home dose of statin    DM2: well controlled, HbA1c 6.3. No need for SSI currently   HTN: continue on home dose of hydrochlorothiazide , losartan , verapamil , losartan . IV hydralazine prn   Normocytic anemia: H&H are labile. Will transfuse if Hb < 7.0   Likely AKI: Cr is trending down from day prior. Will continue to monitor   DVT prophylaxis: lovenox  Code Status: full  Family Communication: discussed pt's care w/ pt's granddaughter, Raquel, and answered her questions  Disposition Plan: d/c back home w/ HH   Level of care: Telemetry Medical  Status is: Inpatient Remains inpatient appropriate because: c/o nausea    Consultants:  Gen surg  Procedures:   Antimicrobials:   Subjective: Pt c/o feeling of something stuck in her throat.   Objective: Vitals:   08/12/24 1504 08/12/24 1941 08/13/24 0423 08/13/24 9251  BP: (!) 148/74 (!) 157/70 127/66 (!) 143/82  Pulse: (!) 55 60 72 62  Resp: 16 18 18 17   Temp: 98.3 F (36.8 C) 98.1 F (36.7 C) 99.3 F (37.4 C) 98.6 F (37 C)  TempSrc: Oral Oral Oral Oral  SpO2: 97% 97% 95% 97%  Weight:      Height:        Intake/Output Summary (Last 24 hours) at 08/13/2024 0842 Last data filed at 08/12/2024 1700 Gross per 24 hour  Intake 700 ml  Output --  Net  700 ml   Filed Weights   08/06/24 2345  Weight: 79.8 kg    Examination:  General exam: appears calm but uncomfortable    Respiratory system: clear breath sounds b/l  Cardiovascular system: S1 & S2+. No rubs or clicks   Gastrointestinal system: abd is soft, NT, obese & normal bowel sounds Central nervous system: alert & oriented. Moves all extremities  Psychiatry: Judgement and insight appears at baseline. Flat mood and affect   Data Reviewed: I have personally reviewed following labs and imaging studies  CBC: Recent Labs  Lab 08/07/24 0724 08/09/24 0505 08/11/24 0406 08/12/24 0451 08/13/24 0407  WBC 6.4 7.3 9.9 11.2* 14.2*  HGB 10.1* 11.7* 10.8* 10.9* 9.8*  HCT 30.8* 35.4* 33.2* 32.2* 28.4*  MCV 85.8 86.6 88.8 85.9 84.3  PLT 285 373 351 312 281   Basic Metabolic Panel: Recent Labs  Lab 08/06/24 2010 08/06/24 2011 08/08/24 0518 08/09/24 0505 08/10/24 0524 08/11/24 0406 08/12/24 0451 08/13/24 0407  NA  --    < > 139 139 143 144 144 140  K  --    < > 3.2* 3.3* 4.0 4.0 3.7 2.9*  CL  --    < > 105 104 109 112* 112* 110  CO2  --    < > 25 23 24 23 22 23   GLUCOSE  --    < > 98 112* 102* 82 95 100*  BUN  --    < > 11 18 18  26* 24* 14  CREATININE  --    < > 0.97 1.25* 1.02* 1.08* 1.04* 0.92  CALCIUM   --    < > 8.0* 8.3* 8.4* 8.6* 8.4* 8.0*  MG 1.8   < > 2.3 2.6*  --  2.5* 2.2 1.8  PHOS 2.5  --   --   --   --   --  2.5 2.5   < > = values in this interval not displayed.   GFR: Estimated Creatinine Clearance: 48.8 mL/min (by C-G formula based on SCr of 0.92 mg/dL). Liver Function Tests: Recent Labs  Lab 08/06/24 2011  AST 21  ALT 15  ALKPHOS 53  BILITOT 0.8  PROT 7.4  ALBUMIN 3.4*   Recent Labs  Lab 08/06/24 2011  LIPASE 19   No results for input(s): AMMONIA in the last 168 hours. Coagulation Profile: No results for input(s): INR, PROTIME in the last 168 hours. Cardiac Enzymes: No results for input(s): CKTOTAL, CKMB, CKMBINDEX, TROPONINI  in the last 168 hours. BNP (last 3 results) No results for input(s): PROBNP in the last 8760 hours. HbA1C: No results for input(s): HGBA1C in the last 72 hours. CBG: Recent Labs  Lab 08/12/24 0517 08/12/24 1144 08/12/24 1805 08/13/24 0015 08/13/24 0533  GLUCAP 100* 101* 123* 113* 111*   Lipid Profile: No results for input(s): CHOL, HDL, LDLCALC, TRIG, CHOLHDL, LDLDIRECT in the last 72 hours. Thyroid Function Tests: No results for input(s): TSH, T4TOTAL, FREET4, T3FREE,  THYROIDAB in the last 72 hours. Anemia Panel: No results for input(s): VITAMINB12, FOLATE, FERRITIN, TIBC, IRON, RETICCTPCT in the last 72 hours. Sepsis Labs: Recent Labs  Lab 08/07/24 0122 08/07/24 0724  LATICACIDVEN 2.8* 1.3    Recent Results (from the past 240 hours)  Resp panel by RT-PCR (RSV, Flu A&B, Covid) Anterior Nasal Swab     Status: None   Collection Time: 08/07/24  2:38 AM   Specimen: Anterior Nasal Swab  Result Value Ref Range Status   SARS Coronavirus 2 by RT PCR NEGATIVE NEGATIVE Final    Comment: (NOTE) SARS-CoV-2 target nucleic acids are NOT DETECTED.  The SARS-CoV-2 RNA is generally detectable in upper respiratory specimens during the acute phase of infection. The lowest concentration of SARS-CoV-2 viral copies this assay can detect is 138 copies/mL. A negative result does not preclude SARS-Cov-2 infection and should not be used as the sole basis for treatment or other patient management decisions. A negative result may occur with  improper specimen collection/handling, submission of specimen other than nasopharyngeal swab, presence of viral mutation(s) within the areas targeted by this assay, and inadequate number of viral copies(<138 copies/mL). A negative result must be combined with clinical observations, patient history, and epidemiological information. The expected result is Negative.  Fact Sheet for Patients:   bloggercourse.com  Fact Sheet for Healthcare Providers:  seriousbroker.it  This test is no t yet approved or cleared by the [place]  FDA and  has been authorized for detection and/or diagnosis of SARS-CoV-2 by FDA under an Emergency Use Authorization (EUA). This EUA will remain  in effect (meaning this test can be used) for the duration of the COVID-19 declaration under Section 564(b)(1) of the Act, 21 U.S.C.section 360bbb-3(b)(1), unless the authorization is terminated  or revoked sooner.       Influenza A by PCR NEGATIVE NEGATIVE Final   Influenza B by PCR NEGATIVE NEGATIVE Final    Comment: (NOTE) The Xpert Xpress SARS-CoV-2/FLU/RSV plus assay is intended as an aid in the diagnosis of influenza from Nasopharyngeal swab specimens and should not be used as a sole basis for treatment. Nasal washings and aspirates are unacceptable for Xpert Xpress SARS-CoV-2/FLU/RSV testing.  Fact Sheet for Patients: bloggercourse.com  Fact Sheet for Healthcare Providers: seriousbroker.it  This test is not yet approved or cleared by the [place]  FDA and has been authorized for detection and/or diagnosis of SARS-CoV-2 by FDA under an Emergency Use Authorization (EUA). This EUA will remain in effect (meaning this test can be used) for the duration of the COVID-19 declaration under Section 564(b)(1) of the Act, 21 U.S.C. section 360bbb-3(b)(1), unless the authorization is terminated or revoked.     Resp Syncytial Virus by PCR NEGATIVE NEGATIVE Final    Comment: (NOTE) Fact Sheet for Patients: bloggercourse.com  Fact Sheet for Healthcare Providers: seriousbroker.it  This test is not yet approved or cleared by the [place]  FDA and has been authorized for detection and/or diagnosis of SARS-CoV-2 by FDA under an Emergency Use  Authorization (EUA). This EUA will remain in effect (meaning this test can be used) for the duration of the COVID-19 declaration under Section 564(b)(1) of the Act, 21 U.S.C. section 360bbb-3(b)(1), unless the authorization is terminated or revoked.  Performed at [facility], [address] [place], [place] [address]          Radiology Studies: No results found.       Scheduled Meds:  atorvastatin   10 mg Oral [id number]   dorzolamide -timolol   1  drop Both Eyes BID   enoxaparin  (LOVENOX ) injection  0.5 mg/kg Subcutaneous Q24H   losartan   100 mg Oral Daily   And   hydrochlorothiazide   25 mg Oral Daily   latanoprost   1 drop Left Eye QHS   pantoprazole   40 mg Oral Daily   potassium chloride   40 mEq Oral BID   scopolamine  1 patch Transdermal Q72H   sodium chloride  flush  10-40 mL Intracatheter Q12H   tamoxifen   20 mg Oral Daily   verapamil   240 mg Oral Daily   Continuous Infusions:  sodium chloride  75 mL/hr at 08/12/24 2338   promethazine (PHENERGAN) injection (IM or IVPB) Stopped (08/12/24 0003)     LOS: 7 days        Anthony CHRISTELLA Pouch, MD [facility] Pager [phone]  If 7PM-7AM, please contact night-coverage [email or web address] 08/13/2024, 8:42 AM

## 2024-08-13 NOTE — Progress Notes (Signed)
 Patient refused mobility throughout the shift Nurse encouraged patient to get up. Patient stated, "I'm not getting up ," and refused mobility. Patient educated on importance of ambulation. Family at bedside ATOR

## 2024-08-13 NOTE — Progress Notes (Signed)
 [facility] SURGICAL PROGRESS NOTE (cpt [phone])  Hospital Day(s): 7.   Interval History: Patient seen and examined, no acute events or new complaints overnight. Patient reports she is doing better. She still has sensation of something in her throat which she believes is source of nausea. No fever, chills, emesis. Interestingly, continues to have elevated leukocytosis; WBC 14.2K this AM. Hgb to 9.8. Renal function normal; sCr - 0.92; UO - unmeasured. Hypokalemia to 2.9. She did have KUB, contrast throughout the colon, bowel loop in RUQ without worsening. She has had BM and passing flatus. She is on FLD; wishes to stay there another 24 hours.   Review of Systems:  Constitutional: denies fever, chills  HEENT: denies cough or congestion  Respiratory: denies any shortness of breath  Cardiovascular: denies chest pain or palpitations  Gastrointestinal: denies abdominal pain, N/V Genitourinary: denies burning with urination or urinary frequency Musculoskeletal: denies pain, decreased motor or sensation  Vital signs in last 24 hours: [min-max] current  Temp:  [97.4 F (36.3 C)-99.3 F (37.4 C)] 99.3 F (37.4 C) (10/31 0423) Pulse Rate:  [55-72] 72 (10/31 0423) Resp:  [16-18] 18 (10/31 0423) BP: (127-175)/(66-80) 127/66 (10/31 0423) SpO2:  [95 %-98 %] 95 % (10/31 0423)     Height: 5' 3 (160 cm) Weight: 79.8 kg BMI (Calculated): 31.18   Intake/Output last 2 shifts:  10/30 0701 - 10/31 0700 In: 750 [I.V.:700; IV Piggyback:50] Out: -    Physical Exam:  Constitutional: alert, cooperative and no distress, sitting at side of the bed  HENT: NGT removed yesterday, no obvious findings in posterior oropharynx to explain feeling of something stuck  Eyes: PERRL, EOM's grossly intact and symmetric  Respiratory: breathing non-labored at rest  Cardiovascular: regular rate and sinus rhythm  Gastrointestinal: Soft, she does not appear tender this AM, she is no distended, no  rebound.guarding. Certainly not peritonitic.    Labs:     Latest Ref Rng & Units 08/13/2024    4:07 AM 08/12/2024    4:51 AM 08/11/2024    4:06 AM  CBC  WBC 4.0 - 10.5 K/uL 14.2  11.2  9.9   Hemoglobin 12.0 - 15.0 g/dL 9.8  89.0  89.1   Hematocrit 36.0 - 46.0 % 28.4  32.2  33.2   Platelets 150 - 400 K/uL 281  312  351       Latest Ref Rng & Units 08/13/2024    4:07 AM 08/12/2024    4:51 AM 08/11/2024    4:06 AM  CMP  Glucose 70 - 99 mg/dL 899  95  82   BUN 8 - 23 mg/dL 14  24  26    Creatinine 0.44 - 1.00 mg/dL 9.07  8.95  8.91   Sodium 135 - 145 mmol/L 140  144  144   Potassium 3.5 - 5.1 mmol/L 2.9  3.7  4.0   Chloride 98 - 111 mmol/L 110  112  112   CO2 22 - 32 mmol/L 23  22  23    Calcium  8.9 - 10.3 mg/dL 8.0  8.4  8.6     Imaging studies:   KUB (08/13/2024) personally reviewed with continues air and contrast throughout the colon, no free air, bowel loop in RUQ, unchanged, and radiologist report pending...   Assessment/Plan: (ICD-10's: K3.609) 78 y.o. female with SBO   - We discussed advancement of diet but patient wishes to stay on FLD one more day; Okay to advance once ready  - Consider ENT consult for  feeling something stuck in her throat; nothing obvious on examination  - No need for surgical intervention currently  - Monitor abdominal examination; on-going bowel function    - Pain control prn; antiemetics prn   - Mobilize as tolerated; therapies on board   - Further management per primary service; we will follow   All of the above findings and recommendations were discussed with the patient, patient's family at bedside and on the phone, and the medical team, and all of patient's questions were answered to her expressed satisfaction.  -- Arthea Platt, PA-C [facility] 08/13/2024, 7:30 AM M-F: 7am - 4pm

## 2024-08-14 DIAGNOSIS — K56609 Unspecified intestinal obstruction, unspecified as to partial versus complete obstruction: Secondary | ICD-10-CM | POA: Diagnosis not present

## 2024-08-14 LAB — BASIC METABOLIC PANEL WITH GFR
Anion gap: 8 (ref 5–15)
BUN: 11 mg/dL (ref 8–23)
CO2: 23 mmol/L (ref 22–32)
Calcium: 8.3 mg/dL — ABNORMAL LOW (ref 8.9–10.3)
Chloride: 106 mmol/L (ref 98–111)
Creatinine, Ser: 1.02 mg/dL — ABNORMAL HIGH (ref 0.44–1.00)
GFR, Estimated: 56 mL/min — ABNORMAL LOW (ref >60)
Glucose, Bld: 123 mg/dL — ABNORMAL HIGH (ref 70–99)
Potassium: 3.5 mmol/L (ref 3.5–5.1)
Sodium: 137 mmol/L (ref 135–145)

## 2024-08-14 LAB — CBC
HCT: 30.5 % — ABNORMAL LOW (ref 36.0–46.0)
Hemoglobin: 10.3 g/dL — ABNORMAL LOW (ref 12.0–15.0)
MCH: 28.2 pg (ref 26.0–34.0)
MCHC: 33.8 g/dL (ref 30.0–36.0)
MCV: 83.6 fL (ref 80.0–100.0)
Platelets: 291 K/uL (ref 150–400)
RBC: 3.65 MIL/uL — ABNORMAL LOW (ref 3.87–5.11)
RDW: 15.2 % (ref 11.5–15.5)
WBC: 13.8 K/uL — ABNORMAL HIGH (ref 4.0–10.5)
nRBC: 0 % (ref 0.0–0.2)

## 2024-08-14 LAB — GLUCOSE, CAPILLARY
Glucose-Capillary: 117 mg/dL — ABNORMAL HIGH (ref 70–99)
Glucose-Capillary: 147 mg/dL — ABNORMAL HIGH (ref 70–99)
Glucose-Capillary: 150 mg/dL — ABNORMAL HIGH (ref 70–99)
Glucose-Capillary: 150 mg/dL — ABNORMAL HIGH (ref 70–99)

## 2024-08-14 LAB — MAGNESIUM: Magnesium: 2.4 mg/dL (ref 1.7–2.4)

## 2024-08-14 LAB — PHOSPHORUS: Phosphorus: 2.5 mg/dL (ref 2.5–4.6)

## 2024-08-14 MED ORDER — POTASSIUM CHLORIDE CRYS ER 20 MEQ PO TBCR
40.0000 meq | EXTENDED_RELEASE_TABLET | Freq: Every day | ORAL | Status: AC
Start: 2024-08-14 — End: 2024-08-14
  Administered 2024-08-14: 40 meq via ORAL
  Filled 2024-08-14: qty 2

## 2024-08-14 NOTE — TOC Progression Note (Signed)
 Transition of Care [facility]) - Progression Note    Patient Details  Name: Katie Thompson MRN: [id number] Date of Birth: 26-Apr-1946  [facility] CM/SW Contact  Victory Jackquline RAMAN, RN Phone Number: 08/14/2024, 5:52 PM  Clinical Narrative:  RNCM received a secure chat fro the MD informing me that the plan is to discharge the patient tomorrow if medically stable. RNCM will continue to follow for discharge planning/care coordination and update as applicable.                       Expected Discharge Plan and Services                                               Social Drivers of Health (SDOH) Interventions SDOH Screenings   Food Insecurity: No Food Insecurity (08/07/2024)  Housing: Low Risk  (08/07/2024)  Transportation Needs: No Transportation Needs (08/07/2024)  Utilities: Not At Risk (08/07/2024)  Depression (PHQ2-9): Low Risk  (04/15/2024)  Financial Resource Strain: Low Risk  (04/26/2024)   Received from [facility]  Social Connections: Unknown (08/07/2024)  Tobacco Use: Low Risk  (08/06/2024)    Readmission Risk Interventions     No data to display

## 2024-08-14 NOTE — Evaluation (Signed)
 Clinical/Bedside Swallow Evaluation Patient Details  Name: Katie Thompson MRN: [id number] Date of Birth: Aug 11, 1946  Today's Date: 08/14/2024 Time: SLP Start Time (ACUTE ONLY): 0945 SLP Stop Time (ACUTE ONLY): 1030 SLP Time Calculation (min) (ACUTE ONLY): 45 min  Past Medical History:  Past Medical History:  Diagnosis Date   Arthritis    Breast cancer (HCC) 2014   LT with radiation   Diabetes mellitus without complication (HCC)    diet controlled no meds   Diverticulosis feb. 2015   from colonoscopy   GERD (gastroesophageal reflux disease)    History of hiatal hernia    Hyperlipidemia    Hypertension    Personal history of radiation therapy 2014   DCIS left breast   Past Surgical History:  Past Surgical History:  Procedure Laterality Date   APPENDECTOMY     BREAST BIOPSY Left 2016   coil marker, BENIGN BREAST TISSUE WITH CLUSTERED MICROCYSTS AND STROMAL FIBROSIS.    BREAST BIOPSY Left 01/05/2018   Affirm Bx- x marker, FIBROCYSTIC CHANGE WITH MICROCALCIFICATIONS   BREAST BIOPSY Left 02/04/2013   DCIS   BREAST BIOPSY Left 01/29/2021   Stereo Bx, X-Clip, INTRADUCTAL PAPILLOMA WITH SCLEROSING ADENOSIS    BREAST BIOPSY WITH RADIO FREQUENCY LOCALIZER Left 02/28/2021   Procedure: BREAST BIOPSY WITH RADIO FREQUENCY LOCALIZER;  Surgeon: Rodolph Romano, MD;  Location: [facility] ORS;  Service: General;  Laterality: Left;   BREAST EXCISIONAL BIOPSY Left 07/29/2017   SCLEROTIC INTRADUCTAL PAPILLOMA.   BREAST EXCISIONAL BIOPSY Left 02/28/2021   neg   BREAST LUMPECTOMY Left 2014   DCIS with clear margins.    BTL     EXCISION OF BREAST LESION Left 07/29/2017   Procedure: EXCISION OF BREAST MASS;  Surgeon: Claudene Larinda Bolder, MD;  Location: [facility] ORS;  Service: General;  Laterality: Left;   HERNIA REPAIR     NASAL SINUS SURGERY     HPI:  Per physician note, Katie Thompson is a 78 y.o. African-American female with medical history significant for type 2 diabetes mellitus, GERD,  hypertension and dyslipidemia, who presented to the emergency room with acute onset of diarrhea over the last couple of days which has been nonbloody and profuse with no mucus or melena.  She has been having lower abdominal cramps and nausea without vomiting.  No dysuria, oliguria or hematuria or flank pain. Soft Tissue CT: Swelling of the left palatine tonsil with a fluid pocket measuring  approximately 10 x 5 x 10 mm, compatible with phlegmon/early tonsillar abscess.  2. Solid-appearing 11 x 9 mm nodule in the superior pole of the left thyroid  lobe. In a patient 35 years, below the 1.5 cm threshold and without  suspicious features; no thyroid ultrasound follow-up is required per ACR  incidental thyroid nodule guidelines.  3. Hypodense, cystic appearing 13 x 8 x 9 mm nodule in the lower pole of the  left thyroid lobe. In a patient 33 years, below the 1.5 cm threshold and  without suspicious features; no thyroid ultrasound follow-up is required per  ACR incidental thyroid nodule guidelines.  4. Subcentimeter nodules in the right thyroid lobe. Below size thresholds; no  follow-up required per ACR guidelines.  5. A 6 mm round nodule posterior to the right thyroid lobe, possibly an  exophytic thyroid nodule or a parathyroid lesion. Below size thresholds and  without suspicious features; no specific follow-up required per ACR guidelines.  Clinical correlation if parathyroid lesion suspected.     Assessment / Plan / Recommendation  Clinical  Impression  Pt seen for bedside swallow assessment in the setting of globus sensation (pt reporting something is stuck in her throat), with soft tissue neck CT concerning for phlegmon/early tonsillar abscess. No history of dysphagia noted, though with baseline GERD. Oral motor exam unremarkable. Pt edentulous- dentures not present in the room. Pt seen with trials of thin liquids (via straw), puree, and regular solids. No overt or subtle s/sx pharyngeal dysphagia noted.  No change to vocal quality across trials. Oral phase mildly prolonged, related to lack of dentition and impact on mastication. Min increased time required for oral clearance.   Based on current debility, hx of GERD/recent nausea, and impact of edema related to tonsillar abscess, pt is at increased risk of aspiration, therefore recommend aspiration precautions (slow rate, small bites, elevated HOB, and alert for PO intake). Recommend mech soft and thin liquids. Education shared regarding rationale for diet recommendations and aspiration precaution. Pt/daughter reported understanding. SLP will monitor for safety with current diet or need for further education. MD and RN aware of recommendations.   SLP Visit Diagnosis: Dysphagia, unspecified (R13.10) (suspect related to tonsillar abscess, basline GERD)    Aspiration Risk  Moderate aspiration risk    Diet Recommendation   Dysphagia 3 (mechanical soft);Thin  Medication Administration: Crushed with puree    Other  Recommendations Recommended Consults: Consider ENT evaluation Oral Care Recommendations: Oral care BID;Patient independent with oral care     Assistance Recommended at Discharge  Pt independent with PO intake  Functional Status Assessment Patient has had a recent decline in their functional status and demonstrates the ability to make significant improvements in function in a reasonable and predictable amount of time.  Frequency and Duration min 2x/week  2 weeks       Prognosis Prognosis for improved oropharyngeal function: Good Barriers to Reach Goals:  (none)      Swallow Study   General Date of Onset: 08/14/24 HPI: Per physician note, Katie Thompson is a 78 y.o. African-American female with medical history significant for type 2 diabetes mellitus, GERD, hypertension and dyslipidemia, who presented to the emergency room with acute onset of diarrhea over the last couple of days which has been nonbloody and profuse with no mucus or  melena.  She has been having lower abdominal cramps and nausea without vomiting.  No dysuria, oliguria or hematuria or flank pain. Soft Tissue CT: Swelling of the left palatine tonsil with a fluid pocket measuring  approximately 10 x 5 x 10 mm, compatible with phlegmon/early tonsillar abscess.  2. Solid-appearing 11 x 9 mm nodule in the superior pole of the left thyroid  lobe. In a patient 33 years, below the 1.5 cm threshold and without  suspicious features; no thyroid ultrasound follow-up is required per ACR  incidental thyroid nodule guidelines.  3. Hypodense, cystic appearing 13 x 8 x 9 mm nodule in the lower pole of the  left thyroid lobe. In a patient 33 years, below the 1.5 cm threshold and  without suspicious features; no thyroid ultrasound follow-up is required per  ACR incidental thyroid nodule guidelines.  4. Subcentimeter nodules in the right thyroid lobe. Below size thresholds; no  follow-up required per ACR guidelines.  5. A 6 mm round nodule posterior to the right thyroid lobe, possibly an  exophytic thyroid nodule or a parathyroid lesion. Below size thresholds and  without suspicious features; no specific follow-up required per ACR guidelines.  Clinical correlation if parathyroid lesion suspected.  Type of Study: Bedside Swallow Evaluation  Previous Swallow Assessment: none in chart Diet Prior to this Study: Thin liquids (Level 0);Full liquid diet Temperature Spikes Noted: Yes Respiratory Status: Room air History of Recent Intubation: No Behavior/Cognition: Alert;Cooperative;Pleasant mood Oral Cavity Assessment: Within Functional Limits Oral Care Completed by SLP: Recent completion by staff Oral Cavity - Dentition: Edentulous (dentures not present in the room) Vision: Functional for self-feeding Self-Feeding Abilities: Able to feed self Patient Positioning: Upright in bed Baseline Vocal Quality: Normal Volitional Cough: Strong Volitional Swallow: Able to elicit     Oral/Motor/Sensory Function Overall Oral Motor/Sensory Function: Within functional limits   Ice Chips Ice chips: Not tested   Thin Liquid Thin Liquid: Within functional limits Presentation: Self Fed;Straw    Nectar Thick Nectar Thick Liquid: Not tested   Honey Thick Honey Thick Liquid: Not tested   Puree Puree: Within functional limits Presentation: Spoon;Self Fed   Solid     Solid: Within functional limits Presentation: Self Fed      Hadli Vandemark J Clapp 08/14/2024,11:59 AM

## 2024-08-14 NOTE — Progress Notes (Signed)
 Progress Note    Katie Thompson  FMW:[id number] DOB: 1946-04-15  DOA: 08/06/2024 PCP: [name] [name], MD      Brief Narrative:    Medical records reviewed and are as summarized below:  Katie Thompson is a 78 y.o. female with medical history significant for type II DM, hypertension, GERD, dyslipidemia, who presented to the hospital with acute onset of nonbloody diarrhea, nausea and lower abdominal pain/cramps.  Initial vitals in the ED were unremarkable.  CT abdomen and pelvis IMPRESSION: 1. Findings concerning for small bowel obstruction. Associated long segment of small bowel wall thickening, without pneumatosis or free air. Consider surgical consultation. 2. Right femoral hernia containing decompressed distal small bowel. 3. Central uterine mass measuring 3.6 cm, favoring an endometrial mass rather than uterine fibroid. Consider pelvic US  for further evaluation. 4. Additional ancillary findings, as above.        Assessment/Plan:   Principal Problem:   SBO (small bowel obstruction) (HCC) Active Problems:   Hypokalemia   Dyslipidemia   Essential hypertension   Type 2 diabetes mellitus without complications (HCC)   GERD without esophagitis    Body mass index is 31.18 kg/m.  (Class I obesity)   Small bowel obstruction: Improved.  Diet has been advanced from full liquid diet to soft diet.  General surgeon has signed off.   Early tonsillar abscess: Continue IV Unasyn.  Discontinue IV steroids. Follow-up with ENT as an outpatient   Bilateral thyroid nodules: These were incidental finding on CT soft tissue.  Outpatient follow-up with thyroid ultrasound recommended   Hypokalemia, hypomagnesemia: Improved.  Continue potassium repletion   AKI: Improving   Comorbidities include hyperlipidemia, type II DM (hemoglobin A1c 6.3), hypertension, normocytic anemia.   Diet Order             DIET DYS 3 Room service appropriate? Yes; Fluid consistency: Thin   Diet effective now                                  Consultants: General Surgeon  Procedures: None    Medications:    atorvastatin   10 mg Oral BH-q7a   dorzolamide -timolol   1 drop Both Eyes BID   enoxaparin  (LOVENOX ) injection  0.5 mg/kg Subcutaneous Q24H   losartan   100 mg Oral Daily   And   hydrochlorothiazide   25 mg Oral Daily   latanoprost   1 drop Left Eye QHS   methylPREDNISolone (SOLU-MEDROL) injection  40 mg Intravenous Q12H   pantoprazole   40 mg Oral BID   scopolamine  1 patch Transdermal Q72H   sodium chloride  flush  10-40 mL Intracatheter Q12H   tamoxifen   20 mg Oral Daily   verapamil   240 mg Oral Daily   Continuous Infusions:  ampicillin-sulbactam (UNASYN) IV 1.5 g (08/14/24 1511)   promethazine (PHENERGAN) injection (IM or IVPB) Stopped (08/12/24 0003)     Anti-infectives (From admission, onward)    Start     Dose/Rate Route Frequency Ordered Stop   08/13/24 1600  ampicillin-sulbactam (UNASYN) 1.5 g in sodium chloride  0.9 % 100 mL IVPB        1.5 g 200 mL/hr over 30 Minutes Intravenous Every 6 hours 08/13/24 1422                Family Communication/Anticipated D/C date and plan/Code Status   DVT prophylaxis: Place TED hose Start: 08/09/24 1335 enoxaparin  (LOVENOX ) injection 40 mg Start: 08/07/24 0800  Code Status: Full Code  Family Communication: Plan discussed with  Tabatha and Rexene, daughters, at the bedside Disposition Plan: Plan to discharge home   Status is: Inpatient Remains inpatient appropriate because: Small bowel obstruction, tonsillar abscess       Subjective:   Interval events noted.  She is moving her bowels.  No abdominal pain.  She has tolerated a liquid diet thus far.  She has some throat discomfort but otherwise feels okay.  Francis armin Rexene, daughters, at the bedside  Objective:    Vitals:   08/13/24 1945 08/14/24 0450 08/14/24 0751 08/14/24 1604  BP: 136/77 (!) 131/58 (!) 143/73  115/67  Pulse: 66 63 71 66  Resp: 18 18 16 17   Temp: 97.7 F (36.5 C) 97.9 F (36.6 C) 98.2 F (36.8 C) 98.7 F (37.1 C)  TempSrc: Oral Oral    SpO2: 96% 98% 95% 98%  Weight:      Height:       No data found.   Intake/Output Summary (Last 24 hours) at 08/14/2024 1620 Last data filed at 08/14/2024 1300 Gross per 24 hour  Intake 330 ml  Output --  Net 330 ml   Filed Weights   08/06/24 2345  Weight: 79.8 kg    Exam:  GEN: NAD SKIN: Warm and dry EYES: No pallor or icterus ENT: MMM CV: RRR PULM: CTA B ABD: soft, ND, NT, +BS CNS: AAO x 3, non focal EXT: No edema or tenderness        Data Reviewed:   I have personally reviewed following labs and imaging studies:  Labs: Labs show the following:   Basic Metabolic Panel: Recent Labs  Lab 08/09/24 0505 08/10/24 0524 08/11/24 0406 08/12/24 0451 08/13/24 0407 08/14/24 0559  NA 139 143 144 144 140 137  K 3.3* 4.0 4.0 3.7 2.9* 3.5  CL 104 109 112* 112* 110 106  CO2 23 24 23 22 23 23   GLUCOSE 112* 102* 82 95 100* 123*  BUN 18 18 26* 24* 14 11  CREATININE 1.25* 1.02* 1.08* 1.04* 0.92 1.02*  CALCIUM  8.3* 8.4* 8.6* 8.4* 8.0* 8.3*  MG 2.6*  --  2.5* 2.2 1.8 2.4  PHOS  --   --   --  2.5 2.5 2.5   GFR Estimated Creatinine Clearance: 44 mL/min (A) (by C-G formula based on SCr of 1.02 mg/dL (H)). Liver Function Tests: No results for input(s): AST, ALT, ALKPHOS, BILITOT, PROT, ALBUMIN in the last 168 hours. No results for input(s): LIPASE, AMYLASE in the last 168 hours. No results for input(s): AMMONIA in the last 168 hours. Coagulation profile No results for input(s): INR, PROTIME in the last 168 hours.  CBC: Recent Labs  Lab 08/09/24 0505 08/11/24 0406 08/12/24 0451 08/13/24 0407 08/14/24 0559  WBC 7.3 9.9 11.2* 14.2* 13.8*  HGB 11.7* 10.8* 10.9* 9.8* 10.3*  HCT 35.4* 33.2* 32.2* 28.4* 30.5*  MCV 86.6 88.8 85.9 84.3 83.6  PLT 373 351 312 281 291   Cardiac Enzymes: No results  for input(s): CKTOTAL, CKMB, CKMBINDEX, TROPONINI in the last 168 hours. BNP (last 3 results) No results for input(s): PROBNP in the last 8760 hours. CBG: Recent Labs  Lab 08/13/24 1136 08/13/24 1715 08/14/24 0033 08/14/24 0614 08/14/24 1125  GLUCAP 113* 115* 150* 117* 150*   D-Dimer: No results for input(s): DDIMER in the last 72 hours. Hgb A1c: No results for input(s): HGBA1C in the last 72 hours. Lipid Profile: No results for input(s): CHOL, HDL, LDLCALC, TRIG, CHOLHDL,  LDLDIRECT in the last 72 hours. Thyroid function studies: No results for input(s): TSH, T4TOTAL, T3FREE, THYROIDAB in the last 72 hours.  Invalid input(s): FREET3 Anemia work up: No results for input(s): VITAMINB12, FOLATE, FERRITIN, TIBC, IRON, RETICCTPCT in the last 72 hours. Sepsis Labs: Recent Labs  Lab 08/11/24 0406 08/12/24 0451 08/13/24 0407 08/14/24 0559  WBC 9.9 11.2* 14.2* 13.8*    Microbiology Recent Results (from the past 240 hours)  Resp panel by RT-PCR (RSV, Flu A&B, Covid) Anterior Nasal Swab     Status: None   Collection Time: 08/07/24  2:38 AM   Specimen: Anterior Nasal Swab  Result Value Ref Range Status   SARS Coronavirus 2 by RT PCR NEGATIVE NEGATIVE Final    Comment: (NOTE) SARS-CoV-2 target nucleic acids are NOT DETECTED.  The SARS-CoV-2 RNA is generally detectable in upper respiratory specimens during the acute phase of infection. The lowest concentration of SARS-CoV-2 viral copies this assay can detect is 138 copies/mL. A negative result does not preclude SARS-Cov-2 infection and should not be used as the sole basis for treatment or other patient management decisions. A negative result may occur with  improper specimen collection/handling, submission of specimen other than nasopharyngeal swab, presence of viral mutation(s) within the areas targeted by this assay, and inadequate number of viral copies(<138 copies/mL). A  negative result must be combined with clinical observations, patient history, and epidemiological information. The expected result is Negative.  Fact Sheet for Patients:  bloggercourse.com  Fact Sheet for Healthcare Providers:  seriousbroker.it  This test is no t yet approved or cleared by the [place]  FDA and  has been authorized for detection and/or diagnosis of SARS-CoV-2 by FDA under an Emergency Use Authorization (EUA). This EUA will remain  in effect (meaning this test can be used) for the duration of the COVID-19 declaration under Section 564(b)(1) of the Act, 21 U.S.C.section 360bbb-3(b)(1), unless the authorization is terminated  or revoked sooner.       Influenza A by PCR NEGATIVE NEGATIVE Final   Influenza B by PCR NEGATIVE NEGATIVE Final    Comment: (NOTE) The Xpert Xpress SARS-CoV-2/FLU/RSV plus assay is intended as an aid in the diagnosis of influenza from Nasopharyngeal swab specimens and should not be used as a sole basis for treatment. Nasal washings and aspirates are unacceptable for Xpert Xpress SARS-CoV-2/FLU/RSV testing.  Fact Sheet for Patients: bloggercourse.com  Fact Sheet for Healthcare Providers: seriousbroker.it  This test is not yet approved or cleared by the [place]  FDA and has been authorized for detection and/or diagnosis of SARS-CoV-2 by FDA under an Emergency Use Authorization (EUA). This EUA will remain in effect (meaning this test can be used) for the duration of the COVID-19 declaration under Section 564(b)(1) of the Act, 21 U.S.C. section 360bbb-3(b)(1), unless the authorization is terminated or revoked.     Resp Syncytial Virus by PCR NEGATIVE NEGATIVE Final    Comment: (NOTE) Fact Sheet for Patients: bloggercourse.com  Fact Sheet for Healthcare  Providers: seriousbroker.it  This test is not yet approved or cleared by the [place]  FDA and has been authorized for detection and/or diagnosis of SARS-CoV-2 by FDA under an Emergency Use Authorization (EUA). This EUA will remain in effect (meaning this test can be used) for the duration of the COVID-19 declaration under Section 564(b)(1) of the Act, 21 U.S.C. section 360bbb-3(b)(1), unless the authorization is terminated or revoked.  Performed at [facility], [address], [place], [place] [address]     Procedures and  diagnostic studies:  DG Abd 2 Views Result Date: 08/13/2024 EXAM: 2 VIEW XRAY OF THE ABDOMEN 08/13/2024 07:08:00 AM COMPARISON: Abdominal series dated 08/11/2024. CLINICAL HISTORY: Nausea and vomiting. FINDINGS: LINES, TUBES AND DEVICES: An enteric catheter has been removed from the stomach. BOWEL: Persistent gaseous distention of loops of small and large bowel compatible with ileus. SOFT TISSUES: A calcified aneurysm is again noted in the right mid abdomen. No opaque urinary calculi. BONES: No acute osseous abnormality. IMPRESSION: 1. Persistent gaseous distention of small and large bowel loops compatible with ileus. 2. Calcified aneurysm in the right mid abdomen. Electronically signed by: Evalene Coho MD 08/13/2024 12:55 PM EDT RP Workstation: [id number]   CT SOFT TISSUE NECK W CONTRAST Result Date: 08/13/2024 EXAM: CT NECK WITH CONTRAST 08/13/2024 12:11:02 PM TECHNIQUE: CT of the neck was performed with the administration of 75 mL of iohexol  (OMNIPAQUE ) 300 MG/ML solution. Multiplanar reformatted images are provided for review. Automated exposure control, iterative reconstruction, and/or weight based adjustment of the mA/kV was utilized to reduce the radiation dose to as low as reasonably achievable. COMPARISON: None available. CLINICAL HISTORY: Laryngeal edema; sensation something stuck in throat. FINDINGS: AERODIGESTIVE  TRACT: Swelling of the left palatine tonsil. There is a fluid pocket measuring approximately 10 x 5 x 10 mm, compatible with phlegmon/early tonsillar abscess. SALIVARY GLANDS: The parotid and submandibular glands are unremarkable. THYROID: There is a solid-appearing nodule present within the superior pole of the left lobe of the thyroid, measuring approximately 11 x 9 mm. There is also a hypodense, cystic appearing nodule within the lower pole, measuring approximately 13 x 8 x 9 mm. Subcentimeter nodules are also present within the right lobe. There is also a 6 mm round nodule posterior to the right lobe which could represent either an exophytic thyroid nodule or a parathyroid lesion. LYMPH NODES: No suspicious cervical lymphadenopathy. SOFT TISSUES: The soft tissues of the neck are otherwise unremarkable. BRAIN, ORBITS, SINUSES AND MASTOIDS: No acute abnormality. LUNGS AND MEDIASTINUM: There is very visual consolidation/atelectasis present anteriorly within the right upper lung. BONES: Mild diffuse degenerative disc disease throughout the cervical spine. Bilateral cervical facet arthrosis. IMPRESSION: 1. Swelling of the left palatine tonsil with a fluid pocket measuring approximately 10 x 5 x 10 mm, compatible with phlegmon/early tonsillar abscess. 2. Solid-appearing 11 x 9 mm nodule in the superior pole of the left thyroid lobe. In a patient 35 years, below the 1.5 cm threshold and without suspicious features; no thyroid ultrasound follow-up is required per ACR incidental thyroid nodule guidelines. 3. Hypodense, cystic appearing 13 x 8 x 9 mm nodule in the lower pole of the left thyroid lobe. In a patient 35 years, below the 1.5 cm threshold and without suspicious features; no thyroid ultrasound follow-up is required per ACR incidental thyroid nodule guidelines. 4. Subcentimeter nodules in the right thyroid lobe. Below size thresholds; no follow-up required per ACR guidelines. 5. A 6 mm round nodule posterior  to the right thyroid lobe, possibly an exophytic thyroid nodule or a parathyroid lesion. Below size thresholds and without suspicious features; no specific follow-up required per ACR guidelines. Clinical correlation if parathyroid lesion suspected. Electronically signed by: Evalene Coho MD 08/13/2024 12:52 PM EDT RP Workstation: [id number]               LOS: 8 days   Zenovia Justman  [facility]   Pager on [email or web address]. If 7PM-7AM, please contact night-coverage at [email or web address]     08/14/2024, 4:20 PM

## 2024-08-14 NOTE — Progress Notes (Signed)
 [facility] SURGICAL PROGRESS NOTE (cpt [phone])  Hospital Day(s): 8.   Interval History: Patient seen and examined, no acute events or new complaints overnight.  CT scan with early.  Tonsillar abscess.  She reports doing better today and says her throat feels much improved with the antibiotics.  She continues to have bowel function and is tolerating soft diet  Review of Systems:  Constitutional: denies fever, chills  HEENT: denies cough or congestion  Respiratory: denies any shortness of breath  Cardiovascular: denies chest pain or palpitations  Gastrointestinal: denies abdominal pain, N/V Genitourinary: denies burning with urination or urinary frequency Musculoskeletal: denies pain, decreased motor or sensation  Vital signs in last 24 hours: [min-max] current  Temp:  [97.7 F (36.5 C)-99.1 F (37.3 C)] 98.2 F (36.8 C) (11/01 0751) Pulse Rate:  [60-71] 71 (11/01 0751) Resp:  [16-18] 16 (11/01 0751) BP: (131-147)/(58-77) 143/73 (11/01 0751) SpO2:  [94 %-98 %] 95 % (11/01 0751)     Height: 5' 3 (160 cm) Weight: 79.8 kg BMI (Calculated): 31.18   Intake/Output last 2 shifts:  10/31 0701 - 11/01 0700 In: 790 [P.O.:790] Out: -    Physical Exam:  Constitutional: alert, cooperative and no distress, sitting at side of the bed  Eyes: PERRL, EOM's grossly intact and symmetric  Respiratory: breathing non-labored at rest  Cardiovascular: regular rate and sinus rhythm  Gastrointestinal: Soft, she does not appear tender this AM, she is no distended, no rebound.guarding. Certainly not peritonitic.    Labs:     Latest Ref Rng & Units 08/14/2024    5:59 AM 08/13/2024    4:07 AM 08/12/2024    4:51 AM  CBC  WBC 4.0 - 10.5 K/uL 13.8  14.2  11.2   Hemoglobin 12.0 - 15.0 g/dL 89.6  9.8  89.0   Hematocrit 36.0 - 46.0 % 30.5  28.4  32.2   Platelets 150 - 400 K/uL 291  281  312       Latest Ref Rng & Units 08/14/2024    5:59 AM 08/13/2024    4:07 AM 08/12/2024    4:51 AM   CMP  Glucose 70 - 99 mg/dL 876  899  95   BUN 8 - 23 mg/dL 11  14  24    Creatinine 0.44 - 1.00 mg/dL 8.97  9.07  8.95   Sodium 135 - 145 mmol/L 137  140  144   Potassium 3.5 - 5.1 mmol/L 3.5  2.9  3.7   Chloride 98 - 111 mmol/L 106  110  112   CO2 22 - 32 mmol/L 23  23  22    Calcium  8.9 - 10.3 mg/dL 8.3  8.0  8.4     Imaging studies:   CT scan with possible peritonsillar abscess   Assessment/Plan: (ICD-10's: K51.609) 78 y.o. female with SBO   - Patient has agreed to to have her diet advanced to soft dysphagia diet.  I told her that she could continue with full liquid diet if she wanted to but she can also order something soft.  - Consider ENT consult for peritonsillar abscess - No need for surgical intervention currently  - Monitor abdominal examination; on-going bowel function    - Pain control prn; antiemetics prn   - Mobilize as tolerated; therapies on board   - Further management per primary service; we will follow   General Surgery to sign off now please call with questions or concerns -- Jayson MALVA Endow,

## 2024-08-14 NOTE — Plan of Care (Signed)

## 2024-08-14 NOTE — Progress Notes (Signed)
 Mobility Specialist - Progress Note   08/14/24 1500  Mobility  Activity Ambulated with assistance;Stood at bedside;Dangled on edge of bed  Level of Assistance Contact guard assist, steadying assist  Assistive Device Front wheel walker  Distance Ambulated (ft) 200 ft  Range of Motion/Exercises All extremities  Activity Response Tolerated well  Mobility visit 1 Mobility  Mobility Specialist Start Time (ACUTE ONLY) 1319  Mobility Specialist Stop Time (ACUTE ONLY) 1333  Mobility Specialist Time Calculation (min) (ACUTE ONLY) 14 min   Pt was at the EOB on RA with guest in the room upon entry. Pt agreed to mobility. Pt is able to STS independently with 2 WW. Pt performed all activities well. Activities include leg extension, and ankle flexes. Pt ambulated well Pt did not need recovery break. All O2 vitals remained above 90% throughout activity. Pt form and strength increased as activities continued. After activities pt returned to the room back in bed with needs in reach and family in room.  Clem Rodes Mobility Specialist 08/14/24, 3:43 PM

## 2024-08-15 DIAGNOSIS — K56609 Unspecified intestinal obstruction, unspecified as to partial versus complete obstruction: Secondary | ICD-10-CM | POA: Diagnosis not present

## 2024-08-15 LAB — BASIC METABOLIC PANEL WITH GFR
Anion gap: 6 (ref 5–15)
BUN: 15 mg/dL (ref 8–23)
CO2: 24 mmol/L (ref 22–32)
Calcium: 8 mg/dL — ABNORMAL LOW (ref 8.9–10.3)
Chloride: 106 mmol/L (ref 98–111)
Creatinine, Ser: 1.37 mg/dL — ABNORMAL HIGH (ref 0.44–1.00)
GFR, Estimated: 39 mL/min — ABNORMAL LOW (ref >60)
Glucose, Bld: 116 mg/dL — ABNORMAL HIGH (ref 70–99)
Potassium: 3.7 mmol/L (ref 3.5–5.1)
Sodium: 136 mmol/L (ref 135–145)

## 2024-08-15 LAB — CBC
HCT: 27.6 % — ABNORMAL LOW (ref 36.0–46.0)
Hemoglobin: 9.3 g/dL — ABNORMAL LOW (ref 12.0–15.0)
MCH: 28.5 pg (ref 26.0–34.0)
MCHC: 33.7 g/dL (ref 30.0–36.0)
MCV: 84.7 fL (ref 80.0–100.0)
Platelets: 271 K/uL (ref 150–400)
RBC: 3.26 MIL/uL — ABNORMAL LOW (ref 3.87–5.11)
RDW: 15.6 % — ABNORMAL HIGH (ref 11.5–15.5)
WBC: 18.3 K/uL — ABNORMAL HIGH (ref 4.0–10.5)
nRBC: 0.1 % (ref 0.0–0.2)

## 2024-08-15 LAB — GLUCOSE, CAPILLARY
Glucose-Capillary: 112 mg/dL — ABNORMAL HIGH (ref 70–99)
Glucose-Capillary: 116 mg/dL — ABNORMAL HIGH (ref 70–99)
Glucose-Capillary: 126 mg/dL — ABNORMAL HIGH (ref 70–99)
Glucose-Capillary: 132 mg/dL — ABNORMAL HIGH (ref 70–99)
Glucose-Capillary: 144 mg/dL — ABNORMAL HIGH (ref 70–99)
Glucose-Capillary: 145 mg/dL — ABNORMAL HIGH (ref 70–99)

## 2024-08-15 LAB — PHOSPHORUS: Phosphorus: 2.4 mg/dL — ABNORMAL LOW (ref 2.5–4.6)

## 2024-08-15 LAB — ALBUMIN: Albumin: 2.4 g/dL — ABNORMAL LOW (ref 3.5–5.0)

## 2024-08-15 LAB — MAGNESIUM: Magnesium: 2.3 mg/dL (ref 1.7–2.4)

## 2024-08-15 MED ORDER — LOSARTAN POTASSIUM 50 MG PO TABS
100.0000 mg | ORAL_TABLET | Freq: Every day | ORAL | Status: DC
  Administered 2024-08-15 – 2024-08-16 (×2): 100 mg via ORAL
  Filled 2024-08-15 (×2): qty 2

## 2024-08-15 MED ORDER — LACTATED RINGERS IV SOLN: INTRAVENOUS | Status: AC

## 2024-08-15 MED ORDER — HYDROCHLOROTHIAZIDE 25 MG PO TABS
25.0000 mg | ORAL_TABLET | Freq: Every day | ORAL | Status: DC
  Administered 2024-08-16: 25 mg via ORAL
  Filled 2024-08-15: qty 1

## 2024-08-15 MED ORDER — AMOXICILLIN-POT CLAVULANATE 875-125 MG PO TABS
1.0000 | ORAL_TABLET | Freq: Two times a day (BID) | ORAL | Status: DC
  Administered 2024-08-15 – 2024-08-16 (×2): 1 via ORAL
  Filled 2024-08-15 (×2): qty 1

## 2024-08-15 NOTE — Progress Notes (Signed)
 Progress Note    Katie Thompson  FMW:[id number] DOB: Jul 04, 1946  DOA: 08/06/2024 PCP: [name] [name], MD      Brief Narrative:    Medical records reviewed and are as summarized below:  Katie Thompson is a 78 y.o. female with medical history significant for type II DM, hypertension, GERD, dyslipidemia, who presented to the hospital with acute onset of nonbloody diarrhea, nausea and lower abdominal pain/cramps.  Initial vitals in the ED were unremarkable.  CT abdomen and pelvis IMPRESSION: 1. Findings concerning for small bowel obstruction. Associated long segment of small bowel wall thickening, without pneumatosis or free air. Consider surgical consultation. 2. Right femoral hernia containing decompressed distal small bowel. 3. Central uterine mass measuring 3.6 cm, favoring an endometrial mass rather than uterine fibroid. Consider pelvic US  for further evaluation. 4. Additional ancillary findings, as above.        Assessment/Plan:   Principal Problem:   SBO (small bowel obstruction) (HCC) Active Problems:   Hypokalemia   Dyslipidemia   Essential hypertension   Type 2 diabetes mellitus without complications (HCC)   GERD without esophagitis    Body mass index is 31.18 kg/m.  (Class I obesity)   Small bowel obstruction: She complains of intermittent abdominal pain.  Continue soft diet as tolerated. General surgeon has signed off.   Early tonsillar abscess: IV Unasyn to Augmentin . S/p treatment with IV Solu-Medrol. Follow-up with ENT as an outpatient   Worsening leukocytosis: May be related to recent steroids. Change IV Unasyn to Augmentin .  Monitor WBC.   Bilateral thyroid nodules: These were incidental finding on CT soft tissue.  Outpatient follow-up with thyroid ultrasound recommended   Hypokalemia, hypomagnesemia: Improved.  Continue potassium repletion   AKI: Worsening AKI.  Creatinine up from 1.02-1.37.  Start "Ringer's"  lactate infusion  and repeat BMP tomorrow   Comorbidities include hyperlipidemia, type II DM (hemoglobin A1c 6.3), hypertension, normocytic anemia.   Discussed disposition plans with the patient.  Discussed discharging home today with close outpatient follow-up.  However, patient said she does not feel well enough to go home today.  Rexene, daughter, at the bedside agrees with her decision.  Possible discharge to home tomorrow.   Diet Order             DIET DYS 3 Room service appropriate? Yes; Fluid consistency: Thin  Diet effective now                                  Consultants: General Surgeon  Procedures: None    Medications:    amoxicillin -clavulanate  1 tablet Oral Q12H   atorvastatin   10 mg Oral BH-q7a   dorzolamide -timolol   1 drop Both Eyes BID   enoxaparin  (LOVENOX ) injection  0.5 mg/kg Subcutaneous Q24H   losartan   100 mg Oral Daily   And   [START ON 08/16/2024] hydrochlorothiazide   25 mg Oral Daily   latanoprost   1 drop Left Eye QHS   pantoprazole   40 mg Oral BID   scopolamine  1 patch Transdermal Q72H   sodium chloride  flush  10-40 mL Intracatheter Q12H   tamoxifen   20 mg Oral Daily   verapamil   240 mg Oral Daily   Continuous Infusions:  ampicillin-sulbactam (UNASYN) IV 1.5 g (08/15/24 1109)   promethazine (PHENERGAN) injection (IM or IVPB) Stopped (08/12/24 0003)     Anti-infectives (From admission, onward)    Start     Dose/Rate  Route Frequency Ordered Stop   08/15/24 2200  amoxicillin -clavulanate (AUGMENTIN ) 875-125 MG per tablet 1 tablet        1 tablet Oral Every 12 hours 08/15/24 1057     08/13/24 1600  ampicillin-sulbactam (UNASYN) 1.5 g in sodium chloride  0.9 % 100 mL IVPB        1.5 g 200 mL/hr over 30 Minutes Intravenous Every 6 hours 08/13/24 1422 08/15/24 2159              Family Communication/Anticipated D/C date and plan/Code Status   DVT prophylaxis: Place TED hose Start: 08/09/24 1335 enoxaparin  (LOVENOX ) injection 40 mg  Start: 08/07/24 0800     Code Status: Full Code  Family Communication: Plan discussed with Rexene, daughter and Vendrel grandson,, at the bedside Disposition Plan: Plan to discharge home   Status is: Inpatient Remains inpatient appropriate because: Small bowel obstruction, tonsillar abscess       Subjective:   Interval events noted.  She complains of abdominal pain and belching.  She said she normally has more abdominal pain after IV antibiotic infusion.  No bowel movement since yesterday morning.  Rexene, daughter and grandson were at the bedside.  Objective:    Vitals:   08/14/24 1604 08/14/24 1956 08/15/24 0310 08/15/24 0836  BP: 115/67 119/76 139/77 (!) 160/68  Pulse: 66 61 (!) 57 (!) 58  Resp: 17 18 18 17   Temp: 98.7 F (37.1 C) 97.9 F (36.6 C) 98 F (36.7 C) 97.9 F (36.6 C)  TempSrc:   Oral   SpO2: 98% 99% 98% 97%  Weight:      Height:       No data found.   Intake/Output Summary (Last 24 hours) at 08/15/2024 1233 Last data filed at 08/14/2024 1600 Gross per 24 hour  Intake 824.7 ml  Output --  Net 824.7 ml   Filed Weights   08/06/24 2345  Weight: 79.8 kg    Exam:  GEN: NAD SKIN: Warm and dry EYES: No pallor or  icterus ENT: MMM CV: RRR PULM: CTA B ABD: soft, ND, NT, +BS CNS: AAO x 3, non focal EXT: Mild bilateral leg edema.       Data Reviewed:   I have personally reviewed following labs and imaging studies:  Labs: Labs show the following:   Basic Metabolic Panel: Recent Labs  Lab 08/11/24 0406 08/12/24 0451 08/13/24 0407 08/14/24 0559 08/15/24 0501  NA 144 144 140 137 136  K 4.0 3.7 2.9* 3.5 3.7  CL 112* 112* 110 106 106  CO2 23 22 23 23 24   GLUCOSE 82 95 100* 123* 116*  BUN 26* 24* 14 11 15   CREATININE 1.08* 1.04* 0.92 1.02* 1.37*  CALCIUM  8.6* 8.4* 8.0* 8.3* 8.0*  MG 2.5* 2.2 1.8 2.4 2.3  PHOS  --  2.5 2.5 2.5 2.4*   GFR Estimated Creatinine Clearance: 32.8 mL/min (A) (by C-G formula based on SCr of 1.37 mg/dL  (H)). Liver Function Tests: No results for input(s): AST, ALT, ALKPHOS, BILITOT, PROT, ALBUMIN in the last 168 hours. No results for input(s): LIPASE, AMYLASE in the last 168 hours. No results for input(s): AMMONIA in the last 168 hours. Coagulation profile No results for input(s): INR, PROTIME in the last 168 hours.  CBC: Recent Labs  Lab 08/11/24 0406 08/12/24 0451 08/13/24 0407 08/14/24 0559 08/15/24 0501  WBC 9.9 11.2* 14.2* 13.8* 18.3*  HGB 10.8* 10.9* 9.8* 10.3* 9.3*  HCT 33.2* 32.2* 28.4* 30.5* 27.6*  MCV 88.8 85.9 84.3  83.6 84.7  PLT 351 312 281 291 271   Cardiac Enzymes: No results for input(s): CKTOTAL, CKMB, CKMBINDEX, TROPONINI in the last 168 hours. BNP (last 3 results) No results for input(s): PROBNP in the last 8760 hours. CBG: Recent Labs  Lab 08/14/24 1732 08/15/24 0028 08/15/24 0314 08/15/24 0639 08/15/24 1158  GLUCAP 147* 145* 132* 112* 116*   D-Dimer: No results for input(s): DDIMER in the last 72 hours. Hgb A1c: No results for input(s): HGBA1C in the last 72 hours. Lipid Profile: No results for input(s): CHOL, HDL, LDLCALC, TRIG, CHOLHDL, LDLDIRECT in the last 72 hours. Thyroid function studies: No results for input(s): TSH, T4TOTAL, T3FREE, THYROIDAB in the last 72 hours.  Invalid input(s): FREET3 Anemia work up: No results for input(s): VITAMINB12, FOLATE, FERRITIN, TIBC, IRON, RETICCTPCT in the last 72 hours. Sepsis Labs: Recent Labs  Lab 08/12/24 0451 08/13/24 0407 08/14/24 0559 08/15/24 0501  WBC 11.2* 14.2* 13.8* 18.3*    Microbiology Recent Results (from the past 240 hours)  Resp panel by RT-PCR (RSV, Flu A&B, Covid) Anterior Nasal Swab     Status: None   Collection Time: 08/07/24  2:38 AM   Specimen: Anterior Nasal Swab  Result Value Ref Range Status   SARS Coronavirus 2 by RT PCR NEGATIVE NEGATIVE Final    Comment: (NOTE) SARS-CoV-2 target nucleic acids  are NOT DETECTED.  The SARS-CoV-2 RNA is generally detectable in upper respiratory specimens during the acute phase of infection. The lowest concentration of SARS-CoV-2 viral copies this assay can detect is 138 copies/mL. A negative result does not preclude SARS-Cov-2 infection and should not be used as the sole basis for treatment or other patient management decisions. A negative result may occur with  improper specimen collection/handling, submission of specimen other than nasopharyngeal swab, presence of viral mutation(s) within the areas targeted by this assay, and inadequate number of viral copies(<138 copies/mL). A negative result must be combined with clinical observations, patient history, and epidemiological information. The expected result is Negative.  Fact Sheet for Patients:  bloggercourse.com  Fact Sheet for Healthcare Providers:  seriousbroker.it  This test is no t yet approved or cleared by the [place]  FDA and  has been authorized for detection and/or diagnosis of SARS-CoV-2 by FDA under an Emergency Use Authorization (EUA). This EUA will remain  in effect (meaning this test can be used) for the duration of the COVID-19 declaration under Section 564(b)(1) of the Act, 21 U.S.C.section 360bbb-3(b)(1), unless the authorization is terminated  or revoked sooner.       Influenza A by PCR NEGATIVE NEGATIVE Final   Influenza B by PCR NEGATIVE NEGATIVE Final    Comment: (NOTE) The Xpert Xpress SARS-CoV-2/FLU/RSV plus assay is intended as an aid in the diagnosis of influenza from Nasopharyngeal swab specimens and should not be used as a sole basis for treatment. Nasal washings and aspirates are unacceptable for Xpert Xpress SARS-CoV-2/FLU/RSV testing.  Fact Sheet for Patients: bloggercourse.com  Fact Sheet for Healthcare Providers: seriousbroker.it  This test is  not yet approved or cleared by the [place]  FDA and has been authorized for detection and/or diagnosis of SARS-CoV-2 by FDA under an Emergency Use Authorization (EUA). This EUA will remain in effect (meaning this test can be used) for the duration of the COVID-19 declaration under Section 564(b)(1) of the Act, 21 U.S.C. section 360bbb-3(b)(1), unless the authorization is terminated or revoked.     Resp Syncytial Virus by PCR NEGATIVE NEGATIVE Final    Comment: (NOTE) Fact  Sheet for Patients: bloggercourse.com  Fact Sheet for Healthcare Providers: seriousbroker.it  This test is not yet approved or cleared by the [place]  FDA and has been authorized for detection and/or diagnosis of SARS-CoV-2 by FDA under an Emergency Use Authorization (EUA). This EUA will remain in effect (meaning this test can be used) for the duration of the COVID-19 declaration under Section 564(b)(1) of the Act, 21 U.S.C. section 360bbb-3(b)(1), unless the authorization is terminated or revoked.  Performed at [facility], [address] [place], [place] [address]     Procedures and diagnostic studies:  No results found.              LOS: 9 days   Daris Aristizabal  [employer] Adjuster on [email or web address]. If 7PM-7AM, please contact night-coverage at [email or web address]     08/15/2024, 12:33 PM

## 2024-08-15 NOTE — Progress Notes (Signed)
 Physical Therapy Treatment Patient Details Name: Katie Thompson MRN: [id number] DOB: [birth year] Today's Date: 08/15/2024   History of Present Illness Katie Thompson is a 78 y.o. African-American female with medical history significant for type 2 diabetes mellitus, GERD, hypertension and dyslipidemia, who presented to the emergency room with acute onset of diarrhea over the last couple of days which has been nonbloody and profuse with no mucus or melena.  She has been having lower abdominal cramps and nausea without vomiting.  No dysuria, oliguria or hematuria or flank pain.  No chest pain or palpitations.  No cough or wheezing or dyspnea.  She has been feeling generally weak.    PT Comments  In chair.  C/o stomach discomfort.  Initially declined due to discomfort but pt burping frequently during discussion.  She is encouraged to walk to move gas and agrees.  Completes x 1 lap with no AD and does report feeling better.  She stated she does feel more confident with walker.  Will benefit from RW at home for outside and community ambulation.     If plan is discharge home, recommend the following: A little help with walking and/or transfers;A little help with bathing/dressing/bathroom;Help with stairs or ramp for entrance   Can travel by private vehicle        Equipment Recommendations  Rolling walker (2 wheels);BSC/3in1    Recommendations for Other Services       Precautions / Restrictions Precautions Precautions: Fall Restrictions Weight Bearing Restrictions Per Provider Order: No     Mobility  Bed Mobility               General bed mobility comments: in recliner before and anfter.  anticipate she would do well given gait Patient Response: Cooperative  Transfers Overall transfer level: Modified independent Equipment used: None Transfers: Sit to/from Stand Sit to Stand: Modified independent (Device/Increase time)                Ambulation/Gait Ambulation/Gait  assistance: Supervision, Contact guard assist Gait Distance (Feet): 200 Feet Assistive device: None Gait Pattern/deviations: Step-through pattern, Trunk flexed, Shuffle, Decreased stride length, Narrow base of support Gait velocity: dec     General Gait Details: mildly unsteady but no outside interventions.  feels more comfortable with RW   Stairs             Wheelchair Mobility     Tilt Bed Tilt Bed Patient Response: Cooperative  Modified Rankin (Stroke Patients Only)       Balance Overall balance assessment: Mild deficits observed, not formally tested Sitting-balance support: Feet supported Sitting balance-Leahy Scale: Normal     Standing balance support: No upper extremity supported Standing balance-Leahy Scale: Fair                              Hotel Manager: No apparent difficulties  Cognition Arousal: Alert Behavior During Therapy: WFL for tasks assessed/performed   PT - Cognitive impairments: No apparent impairments                         Following commands: Intact      Cueing    Exercises      General Comments        Pertinent Vitals/Pain Pain Assessment Pain Assessment: Faces Faces Pain Scale: Hurts little more Pain Location: stomach - burping a lot sitting in chair.  RN notified Pain Descriptors / Indicators: Sore  Pain Intervention(s): Limited activity within patient's tolerance, Monitored during session, Repositioned    Home Living                          Prior Function            PT Goals (current goals can now be found in the care plan section) Progress towards PT goals: Progressing toward goals    Frequency           PT Plan      Co-evaluation              AM-PAC PT 6 Clicks Mobility   Outcome Measure  Help needed turning from your back to your side while in a flat bed without using bedrails?: None Help needed moving from lying on your back  to sitting on the side of a flat bed without using bedrails?: None Help needed moving to and from a bed to a chair (including a wheelchair)?: None Help needed standing up from a chair using your arms (e.g., wheelchair or bedside chair)?: None Help needed to walk in hospital room?: A Little Help needed climbing 3-5 steps with a railing? : A Little 6 Click Score: 22    End of Session Equipment Utilized During Treatment: Gait belt Activity Tolerance: Patient tolerated treatment well Patient left: in chair;with call bell/phone within reach;with family/visitor present Nurse Communication: Other (comment) PT Visit Diagnosis: Unsteadiness on feet (R26.81);Difficulty in walking, not elsewhere classified (R26.2)     Time: 0922-0930 PT Time Calculation (min) (ACUTE ONLY): 8 min  Charges:    $Gait Training: 8-22 mins PT General Charges $$ ACUTE PT VISIT: 1 Visit                   Lauraine Gills, PTA 08/15/24, 9:36 AM

## 2024-08-15 NOTE — Plan of Care (Signed)
  Problem: Education: Goal: Ability to describe self-care measures that may prevent or decrease complications (Diabetes Survival Skills Education) will improve Outcome: Progressing   Problem: Coping: Goal: Ability to adjust to condition or change in health will improve Outcome: Progressing

## 2024-08-15 NOTE — Plan of Care (Signed)

## 2024-08-16 DIAGNOSIS — K56609 Unspecified intestinal obstruction, unspecified as to partial versus complete obstruction: Secondary | ICD-10-CM | POA: Diagnosis not present

## 2024-08-16 LAB — GLUCOSE, CAPILLARY
Glucose-Capillary: 85 mg/dL (ref 70–99)
Glucose-Capillary: 96 mg/dL (ref 70–99)

## 2024-08-16 LAB — RENAL FUNCTION PANEL
Albumin: 2.5 g/dL — ABNORMAL LOW (ref 3.5–5.0)
Anion gap: 9 (ref 5–15)
BUN: 16 mg/dL (ref 8–23)
CO2: 24 mmol/L (ref 22–32)
Calcium: 8 mg/dL — ABNORMAL LOW (ref 8.9–10.3)
Chloride: 103 mmol/L (ref 98–111)
Creatinine, Ser: 1.03 mg/dL — ABNORMAL HIGH (ref 0.44–1.00)
GFR, Estimated: 55 mL/min — ABNORMAL LOW (ref >60)
Glucose, Bld: 93 mg/dL (ref 70–99)
Phosphorus: 2.3 mg/dL — ABNORMAL LOW (ref 2.5–4.6)
Potassium: 3.4 mmol/L — ABNORMAL LOW (ref 3.5–5.1)
Sodium: 136 mmol/L (ref 135–145)

## 2024-08-16 LAB — CBC
HCT: 26.6 % — ABNORMAL LOW (ref 36.0–46.0)
Hemoglobin: 9 g/dL — ABNORMAL LOW (ref 12.0–15.0)
MCH: 28.6 pg (ref 26.0–34.0)
MCHC: 33.8 g/dL (ref 30.0–36.0)
MCV: 84.4 fL (ref 80.0–100.0)
Platelets: 263 K/uL (ref 150–400)
RBC: 3.15 MIL/uL — ABNORMAL LOW (ref 3.87–5.11)
RDW: 15.7 % — ABNORMAL HIGH (ref 11.5–15.5)
WBC: 12.1 K/uL — ABNORMAL HIGH (ref 4.0–10.5)
nRBC: 0 % (ref 0.0–0.2)

## 2024-08-16 LAB — MAGNESIUM: Magnesium: 2.1 mg/dL (ref 1.7–2.4)

## 2024-08-16 MED ORDER — AMOXICILLIN-POT CLAVULANATE 875-125 MG PO TABS: 1.0000 | ORAL_TABLET | Freq: Two times a day (BID) | ORAL | 0 refills | Status: AC

## 2024-08-16 MED ORDER — POTASSIUM CHLORIDE CRYS ER 10 MEQ PO TBCR
10.0000 meq | EXTENDED_RELEASE_TABLET | Freq: Once | ORAL | Status: AC
  Administered 2024-08-16: 10 meq via ORAL
  Filled 2024-08-16: qty 1

## 2024-08-16 NOTE — Progress Notes (Signed)
 Physical Therapy Treatment Patient Details Name: Katie Thompson MRN: [id number] DOB: 09/27/1946 Today's Date: 08/16/2024   History of Present Illness Katie Thompson is a 78 y.o. African-American female with medical history significant for type 2 diabetes mellitus, GERD, hypertension and dyslipidemia, who presented to the emergency room with acute onset of diarrhea over the last couple of days which has been nonbloody and profuse with no mucus or melena.  She has been having lower abdominal cramps and nausea without vomiting.  No dysuria, oliguria or hematuria or flank pain.  No chest pain or palpitations.  No cough or wheezing or dyspnea.  She has been feeling generally weak.    PT Comments  Pt up and completes x 1 lap with no AD.  One LOB recovered on her own.  Encouraged to use RW at home as needed especially in community and outside.  Voiced understanding.     If plan is discharge home, recommend the following: A little help with walking and/or transfers;A little help with bathing/dressing/bathroom;Help with stairs or ramp for entrance   Can travel by private vehicle        Equipment Recommendations  Rolling walker (2 wheels);BSC/3in1    Recommendations for Other Services       Precautions / Restrictions Precautions Precautions: Fall Restrictions Weight Bearing Restrictions Per Provider Order: No     Mobility  Bed Mobility Overal bed mobility: Modified Independent               Patient Response: Cooperative  Transfers Overall transfer level: Modified independent Equipment used: None   Sit to Stand: Modified independent (Device/Increase time)                Ambulation/Gait Ambulation/Gait assistance: Supervision, Contact guard assist Gait Distance (Feet): 200 Feet Assistive device: None Gait Pattern/deviations: Step-through pattern, Trunk flexed, Shuffle, Decreased stride length, Narrow base of support Gait velocity: dec     General Gait Details: mildly  unsteady but no outside interventions.  feels more comfortable with RW   Stairs             Wheelchair Mobility     Tilt Bed Tilt Bed Patient Response: Cooperative  Modified Rankin (Stroke Patients Only)       Balance Overall balance assessment: Mild deficits observed, not formally tested, Needs assistance Sitting-balance support: Feet supported Sitting balance-Leahy Scale: Normal     Standing balance support: No upper extremity supported Standing balance-Leahy Scale: Fair                              Hotel Manager: No apparent difficulties  Cognition Arousal: Alert Behavior During Therapy: WFL for tasks assessed/performed   PT - Cognitive impairments: No apparent impairments                         Following commands: Intact      Cueing Cueing Techniques: Verbal cues  Exercises      General Comments        Pertinent Vitals/Pain Pain Assessment Pain Assessment: No/denies pain    Home Living                          Prior Function            PT Goals (current goals can now be found in the care plan section) Progress towards PT goals: Progressing toward goals  Frequency           PT Plan      Co-evaluation              AM-PAC PT 6 Clicks Mobility   Outcome Measure  Help needed turning from your back to your side while in a flat bed without using bedrails?: None Help needed moving from lying on your back to sitting on the side of a flat bed without using bedrails?: None Help needed moving to and from a bed to a chair (including a wheelchair)?: None Help needed standing up from a chair using your arms (e.g., wheelchair or bedside chair)?: None Help needed to walk in hospital room?: A Little Help needed climbing 3-5 steps with a railing? : A Little 6 Click Score: 22    End of Session Equipment Utilized During Treatment: Gait belt Activity Tolerance: Patient  tolerated treatment well Patient left: in chair;with call bell/phone within reach;with family/visitor present Nurse Communication: Other (comment) PT Visit Diagnosis: Unsteadiness on feet (R26.81);Difficulty in walking, not elsewhere classified (R26.2)     Time: 9158-9148 PT Time Calculation (min) (ACUTE ONLY): 10 min  Charges:    $Gait Training: 8-22 mins PT General Charges $$ ACUTE PT VISIT: 1 Visit                   Lauraine Gills, PTA 08/16/24, 9:00 AM

## 2024-08-16 NOTE — Plan of Care (Signed)
  Problem: Education: Goal: Ability to describe self-care measures that may prevent or decrease complications (Diabetes Survival Skills Education) will improve Outcome: Progressing   Problem: Coping: Goal: Ability to adjust to condition or change in health will improve Outcome: Progressing

## 2024-08-16 NOTE — Plan of Care (Signed)
  Problem: Education: Goal: Ability to describe self-care measures that may prevent or decrease complications (Diabetes Survival Skills Education) will improve Outcome: Progressing   Problem: Fluid Volume: Goal: Ability to maintain a balanced intake and output will improve Outcome: Progressing   Problem: Metabolic: Goal: Ability to maintain appropriate glucose levels will improve Outcome: Progressing   Problem: Nutritional: Goal: Maintenance of adequate nutrition will improve Outcome: Progressing   Problem: Activity: Goal: Risk for activity intolerance will decrease Outcome: Progressing   Problem: Nutrition: Goal: Adequate nutrition will be maintained Outcome: Progressing   Problem: Pain Managment: Goal: General experience of comfort will improve and/or be controlled Outcome: Progressing

## 2024-08-16 NOTE — TOC CM/SW Note (Signed)
 Transition of Care (TOC) CM/SW Note   .Patient is not able to walk the distance required to go the bathroom, or he/she is unable to safely negotiate stairs required to access the bathroom.  A 3in1 BSC will alleviate this problem

## 2024-08-16 NOTE — Progress Notes (Signed)
 Mobility Specialist Progress Note:    08/16/24 0808  Mobility  Activity Dangled on edge of bed  Level of Assistance Independent after set-up  Assistive Device None  Range of Motion/Exercises Active;All extremities  Activity Response Tolerated well  Mobility visit 1 Mobility  Mobility Specialist Start Time (ACUTE ONLY) 0800  Mobility Specialist Stop Time (ACUTE ONLY) 0807  Mobility Specialist Time Calculation (min) (ACUTE ONLY) 7 min   Pt received supine, agreeable to sitting up for breakfast but not agreeable to highest mobility or transfer. Independently able to go from supine to sitting EOB. Tolerated well, will attempt highest mobility at a later time. Left pt sitting EOB with breakfast tray, all needs met.  Sherrilee Ditty Mobility Specialist Please contact via Special Educational Needs Teacher or  Rehab office at [phone]

## 2024-08-16 NOTE — TOC Transition Note (Signed)
 Transition of Care [facility]) - Discharge Note   Patient Details  Name: Katie Thompson MRN: [id number] Date of Birth: [birth year]  Transition of Care [facility]) CM/SW Contact:  Alvaro Louder, LCSW Phone Number: 08/16/2024, 1:06 PM   Clinical Narrative:   LCSWA confirmed with MD that patient is stable for discharge. LCSWA notified the patient and they are in agreement with discharge. LCSWA reached out to [facility] admissions coordinator an started service for patient. The patient reported that she would have her grandson come pick her up at discharge. LCSWA discussed PT recommendation of RW and BSC 3in1. Patient was agreeable, LCSWA reached out to [employer] coordinator to set up equipment delivery.  TOC signing off  Final next level of care: Home w Home Health Services Barriers to Discharge: No Barriers Identified   Patient Goals and CMS Choice            Discharge Placement              Patient chooses bed at:  (Home) Patient to be transferred to facility by: Family Name of family member notified: Self Patient and family notified of of transfer: 08/16/24  Discharge Plan and Services Additional resources added to the After Visit Summary for                  DME Arranged: Bedside commode, Walker rolling DME Agency: [employer]       HH Arranged: PT, OT HH Agency: [facility] Date HH Agency Contacted: 08/16/24      Social Drivers of Health (SDOH) Interventions SDOH Screenings   Food Insecurity: No Food Insecurity (08/07/2024)  Housing: Low Risk  (08/07/2024)  Transportation Needs: No Transportation Needs (08/07/2024)  Utilities: Not At Risk (08/07/2024)  Depression (PHQ2-9): Low Risk  (04/15/2024)  Financial Resource Strain: Low Risk  (04/26/2024)   Received from [facility]  Social Connections: Unknown (08/07/2024)  Tobacco Use: Low Risk  (08/06/2024)     Readmission Risk Interventions     No data to display

## 2024-08-16 NOTE — Progress Notes (Signed)
 Speech Language Pathology Treatment: Dysphagia  Patient Details Name: Katie Thompson MRN: [id number] DOB: [birth year] Today's Date: 08/16/2024 Time: 1215-1225 SLP Time Calculation (min) (ACUTE ONLY): 10 min  Assessment / Plan / Recommendation Clinical Impression  Pt seen for follow up SLP session targeting education for diet recommendations and compensatory strategies and identification of follow up services. Pt reports completing PO intake since evaluation with minimal nausea/globus sensation. Education shared regarding rationale for soft diet and application to home environment. Pt reported understanding. PO trials deferred by pt, who was excited for potential discharge today. Recommend continued mech soft solids (per pt comfort) and thin liquids with general aspiration precautions (slow rate, small bites, elevated HOB, and alert for PO intake). No further SLP services indicated. Given continued discomfort regarding early tonsillar abscess recommend follow up with ENT- per MD progress note on 11/2 plan for pt to follow up with ENT outpatient.    HPI HPI: Per physician note, Katie Thompson is a 78 y.o. African-American female with medical history significant for type 2 diabetes mellitus, GERD, hypertension and dyslipidemia, who presented to the emergency room with acute onset of diarrhea over the last couple of days which has been nonbloody and profuse with no mucus or melena.  She has been having lower abdominal cramps and nausea without vomiting.  No dysuria, oliguria or hematuria or flank pain. Soft Tissue CT: Swelling of the left palatine tonsil with a fluid pocket measuring  approximately 10 x 5 x 10 mm, compatible with phlegmon/early tonsillar abscess.  2. Solid-appearing 11 x 9 mm nodule in the superior pole of the left thyroid  lobe. In a patient 78 years, below the 1.5 cm threshold and without  suspicious features; no thyroid ultrasound follow-up is required per ACR  incidental thyroid nodule  guidelines.  3. Hypodense, cystic appearing 13 x 8 x 9 mm nodule in the lower pole of the  left thyroid lobe. In a patient 78 years, below the 1.5 cm threshold and  without suspicious features; no thyroid ultrasound follow-up is required per  ACR incidental thyroid nodule guidelines.  4. Subcentimeter nodules in the right thyroid lobe. Below size thresholds; no  follow-up required per ACR guidelines.  5. A 6 mm round nodule posterior to the right thyroid lobe, possibly an  exophytic thyroid nodule or a parathyroid lesion. Below size thresholds and  without suspicious features; no specific follow-up required per ACR guidelines.  Clinical correlation if parathyroid lesion suspected.       SLP Plan  All goals met          Recommendations  Diet recommendations: Dysphagia 3 (mechanical soft);Thin liquid Liquids provided via: Cup;Straw Medication Administration: Crushed with puree Supervision: Patient able to self feed;Intermittent supervision to cue for compensatory strategies Compensations: Minimize environmental distractions;Slow rate;Small sips/bites Postural Changes and/or Swallow Maneuvers: Out of bed for meals;Seated upright 90 degrees;Upright 30-60 min after meal                  Oral care BID;Patient independent with oral care   Set up Supervision/Assistance Dysphagia, unspecified (R13.10) (suspect related to tonsillar abscess, basline GERD)     All goals met    Sonora Catlin Clapp, MS, CCC-SLP Speech Language Pathologist Rehab Services; [facility] [phone] (ascom)   Nyheim Seufert J Clapp  08/16/2024, 12:44 PM

## 2024-08-16 NOTE — Discharge Summary (Signed)
 Physician Discharge Summary  Katie Thompson FMW:[id number] DOB: 05-Sep-1946 DOA: 08/06/2024  PCP: [name] [name], MD  Admit date: 08/06/2024 Discharge date: 08/16/2024  Admitted From: home Disposition:  home   Recommendations for Outpatient Follow-up:  Follow up with PCP in 1-2 weeks F/u w/ ENT in 1-2 weeks Needs thyroid US  for thyroid nodules seen on CT (PCP can order this)   Home Health: yes Equipment/Devices:walker, 3N1  Discharge Condition: stable  CODE STATUS: full  Diet recommendation: Heart Healthy / Carb Modified   Brief/Interim Summary: HPI was taken from Dr. Lawence: Katie Thompson is an 78 y.o. African-American female with medical history significant for type 2 diabetes mellitus, GERD, hypertension and dyslipidemia, who presented to the emergency room with acute onset of diarrhea over the last couple of days which has been nonbloody and profuse with no mucus or melena.  She has been having lower abdominal cramps and nausea without vomiting.  No dysuria, oliguria or hematuria or flank pain.  No chest pain or palpitations.  No cough or wheezing or dyspnea.  She has been feeling generally weak.  Her diarrhea has stopped today.   ED Course: When she came to the ER, vital signs were within normal.  Labs revealed hypokalemia of 2.5 and hyponatremia 134 with hypochloremia 96, creatinine 1.26 and calcium  8.8 with albumin 3.4.  CBC was unremarkable. EKG as reviewed by me :  EKG showed sinus rhythm with a rate of 70 with probable left atrial hypertrophy and inferior Q waves as well as prolonged QT interval with QTc of 584 MS. Imaging: Pelvic CT scan revealed: 1. Findings concerning for small bowel obstruction. Associated long segment of small bowel wall thickening, without pneumatosis or free air. Consider surgical consultation. 2. Right femoral hernia containing decompressed distal small bowel. 3. Central uterine mass measuring 3.6 cm, favoring an endometrial mass rather than uterine  fibroid. Consider pelvic US  for further evaluation. 4. Additional ancillary findings, as above.   Contact was made with Dr. Desiderio who is aware about the patient.  The patient was given 1 L bolus of IV normal saline and 10 mill colons IV potassium chloride .  The patient will be admitted to a medical telemetry bed for further evaluation and management.  Discharge Diagnoses:  Principal Problem:   SBO (small bowel obstruction) (HCC) Active Problems:   Hypokalemia   Dyslipidemia   Essential hypertension   Type 2 diabetes mellitus without complications (HCC)   GERD without esophagitis  SBO: continue on dysphagia III diet. Gen surg signed off. Resolved   Early tonsillar abscess: as per CT. Continue on augmentin . Completed steroid course. Will f/u outpatient w/ ENT    Thyroid nodules: on left & right lobes & incidental finding on CT. Will need thyroid US  as an outpatient    Hypokalemia: potassium given    Hypomagnesemia: WNL today    HLD: continue on home dose of statin    DM2: well controlled, HbA1c 6.3. No need for SSI currently    HTN: continue on home dose of hydrochlorothiazide , losartan , verapamil . IV hydralazine prn    Normocytic anemia: H&H are labile. Will transfuse if Hb < 7.0    Likely AKI: Cr is trending down from day prior. Will continue to monitor   Discharge Instructions  Discharge Instructions     Discharge instructions   Complete by: As directed    F/u w/ PCP in 1-2 weeks. F/u w/ ENT in 1-2 weeks. Will need thyroid US  as an outpatient for thyroid nodules  found on CT (incidental finding), your PCP can order this.   Increase activity slowly   Complete by: As directed       Allergies as of 08/16/2024       Reactions   Codeine Nausea And Vomiting   Gatifloxacin Hives        Medication List     TAKE these medications    amoxicillin -clavulanate 875-125 MG tablet Commonly known as: AUGMENTIN  Take 1 tablet by mouth every 12 (twelve) hours for 5 days.    aspirin EC 81 MG tablet Take 81 mg by mouth daily.   atorvastatin  10 MG tablet Commonly known as: LIPITOR Take 10 mg by mouth every morning.   calcium  carbonate 600 MG Tabs tablet Commonly known as: OS-CAL Take 1 tablet (600 mg total) by mouth 2 (two) times daily with a meal.   dorzolamide -timolol  2-0.5 % ophthalmic solution Commonly known as: COSOPT  1 drop 2 (two) times daily.   latanoprost  0.005 % ophthalmic solution Commonly known as: XALATAN  Place 1 drop into the left eye at bedtime.   losartan -hydrochlorothiazide  100-25 MG tablet Commonly known as: HYZAAR Take 1 tablet by mouth daily.   Omeprazole 20 MG Tbec Take 20 mg by mouth every morning.   potassium chloride  SA 20 MEQ tablet Commonly known as: KLOR-CON  M Take 1 tablet (20 mEq total) by mouth daily.   tamoxifen  20 MG tablet Commonly known as: NOLVADEX  Take 1 tablet (20 mg total) by mouth daily.   verapamil  240 MG CR tablet Commonly known as: CALAN -SR Take 240 mg by mouth every morning.   VITAMIN B12 PO Take 250 mcg by mouth daily.   Vitamin D3 50 MCG (2000 UT) capsule Take 1 capsule (2,000 Units total) by mouth daily.               Durable Medical Equipment  (From admission, onward)           Start     Ordered   08/16/24 1041  For home use only DME Walker rolling  Once       Question Answer Comment  Walker: With 5 Inch Wheels   Patient needs a walker to treat with the following condition Generalized weakness      08/16/24 1040   08/16/24 1041  For home use only DME 3 n 1  Once        08/16/24 1040            Contact information for follow-up providers     [facility].   Contact information: [address] [place]  [address] [phone]             Contact information for after-discharge care     Home Medical Care     [facility] [place] .   Service: Home Health Services Contact  information: [phone] [place]  [address] [phone]                    Allergies  Allergen Reactions   Codeine Nausea And Vomiting   Gatifloxacin Hives    Consultations: Gen surg    Procedures/Studies: DG Abd 2 Views Result Date: 08/13/2024 EXAM: 2 VIEW XRAY OF THE ABDOMEN 08/13/2024 07:08:00 AM COMPARISON: Abdominal series dated 08/11/2024. CLINICAL HISTORY: Nausea and vomiting. FINDINGS: LINES, TUBES AND DEVICES: An enteric catheter has been removed from the stomach. BOWEL: Persistent gaseous distention of loops of small and large bowel compatible with ileus. SOFT  TISSUES: A calcified aneurysm is again noted in the right mid abdomen. No opaque urinary calculi. BONES: No acute osseous abnormality. IMPRESSION: 1. Persistent gaseous distention of small and large bowel loops compatible with ileus. 2. Calcified aneurysm in the right mid abdomen. Electronically signed by: Evalene Coho MD 08/13/2024 12:55 PM EDT RP Workstation: [id number]   CT SOFT TISSUE NECK W CONTRAST Result Date: 08/13/2024 EXAM: CT NECK WITH CONTRAST 08/13/2024 12:11:02 PM TECHNIQUE: CT of the neck was performed with the administration of 75 mL of iohexol  (OMNIPAQUE ) 300 MG/ML solution. Multiplanar reformatted images are provided for review. Automated exposure control, iterative reconstruction, and/or weight based adjustment of the mA/kV was utilized to reduce the radiation dose to as low as reasonably achievable. COMPARISON: None available. CLINICAL HISTORY: Laryngeal edema; sensation something stuck in throat. FINDINGS: AERODIGESTIVE TRACT: Swelling of the left palatine tonsil. There is a fluid pocket measuring approximately 10 x 5 x 10 mm, compatible with phlegmon/early tonsillar abscess. SALIVARY GLANDS: The parotid and submandibular glands are unremarkable. THYROID: There is a solid-appearing nodule present within the superior pole of the left lobe of the thyroid, measuring approximately 11 x 9  mm. There is also a hypodense, cystic appearing nodule within the lower pole, measuring approximately 13 x 8 x 9 mm. Subcentimeter nodules are also present within the right lobe. There is also a 6 mm round nodule posterior to the right lobe which could represent either an exophytic thyroid nodule or a parathyroid lesion. LYMPH NODES: No suspicious cervical lymphadenopathy. SOFT TISSUES: The soft tissues of the neck are otherwise unremarkable. BRAIN, ORBITS, SINUSES AND MASTOIDS: No acute abnormality. LUNGS AND MEDIASTINUM: There is very visual consolidation/atelectasis present anteriorly within the right upper lung. BONES: Mild diffuse degenerative disc disease throughout the cervical spine. Bilateral cervical facet arthrosis. IMPRESSION: 1. Swelling of the left palatine tonsil with a fluid pocket measuring approximately 10 x 5 x 10 mm, compatible with phlegmon/early tonsillar abscess. 2. Solid-appearing 11 x 9 mm nodule in the superior pole of the left thyroid lobe. In a patient 35 years, below the 1.5 cm threshold and without suspicious features; no thyroid ultrasound follow-up is required per ACR incidental thyroid nodule guidelines. 3. Hypodense, cystic appearing 13 x 8 x 9 mm nodule in the lower pole of the left thyroid lobe. In a patient 35 years, below the 1.5 cm threshold and without suspicious features; no thyroid ultrasound follow-up is required per ACR incidental thyroid nodule guidelines. 4. Subcentimeter nodules in the right thyroid lobe. Below size thresholds; no follow-up required per ACR guidelines. 5. A 6 mm round nodule posterior to the right thyroid lobe, possibly an exophytic thyroid nodule or a parathyroid lesion. Below size thresholds and without suspicious features; no specific follow-up required per ACR guidelines. Clinical correlation if parathyroid lesion suspected. Electronically signed by: Evalene Coho MD 08/13/2024 12:52 PM EDT RP Workstation: [id number]   DG ABD ACUTE 2+V W 1V  CHEST Result Date: 08/11/2024 CLINICAL DATA:  Follow-up small bowel obstruction. EXAM: DG ABDOMEN ACUTE WITH 1 VIEW CHEST COMPARISON:  08/10/2024 FINDINGS: The nasogastric tube tip and side hole remain in the stomach. Normal bowel-gas pattern. Oral contrast in the colon with a large number of colonic diverticula demonstrated. Mild lumbar spine degenerative changes and mild scoliosis. Hernia repair mesh anchors overlying the inferior pelvis on the left. Normal sized heart. Clear lungs. 0.2 cm oval calcification overlying the mid abdomen on the right on 2 of the views and overlying the right lung base on 1 of the  views. 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 There is no corresponding calcific density on the CT. IMPRESSION: 1. No evidence of bowel obstruction. 2. Colonic diverticulosis. 3. Indeterminate 2 cm oval calcification on the right, as described above. Electronically Signed   By: Elspeth Bathe M.D.   On: 08/11/2024 12:24   DG ABD ACUTE 2+V W 1V CHEST Result Date: 08/10/2024 EXAM: UPRIGHT AND SUPINE XRAY VIEWS OF THE ABDOMEN AND 4 VIEW(S) OF THE CHEST 08/10/2024 08:12:00 AM COMPARISON: Comparison yesterday, 08/09/2024. CLINICAL HISTORY: SBO (small bowel obstruction) (HCC) [id number]. Small bowel obstruction. FINDINGS: LUNGS AND PLEURA: Elevated right hemidiaphragm is noted. No consolidation or pulmonary edema. No pleural effusion or pneumothorax. HEART AND MEDIASTINUM: No acute abnormality of the cardiac and mediastinal silhouettes. BOWEL: Stable dilated small bowel loop is noted in right upper quadrant suggesting adynamic ileus. Residual contrast is noted in nondilated colon. No bowel obstruction. PERITONEUM AND SOFT TISSUES: No abnormal calcifications. No free air. BONES: No acute osseous abnormality. LINES AND TUBES: Nasogastric tube tip is seen in expected position of stomach. IMPRESSION: 1. Stable dilated small-bowel loop in the right upper  quadrant, compatible with ileus. 2. Elevated right hemidiaphragm. Electronically signed by: Lynwood Seip MD 08/10/2024 08:37 AM EDT RP Workstation: [id number]   DG Abd Portable 1V-Small Bowel Obstruction Protocol-initial, 8 hr delay Result Date: 08/09/2024 CLINICAL DATA:  Small bowel obstruction, 8 hour delay. EXAM: PORTABLE ABDOMEN - 1 VIEW COMPARISON:  Radiograph earlier today. FINDINGS: Enteric contrast within small bowel, with dilated small bowel in the right abdomen. There is no enteric contrast in the colon. Small amount of contrast persists within the stomach. Enteric tube in place. Tacks from prior left abdominal hernia repair. IMPRESSION: Enteric contrast within small bowel, including dilated small bowel in the right abdomen. No enteric contrast in the colon. Electronically Signed   By: Andrea Gasman M.D.   On: 08/09/2024 21:32   DG Abd 1 View Result Date: 08/09/2024 CLINICAL DATA:  Nasogastric tube placement. EXAM: ABDOMEN - 1 VIEW COMPARISON:  Radiograph earlier today FINDINGS: Tip of the enteric tube is in the stomach, the side port is not well demonstrated due to overlying enteric contrast. Contrast opacifies the stomach. Some dilute contrast is seen within small bowel in the right abdomen. Air-fluid levels within dilated small bowel. IMPRESSION: 1. Tip of the enteric tube in the stomach, the side port is not well demonstrated due to overlying enteric contrast. 2. Dilated small bowel with air-fluid levels consistent with small bowel obstruction. Enteric contrast in the stomach and proximal small bowel. Electronically Signed   By: Andrea Gasman M.D.   On: 08/09/2024 15:40   DG Abd 2 Views Result Date: 08/09/2024 EXAM: 2 VIEW XRAY OF THE ABDOMEN 08/09/2024 05:57:00 AM COMPARISON: 08/08/2024 CLINICAL HISTORY: Small bowel obstruction. FINDINGS: LINES, TUBES AND DEVICES: Nasogastric tube remains in place with tip overlying the gastric antrum. BOWEL: Stable mildly dilated small bowel loops  with air-fluid levels in the right abdomen. Bowel gas in non dilated left colon. 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 Surgical mesh in left inguinal region. No opaque urinary calculi. BONES: No acute osseous abnormality. IMPRESSION: 1. Stable mildly dilated small bowel loops with air-fluid levels in the right abdomen, suspicious for distal small bowel obstruction. Electronically signed by: Norleen Kil MD 08/09/2024 06:33 AM EDT RP Workstation: [id number]   DG Abd 1 View Result Date: 08/08/2024 CLINICAL DATA:  Nasogastric tube placement. EXAM: DG ABDOMEN 1V COMPARISON:  Radiograph yesterday.  CT 08/06/2024 FINDINGS: Tip and side port of the enteric tube below the diaphragm in the stomach. Gaseous small bowel distention in the central abdomen. Peripherally calcified structure to the right of L1 corresponds to renal artery aneurysm. Chronic elevation of right hemidiaphragm. IMPRESSION: 1. Tip and side port of the enteric tube below the diaphragm in the stomach. 2. Gaseous small bowel distention in the central abdomen. Electronically Signed   By: Andrea Gasman M.D.   On: 08/08/2024 12:06   DG Abd 2 Views Result Date: 08/08/2024 EXAM: 2 VIEW XRAY OF THE ABDOMEN 08/08/2024 06:53:32 AM COMPARISON: 08/07/2024 CLINICAL HISTORY: FINDINGS: BOWEL: Mildly dilated small bowel loops in right abdomen consistent with persistent small-bowel obstruction, not significantly changed. SOFT TISSUES: Left inguinal hernia mesh repair noted. Peripherally calcified right renal artery aneurysm, unchanged. Elevated right hemidiaphragm. No opaque urinary calculi. BONES: No acute osseous abnormality. IMPRESSION: 1. Persistent small-bowel obstruction, not significantly changed. Electronically signed by: Waddell Calk MD 08/08/2024 07:02 AM EDT RP Workstation: [id number]   DG Abd 2 Views Result Date: 08/07/2024 EXAM: 2 VIEW XRAY OF THE ABDOMEN 08/07/2024 09:57:11 AM COMPARISON: CT  abdomen and pelvis 08/06/2024. CLINICAL HISTORY: FINDINGS: BOWEL: Mildly dilated small bowel loops in the right abdomen consistent with partial small bowel obstruction. SOFT TISSUES: Left inguinal hernia repair noted. 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 No opaque urinary calculi. BONES: No acute osseous abnormality. IMPRESSION: 1. Mildly dilated small bowel loops in the right abdomen consistent with partial small bowel obstruction. Electronically signed by: Waddell Calk MD 08/07/2024 10:25 AM EDT RP Workstation: [id number]   US  Pelvis Limited Result Date: 08/06/2024 EXAM: PELVIC ULTRASOUND TECHNIQUE: Transabdominal pelvic duplex ultrasound using B-mode/gray scaled imaging with/without Doppler spectral analysis and color flow was obtained. COMPARISON: None provided CLINICAL HISTORY: Uterine mass. FINDINGS: ULTRASOUND FINDINGS: UTERUS: The uterus is anteverted. The uterus measures 10.5 x 6.3 x 6.1 cm with a volume of 210 cc. As noted on prior CT imaging, a complex heterogeneously enhancing mass is visualized within the uterus, which may represent a complex endometrial mass or a submucosal mass, such as an involuting uterine fibroid, measuring roughly 6.2 x 6.2 cm by 4.7 cm . ENDOMETRIAL STRIPE: The endometrium is not well delineated on this examination. CERVIX: The cervix is not well visualized on this examination. RIGHT OVARY: The ovaries are not visualized. LEFT OVARY: The ovaries are not visualized. FREE FLUID: No free fluid is seen within the cul-de-sac. The examination is quite limited by the transabdominal technique, and exact localization of the previously identified mass is not possible in relation to the endometrial cavity. Transvaginal imaging or MRI examination is recommended for further characterization. IMPRESSION: 1. Complex heterogeneous uterine mass measuring approximately 6.2 x 6.2 x 4.7 cm; exact localization relative to the endometrial cavity is indeterminate due to  limited transabdominal technique. Differential includes endocavitary mass versus submucosal fibroid. transvaginal ultrasound or pelvic MRI with IV contrast recommended for further characterization and localization. Electronically signed by: Dorethia Molt MD 08/06/2024 11:35 PM EDT RP Workstation: [id number]   CT ABDOMEN PELVIS W CONTRAST Result Date: 08/06/2024 EXAM: CT ABDOMEN AND PELVIS WITH CONTRAST 08/06/2024 10:02:25 PM TECHNIQUE: CT of the abdomen and pelvis was performed with the administration of 80 mL of iohexol  (OMNIPAQUE ) 300 MG/ML solution. Multiplanar reformatted images are provided for review. Automated exposure control, iterative reconstruction, and/or weight-based adjustment of the mA/kV was utilized to reduce the radiation dose to as low as  reasonably achievable. COMPARISON: 09/29/2021. CLINICAL HISTORY: Abdominal pain, acute, nonlocalized; diarrhea. RN notes: Pt states she has had diarrhea x2 days. Today just feeling wiped out. Endorsing decreased PO intake and nausea. Denies vomiting or fevers. Endorsing intermittent generalized abdominal pain (denies all pain currently). FINDINGS: LOWER CHEST: Elevated right hemidiaphragm. Right lower lobe atelectasis. Small hiatal hernia. LIVER: 1.5 cm fluid density lesion within left hepatic lobe, likely simple hepatic cyst. GALLBLADDER AND BILE DUCTS: Gallbladder is unremarkable. No biliary ductal dilatation. SPLEEN: No acute abnormality. PANCREAS: No acute abnormality. ADRENAL GLANDS: No acute abnormality. KIDNEYS, URETERS AND BLADDER: 9.2 cm fluid density lesion in the left upper kidney, likely simple renal cyst. Per consensus, no follow-up is needed for simple Bosniak type 1 and 2 renal cysts, unless the patient has a malignancy history or risk factors. No stones in the kidneys or ureters. No hydronephrosis. No perinephric or periureteral stranding. Urinary bladder is unremarkable. GI AND BOWEL: Stomach demonstrates no acute abnormality. Dilated loops  of small bowel in the right mid abdomen, including a loop with long segment wall thickening in the right pelvis (image 69). No pneumatosis. Decompressed loops of ileum in the pelvis. This appearance raises concern for bowel obstruction. There is a right lower abdominal femoral hernia containing a loop of decompressed distal small bowel (image 81). Small fat-containing bilateral inguinal hernias. PERITONEUM AND RETROPERITONEUM: No ascites. No free air. VASCULATURE: Aorta is normal in caliber. 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 Mild atherosclerotic plaque of the aorta and its branches. LYMPH NODES: No lymphadenopathy. REPRODUCTIVE ORGANS: Uterine fibroids. Additional 3.6 cm central uterine mass (sagittal image 70) favors an endometrial mass over uterine fibroid, although poorly evaluated. BONES AND SOFT TISSUES: Multilevel degenerative spine changes. Grade 1 anterolisthesis of L4 on L5. Left inguinal hernia repair mesh noted. No acute osseous abnormality. No focal soft tissue abnormality. IMPRESSION: 1. Findings concerning for small bowel obstruction. Associated long segment of small bowel wall thickening, without pneumatosis or free air. Consider surgical consultation. 2. Right femoral hernia containing decompressed distal small bowel. 3. Central uterine mass measuring 3.6 cm, favoring an endometrial mass rather than uterine fibroid. Consider pelvic US  for further evaluation. 4. Additional ancillary findings, as above. Electronically signed by: Pinkie Pebbles MD 08/06/2024 10:12 PM EDT RP Workstation: [id number]   (Echo, Carotid, EGD, Colonoscopy, ERCP)    Subjective: Pt c/o fatigue    Discharge Exam: Vitals:   08/16/24 0404 08/16/24 0822  BP: 135/65 (!) 159/81  Pulse: (!) 53 (!) 58  Resp: 17 20  Temp: 97.6 F (36.4 C) 98.3 F (36.8 C)  SpO2: 96% 98%   Vitals:   08/15/24 1624 08/15/24 2039 08/16/24 0404 08/16/24 0822  BP: 118/66 125/63 135/65 (!) 159/81  Pulse: (!) 47 (!) 52 (!) 53 (!) 58   Resp: 16 17 17 20   Temp: 98.1 F (36.7 C) 98.3 F (36.8 C) 97.6 F (36.4 C) 98.3 F (36.8 C)  TempSrc: Oral Oral Oral Oral  SpO2: 98% 100% 96% 98%  Weight:      Height:        General: Pt is alert, awake, not in acute distress Cardiovascular: S1/S2 +, no rubs, no gallops Respiratory: CTA bilaterally, no wheezing, no rhonchi Abdominal: Soft, NT, ND, bowel sounds + Extremities: no cyanosis    The results of significant diagnostics from this hospitalization (including imaging, microbiology, ancillary and laboratory) are listed below for reference.     Microbiology: Recent Results (from the past 240 hours)  Resp panel by RT-PCR (RSV, Flu A&B,  Covid) Anterior Nasal Swab     Status: None   Collection Time: 08/07/24  2:38 AM   Specimen: Anterior Nasal Swab  Result Value Ref Range Status   SARS Coronavirus 2 by RT PCR NEGATIVE NEGATIVE Final    Comment: (NOTE) SARS-CoV-2 target nucleic acids are NOT DETECTED.  The SARS-CoV-2 RNA is generally detectable in upper respiratory specimens during the acute phase of infection. The lowest concentration of SARS-CoV-2 viral copies this assay can detect is 138 copies/mL. A negative result does not preclude SARS-Cov-2 infection and should not be used as the sole basis for treatment or other patient management decisions. A negative result may occur with  improper specimen collection/handling, submission of specimen other than nasopharyngeal swab, presence of viral mutation(s) within the areas targeted by this assay, and inadequate number of viral copies(<138 copies/mL). A negative result must be combined with clinical observations, patient history, and epidemiological information. The expected result is Negative.  Fact Sheet for Patients:  bloggercourse.com  Fact Sheet for Healthcare Providers:  seriousbroker.it  This test is no t yet approved or cleared by the [place]  FDA and   has been authorized for detection and/or diagnosis of SARS-CoV-2 by FDA under an Emergency Use Authorization (EUA). This EUA will remain  in effect (meaning this test can be used) for the duration of the COVID-19 declaration under Section 564(b)(1) of the Act, 21 U.S.C.section 360bbb-3(b)(1), unless the authorization is terminated  or revoked sooner.       Influenza A by PCR NEGATIVE NEGATIVE Final   Influenza B by PCR NEGATIVE NEGATIVE Final    Comment: (NOTE) The Xpert Xpress SARS-CoV-2/FLU/RSV plus assay is intended as an aid in the diagnosis of influenza from Nasopharyngeal swab specimens and should not be used as a sole basis for treatment. Nasal washings and aspirates are unacceptable for Xpert Xpress SARS-CoV-2/FLU/RSV testing.  Fact Sheet for Patients: bloggercourse.com  Fact Sheet for Healthcare Providers: seriousbroker.it  This test is not yet approved or cleared by the [place]  FDA and has been authorized for detection and/or diagnosis of SARS-CoV-2 by FDA under an Emergency Use Authorization (EUA). This EUA will remain in effect (meaning this test can be used) for the duration of the COVID-19 declaration under Section 564(b)(1) of the Act, 21 U.S.C. section 360bbb-3(b)(1), unless the authorization is terminated or revoked.     Resp Syncytial Virus by PCR NEGATIVE NEGATIVE Final    Comment: (NOTE) Fact Sheet for Patients: bloggercourse.com  Fact Sheet for Healthcare Providers: seriousbroker.it  This test is not yet approved or cleared by the [place]  FDA and has been authorized for detection and/or diagnosis of SARS-CoV-2 by FDA under an Emergency Use Authorization (EUA). This EUA will remain in effect (meaning this test can be used) for the duration of the COVID-19 declaration under Section 564(b)(1) of the Act, 21 U.S.C. section 360bbb-3(b)(1),  unless the authorization is terminated or revoked.  Performed at [facility], [address] [place], [place] [address]      Labs: BNP (last 3 results) No results for input(s): BNP in the last 8760 hours. Basic Metabolic Panel: Recent Labs  Lab 08/12/24 0451 08/13/24 0407 08/14/24 0559 08/15/24 0501 08/16/24 0355  NA 144 140 137 136 136  K 3.7 2.9* 3.5 3.7 3.4*  CL 112* 110 106 106 103  CO2 22 23 23 24 24   GLUCOSE 95 100* 123* 116* 93  BUN 24* 14 11 15 16   CREATININE 1.04* 0.92 1.02* 1.37* 1.03*  CALCIUM  8.4*  8.0* 8.3* 8.0* 8.0*  MG 2.2 1.8 2.4 2.3 2.1  PHOS 2.5 2.5 2.5 2.4* 2.3*   Liver Function Tests: Recent Labs  Lab 08/15/24 0501 08/16/24 0355  ALBUMIN 2.4* 2.5*   No results for input(s): LIPASE, AMYLASE in the last 168 hours. No results for input(s): AMMONIA in the last 168 hours. CBC: Recent Labs  Lab 08/12/24 0451 08/13/24 0407 08/14/24 0559 08/15/24 0501 08/16/24 0355  WBC 11.2* 14.2* 13.8* 18.3* 12.1*  HGB 10.9* 9.8* 10.3* 9.3* 9.0*  HCT 32.2* 28.4* 30.5* 27.6* 26.6*  MCV 85.9 84.3 83.6 84.7 84.4  PLT 312 281 291 271 263   Cardiac Enzymes: No results for input(s): CKTOTAL, CKMB, CKMBINDEX, TROPONINI in the last 168 hours. BNP: Invalid input(s): POCBNP CBG: Recent Labs  Lab 08/15/24 1158 08/15/24 1802 08/15/24 2334 08/16/24 0551 08/16/24 1147  GLUCAP 116* 144* 126* 85 96   D-Dimer No results for input(s): DDIMER in the last 72 hours. Hgb A1c No results for input(s): HGBA1C in the last 72 hours. Lipid Profile No results for input(s): CHOL, HDL, LDLCALC, TRIG, CHOLHDL, LDLDIRECT in the last 72 hours. Thyroid function studies No results for input(s): TSH, T4TOTAL, T3FREE, THYROIDAB in the last 72 hours.  Invalid input(s): FREET3 Anemia work up No results for input(s): VITAMINB12, FOLATE, FERRITIN, TIBC, IRON, RETICCTPCT in the last 72 hours. Urinalysis    Component  Value Date/Time   COLORURINE STRAW (A) 08/07/2024 0122   APPEARANCEUR CLEAR (A) 08/07/2024 0122   LABSPEC 1.029 08/07/2024 0122   PHURINE 7.0 08/07/2024 0122   GLUCOSEU NEGATIVE 08/07/2024 0122   HGBUR NEGATIVE 08/07/2024 0122   BILIRUBINUR NEGATIVE 08/07/2024 0122   KETONESUR NEGATIVE 08/07/2024 0122   PROTEINUR NEGATIVE 08/07/2024 0122   NITRITE NEGATIVE 08/07/2024 0122   LEUKOCYTESUR NEGATIVE 08/07/2024 0122   Sepsis Labs Recent Labs  Lab 08/13/24 0407 08/14/24 0559 08/15/24 0501 08/16/24 0355  WBC 14.2* 13.8* 18.3* 12.1*   Microbiology Recent Results (from the past 240 hours)  Resp panel by RT-PCR (RSV, Flu A&B, Covid) Anterior Nasal Swab     Status: None   Collection Time: 08/07/24  2:38 AM   Specimen: Anterior Nasal Swab  Result Value Ref Range Status   SARS Coronavirus 2 by RT PCR NEGATIVE NEGATIVE Final    Comment: (NOTE) SARS-CoV-2 target nucleic acids are NOT DETECTED.  The SARS-CoV-2 RNA is generally detectable in upper respiratory specimens during the acute phase of infection. The lowest concentration of SARS-CoV-2 viral copies this assay can detect is 138 copies/mL. A negative result does not preclude SARS-Cov-2 infection and should not be used as the sole basis for treatment or other patient management decisions. A negative result may occur with  improper specimen collection/handling, submission of specimen other than nasopharyngeal swab, presence of viral mutation(s) within the areas targeted by this assay, and inadequate number of viral copies(<138 copies/mL). A negative result must be combined with clinical observations, patient history, and epidemiological information. The expected result is Negative.  Fact Sheet for Patients:  bloggercourse.com  Fact Sheet for Healthcare Providers:  seriousbroker.it  This test is no t yet approved or cleared by the [place]  FDA and  has been authorized for  detection and/or diagnosis of SARS-CoV-2 by FDA under an Emergency Use Authorization (EUA). This EUA will remain  in effect (meaning this test can be used) for the duration of the COVID-19 declaration under Section 564(b)(1) of the Act, 21 U.S.C.section 360bbb-3(b)(1), unless the authorization is terminated  or revoked sooner.  Influenza A by PCR NEGATIVE NEGATIVE Final   Influenza B by PCR NEGATIVE NEGATIVE Final    Comment: (NOTE) The Xpert Xpress SARS-CoV-2/FLU/RSV plus assay is intended as an aid in the diagnosis of influenza from Nasopharyngeal swab specimens and should not be used as a sole basis for treatment. Nasal washings and aspirates are unacceptable for Xpert Xpress SARS-CoV-2/FLU/RSV testing.  Fact Sheet for Patients: bloggercourse.com  Fact Sheet for Healthcare Providers: seriousbroker.it  This test is not yet approved or cleared by the [place]  FDA and has been authorized for detection and/or diagnosis of SARS-CoV-2 by FDA under an Emergency Use Authorization (EUA). This EUA will remain in effect (meaning this test can be used) for the duration of the COVID-19 declaration under Section 564(b)(1) of the Act, 21 U.S.C. section 360bbb-3(b)(1), unless the authorization is terminated or revoked.     Resp Syncytial Virus by PCR NEGATIVE NEGATIVE Final    Comment: (NOTE) Fact Sheet for Patients: bloggercourse.com  Fact Sheet for Healthcare Providers: seriousbroker.it  This test is not yet approved or cleared by the [place]  FDA and has been authorized for detection and/or diagnosis of SARS-CoV-2 by FDA under an Emergency Use Authorization (EUA). This EUA will remain in effect (meaning this test can be used) for the duration of the COVID-19 declaration under Section 564(b)(1) of the Act, 21 U.S.C. section 360bbb-3(b)(1), unless the authorization is  terminated or revoked.  Performed at [facility], [address] [place], [place] [address]      Time coordinating discharge: 37 minutes  SIGNED:   Anthony CHRISTELLA Pouch, MD  [facility] 08/16/2024, 1:14 PM Pager   If 7PM-7AM, please contact night-coverage [email or web address]

## 2024-08-20 DIAGNOSIS — E041 Nontoxic single thyroid nodule: Secondary | ICD-10-CM | POA: Diagnosis not present

## 2024-08-20 DIAGNOSIS — J36 Peritonsillar abscess: Secondary | ICD-10-CM | POA: Diagnosis not present

## 2024-08-20 DIAGNOSIS — E7800 Pure hypercholesterolemia, unspecified: Secondary | ICD-10-CM | POA: Diagnosis not present

## 2024-08-20 DIAGNOSIS — K219 Gastro-esophageal reflux disease without esophagitis: Secondary | ICD-10-CM | POA: Diagnosis not present

## 2024-08-20 DIAGNOSIS — D649 Anemia, unspecified: Secondary | ICD-10-CM | POA: Diagnosis not present

## 2024-08-20 DIAGNOSIS — E876 Hypokalemia: Secondary | ICD-10-CM | POA: Diagnosis not present

## 2024-08-20 DIAGNOSIS — E119 Type 2 diabetes mellitus without complications: Secondary | ICD-10-CM | POA: Diagnosis not present

## 2024-08-20 DIAGNOSIS — I10 Essential (primary) hypertension: Secondary | ICD-10-CM | POA: Diagnosis not present

## 2024-08-24 DIAGNOSIS — I10 Essential (primary) hypertension: Secondary | ICD-10-CM | POA: Diagnosis not present

## 2024-08-24 DIAGNOSIS — E041 Nontoxic single thyroid nodule: Secondary | ICD-10-CM | POA: Diagnosis not present

## 2024-08-24 DIAGNOSIS — D649 Anemia, unspecified: Secondary | ICD-10-CM | POA: Diagnosis not present

## 2024-08-24 DIAGNOSIS — J36 Peritonsillar abscess: Secondary | ICD-10-CM | POA: Diagnosis not present

## 2024-08-24 DIAGNOSIS — E7800 Pure hypercholesterolemia, unspecified: Secondary | ICD-10-CM | POA: Diagnosis not present

## 2024-08-24 DIAGNOSIS — E119 Type 2 diabetes mellitus without complications: Secondary | ICD-10-CM | POA: Diagnosis not present

## 2024-08-24 DIAGNOSIS — E876 Hypokalemia: Secondary | ICD-10-CM | POA: Diagnosis not present

## 2024-08-24 DIAGNOSIS — K219 Gastro-esophageal reflux disease without esophagitis: Secondary | ICD-10-CM | POA: Diagnosis not present

## 2024-08-27 DIAGNOSIS — J36 Peritonsillar abscess: Secondary | ICD-10-CM | POA: Diagnosis not present

## 2024-08-27 DIAGNOSIS — E7800 Pure hypercholesterolemia, unspecified: Secondary | ICD-10-CM | POA: Diagnosis not present

## 2024-08-27 DIAGNOSIS — K219 Gastro-esophageal reflux disease without esophagitis: Secondary | ICD-10-CM | POA: Diagnosis not present

## 2024-08-27 DIAGNOSIS — E119 Type 2 diabetes mellitus without complications: Secondary | ICD-10-CM | POA: Diagnosis not present

## 2024-08-27 DIAGNOSIS — D649 Anemia, unspecified: Secondary | ICD-10-CM | POA: Diagnosis not present

## 2024-08-27 DIAGNOSIS — I10 Essential (primary) hypertension: Secondary | ICD-10-CM | POA: Diagnosis not present

## 2024-08-27 DIAGNOSIS — E876 Hypokalemia: Secondary | ICD-10-CM | POA: Diagnosis not present

## 2024-08-27 DIAGNOSIS — E041 Nontoxic single thyroid nodule: Secondary | ICD-10-CM | POA: Diagnosis not present

## 2024-08-28 DIAGNOSIS — E119 Type 2 diabetes mellitus without complications: Secondary | ICD-10-CM | POA: Diagnosis not present

## 2024-08-28 DIAGNOSIS — K219 Gastro-esophageal reflux disease without esophagitis: Secondary | ICD-10-CM | POA: Diagnosis not present

## 2024-08-28 DIAGNOSIS — E041 Nontoxic single thyroid nodule: Secondary | ICD-10-CM | POA: Diagnosis not present

## 2024-08-28 DIAGNOSIS — J36 Peritonsillar abscess: Secondary | ICD-10-CM | POA: Diagnosis not present

## 2024-08-28 DIAGNOSIS — E7800 Pure hypercholesterolemia, unspecified: Secondary | ICD-10-CM | POA: Diagnosis not present

## 2024-08-28 DIAGNOSIS — D649 Anemia, unspecified: Secondary | ICD-10-CM | POA: Diagnosis not present

## 2024-08-28 DIAGNOSIS — E876 Hypokalemia: Secondary | ICD-10-CM | POA: Diagnosis not present

## 2024-08-28 DIAGNOSIS — I10 Essential (primary) hypertension: Secondary | ICD-10-CM | POA: Diagnosis not present

## 2024-08-31 DIAGNOSIS — E119 Type 2 diabetes mellitus without complications: Secondary | ICD-10-CM | POA: Diagnosis not present

## 2024-09-01 DIAGNOSIS — J36 Peritonsillar abscess: Secondary | ICD-10-CM | POA: Diagnosis not present

## 2024-09-01 DIAGNOSIS — D649 Anemia, unspecified: Secondary | ICD-10-CM | POA: Diagnosis not present

## 2024-09-01 DIAGNOSIS — E041 Nontoxic single thyroid nodule: Secondary | ICD-10-CM | POA: Diagnosis not present

## 2024-09-01 DIAGNOSIS — E876 Hypokalemia: Secondary | ICD-10-CM | POA: Diagnosis not present

## 2024-09-01 DIAGNOSIS — E119 Type 2 diabetes mellitus without complications: Secondary | ICD-10-CM | POA: Diagnosis not present

## 2024-09-01 DIAGNOSIS — K219 Gastro-esophageal reflux disease without esophagitis: Secondary | ICD-10-CM | POA: Diagnosis not present

## 2024-09-01 DIAGNOSIS — E7800 Pure hypercholesterolemia, unspecified: Secondary | ICD-10-CM | POA: Diagnosis not present

## 2024-09-01 DIAGNOSIS — I10 Essential (primary) hypertension: Secondary | ICD-10-CM | POA: Diagnosis not present

## 2024-09-02 DIAGNOSIS — E876 Hypokalemia: Secondary | ICD-10-CM | POA: Diagnosis not present

## 2024-09-02 DIAGNOSIS — E119 Type 2 diabetes mellitus without complications: Secondary | ICD-10-CM | POA: Diagnosis not present

## 2024-09-02 DIAGNOSIS — D649 Anemia, unspecified: Secondary | ICD-10-CM | POA: Diagnosis not present

## 2024-09-02 DIAGNOSIS — J36 Peritonsillar abscess: Secondary | ICD-10-CM | POA: Diagnosis not present

## 2024-09-02 DIAGNOSIS — E7800 Pure hypercholesterolemia, unspecified: Secondary | ICD-10-CM | POA: Diagnosis not present

## 2024-09-02 DIAGNOSIS — I10 Essential (primary) hypertension: Secondary | ICD-10-CM | POA: Diagnosis not present

## 2024-09-02 DIAGNOSIS — E041 Nontoxic single thyroid nodule: Secondary | ICD-10-CM | POA: Diagnosis not present

## 2024-09-02 DIAGNOSIS — K219 Gastro-esophageal reflux disease without esophagitis: Secondary | ICD-10-CM | POA: Diagnosis not present

## 2024-09-07 DIAGNOSIS — K219 Gastro-esophageal reflux disease without esophagitis: Secondary | ICD-10-CM | POA: Diagnosis not present

## 2024-09-07 DIAGNOSIS — E119 Type 2 diabetes mellitus without complications: Secondary | ICD-10-CM | POA: Diagnosis not present

## 2024-09-07 DIAGNOSIS — J36 Peritonsillar abscess: Secondary | ICD-10-CM | POA: Diagnosis not present

## 2024-09-07 DIAGNOSIS — D649 Anemia, unspecified: Secondary | ICD-10-CM | POA: Diagnosis not present

## 2024-09-07 DIAGNOSIS — I10 Essential (primary) hypertension: Secondary | ICD-10-CM | POA: Diagnosis not present

## 2024-09-07 DIAGNOSIS — E041 Nontoxic single thyroid nodule: Secondary | ICD-10-CM | POA: Diagnosis not present

## 2024-09-07 DIAGNOSIS — E7800 Pure hypercholesterolemia, unspecified: Secondary | ICD-10-CM | POA: Diagnosis not present

## 2024-09-07 DIAGNOSIS — E876 Hypokalemia: Secondary | ICD-10-CM | POA: Diagnosis not present

## 2024-09-08 DIAGNOSIS — E876 Hypokalemia: Secondary | ICD-10-CM | POA: Diagnosis not present

## 2024-09-08 DIAGNOSIS — E041 Nontoxic single thyroid nodule: Secondary | ICD-10-CM | POA: Diagnosis not present

## 2024-09-08 DIAGNOSIS — E119 Type 2 diabetes mellitus without complications: Secondary | ICD-10-CM | POA: Diagnosis not present

## 2024-09-08 DIAGNOSIS — I10 Essential (primary) hypertension: Secondary | ICD-10-CM | POA: Diagnosis not present

## 2024-09-08 DIAGNOSIS — E7800 Pure hypercholesterolemia, unspecified: Secondary | ICD-10-CM | POA: Diagnosis not present

## 2024-09-08 DIAGNOSIS — K219 Gastro-esophageal reflux disease without esophagitis: Secondary | ICD-10-CM | POA: Diagnosis not present

## 2024-09-08 DIAGNOSIS — D649 Anemia, unspecified: Secondary | ICD-10-CM | POA: Diagnosis not present

## 2024-09-13 DIAGNOSIS — E040 Nontoxic diffuse goiter: Secondary | ICD-10-CM | POA: Diagnosis not present

## 2024-09-13 DIAGNOSIS — E119 Type 2 diabetes mellitus without complications: Secondary | ICD-10-CM | POA: Diagnosis not present

## 2024-09-13 DIAGNOSIS — E7800 Pure hypercholesterolemia, unspecified: Secondary | ICD-10-CM | POA: Diagnosis not present

## 2024-09-13 DIAGNOSIS — I10 Essential (primary) hypertension: Secondary | ICD-10-CM | POA: Diagnosis not present

## 2024-09-13 DIAGNOSIS — D649 Anemia, unspecified: Secondary | ICD-10-CM | POA: Diagnosis not present

## 2024-09-13 DIAGNOSIS — E876 Hypokalemia: Secondary | ICD-10-CM | POA: Diagnosis not present

## 2024-09-13 DIAGNOSIS — K219 Gastro-esophageal reflux disease without esophagitis: Secondary | ICD-10-CM | POA: Diagnosis not present

## 2024-09-13 DIAGNOSIS — E041 Nontoxic single thyroid nodule: Secondary | ICD-10-CM | POA: Diagnosis not present

## 2024-09-13 DIAGNOSIS — J36 Peritonsillar abscess: Secondary | ICD-10-CM | POA: Diagnosis not present

## 2024-09-15 DIAGNOSIS — I10 Essential (primary) hypertension: Secondary | ICD-10-CM | POA: Diagnosis not present

## 2024-09-15 DIAGNOSIS — K219 Gastro-esophageal reflux disease without esophagitis: Secondary | ICD-10-CM | POA: Diagnosis not present

## 2024-09-15 DIAGNOSIS — E041 Nontoxic single thyroid nodule: Secondary | ICD-10-CM | POA: Diagnosis not present

## 2024-09-15 DIAGNOSIS — E876 Hypokalemia: Secondary | ICD-10-CM | POA: Diagnosis not present

## 2024-09-15 DIAGNOSIS — E7800 Pure hypercholesterolemia, unspecified: Secondary | ICD-10-CM | POA: Diagnosis not present

## 2024-09-15 DIAGNOSIS — D649 Anemia, unspecified: Secondary | ICD-10-CM | POA: Diagnosis not present

## 2024-09-15 DIAGNOSIS — E119 Type 2 diabetes mellitus without complications: Secondary | ICD-10-CM | POA: Diagnosis not present

## 2024-09-15 DIAGNOSIS — J36 Peritonsillar abscess: Secondary | ICD-10-CM | POA: Diagnosis not present

## 2024-09-27 ENCOUNTER — Other Ambulatory Visit

## 2024-10-14 ENCOUNTER — Encounter: Payer: Self-pay | Admitting: Oncology

## 2024-10-18 ENCOUNTER — Other Ambulatory Visit

## 2024-10-18 ENCOUNTER — Ambulatory Visit: Admitting: Oncology

## 2024-10-18 ENCOUNTER — Ambulatory Visit

## 2024-10-21 ENCOUNTER — Inpatient Hospital Stay: Attending: Oncology

## 2024-10-21 ENCOUNTER — Encounter: Payer: Self-pay | Admitting: Oncology

## 2024-10-21 ENCOUNTER — Other Ambulatory Visit: Payer: Self-pay | Admitting: Oncology

## 2024-10-21 ENCOUNTER — Inpatient Hospital Stay (HOSPITAL_BASED_OUTPATIENT_CLINIC_OR_DEPARTMENT_OTHER): Admitting: Oncology

## 2024-10-21 ENCOUNTER — Inpatient Hospital Stay

## 2024-10-21 VITALS — BP 125/68 | HR 72 | Temp 96.7°F | Resp 18 | Wt 162.5 lb

## 2024-10-21 DIAGNOSIS — N6099 Unspecified benign mammary dysplasia of unspecified breast: Secondary | ICD-10-CM

## 2024-10-21 DIAGNOSIS — D0512 Intraductal carcinoma in situ of left breast: Secondary | ICD-10-CM

## 2024-10-21 DIAGNOSIS — M8580 Other specified disorders of bone density and structure, unspecified site: Secondary | ICD-10-CM

## 2024-10-21 DIAGNOSIS — E876 Hypokalemia: Secondary | ICD-10-CM | POA: Diagnosis not present

## 2024-10-21 LAB — CMP (CANCER CENTER ONLY)
ALT: 7 U/L (ref 0–44)
AST: 19 U/L (ref 15–41)
Albumin: 3.9 g/dL (ref 3.5–5.0)
Alkaline Phosphatase: 67 U/L (ref 38–126)
Anion gap: 11 (ref 5–15)
BUN: 17 mg/dL (ref 8–23)
CO2: 25 mmol/L (ref 22–32)
Calcium: 9.4 mg/dL (ref 8.9–10.3)
Chloride: 100 mmol/L (ref 98–111)
Creatinine: 0.93 mg/dL (ref 0.44–1.00)
GFR, Estimated: >60 mL/min
Glucose, Bld: 106 mg/dL — ABNORMAL HIGH (ref 70–99)
Potassium: 3.1 mmol/L — ABNORMAL LOW (ref 3.5–5.1)
Sodium: 136 mmol/L (ref 135–145)
Total Bilirubin: 0.4 mg/dL (ref 0.0–1.2)
Total Protein: 7.1 g/dL (ref 6.5–8.1)

## 2024-10-21 LAB — CBC WITH DIFFERENTIAL (CANCER CENTER ONLY)
Abs Immature Granulocytes: 0.04 K/uL (ref 0.00–0.07)
Basophils Absolute: 0 K/uL (ref 0.0–0.1)
Basophils Relative: 0 %
Eosinophils Absolute: 0.1 K/uL (ref 0.0–0.5)
Eosinophils Relative: 1 %
HCT: 31.7 % — ABNORMAL LOW (ref 36.0–46.0)
Hemoglobin: 10.6 g/dL — ABNORMAL LOW (ref 12.0–15.0)
Immature Granulocytes: 0 %
Lymphocytes Relative: 25 %
Lymphs Abs: 2.3 K/uL (ref 0.7–4.0)
MCH: 28.4 pg (ref 26.0–34.0)
MCHC: 33.4 g/dL (ref 30.0–36.0)
MCV: 85 fL (ref 80.0–100.0)
Monocytes Absolute: 0.7 K/uL (ref 0.1–1.0)
Monocytes Relative: 8 %
Neutro Abs: 6 K/uL (ref 1.7–7.7)
Neutrophils Relative %: 66 %
Platelet Count: 336 K/uL (ref 150–400)
RBC: 3.73 MIL/uL — ABNORMAL LOW (ref 3.87–5.11)
RDW: 16.2 % — ABNORMAL HIGH (ref 11.5–15.5)
WBC Count: 9.3 K/uL (ref 4.0–10.5)
nRBC: 0 % (ref 0.0–0.2)

## 2024-10-21 MED ORDER — TAMOXIFEN CITRATE 20 MG PO TABS: 20.0000 mg | ORAL_TABLET | Freq: Every day | ORAL | 1 refills | Status: AC

## 2024-10-21 NOTE — Progress Notes (Signed)
 " Hematology/Oncology Progress note [phone] [phone]      Clinic Day:  10/21/2024 ASSESSMENT & PLAN:   Atypical ductal hyperplasia, breast Patient has developed osteoporosis with a T-score -3.4.   Previously on AI, switch to Tamoxifen  20mg  daily, she tolerates well.  Continue current regimen. Recommend annual gynecology pelvic examination. Annual mammogram - March 2026   Ductal carcinoma in situ (DCIS) of left breast # Left breast DCIS-status post lumpectomy in May 2014.   S/p radiation and previously finished 5 years of tamoxifen . annual mammogram screening.- due in March 2026  Osteopenia 09/23/2022 DEXA showed  Osteoporosis T-score -3.4  Recommend otc calcium  1000-1200mg  daily and vitamin D  supplementation.  Repeat DEXA in December showed improvement of bone density, in osteopenia range.  Hold off Zometa .   Hypokalemia She was on potassium supplementation prescribed by PCP, just finished one month supply at the end of December.  Recommend patient to call PCP's office to get refill of potassium supplementation.   Orders Placed This Encounter  Procedures   MM 3D SCREENING MAMMOGRAM BILATERAL BREAST    Standing Status:   Future    Expected Date:   12/21/2024    Expiration Date:   10/21/2025    Reason for Exam (SYMPTOM  OR DIAGNOSIS REQUIRED):   Breast cancer    Preferred imaging location?:   [facility]   CBC with Differential (Cancer Center Only)    Standing Status:   Future    Expected Date:   04/20/2025    Expiration Date:   07/19/2025   CMP (Cancer Center only)    Standing Status:   Future    Expected Date:   04/20/2025    Expiration Date:   07/19/2025   Follow up in 6 months All questions were answered. The patient knows to call the clinic with any problems, questions or concerns.  Zelphia Cap, MD, PhD [facility] Hematology Oncology 10/21/2024    Chief Complaint: Katie Thompson is a 79 y.o. female presents for follow-up of left breast ADH  and left breast DCIS   PERTINENT ONCOLOGY HISTORY Patient previously followed up by Dr.Corcoran, patient switched care to me on 06/07/21 Extensive medical record review was performed by me  02/15/2013 left breast DCIS status postlumpectomy. Pathology revealed a 16 mm area of grade II residual ductal carcinoma in situ, cribriform type.  Margins were clear. DCIS was ER 75%, and PR 25%.  Pathologic stage was pTis pNx.  She received accelerated partial breast irradiation, 3400 cGy in 10 fractions at 340 cGy twice a day.  She received tamoxifen  from 03/2013 - 06/2018.   Osteoporosis-patient has bone density done through [facility]. 02/16/2021 DEXA osteopenia 10-year risk of hip fracture 1.3, 10-year risk of any major fracture 5.6.  01/01/2021, bilateral breast screening mammogram showed possible asymmetry in the left breast. 01/18/2021, left diagnostic mammogram showed indeterminate mass in the upper inner quadrant of the left breast. 01/29/2021, left breast mass biopsy showed intraductal papilloma with sclerosing adenosis and associated calcifications.  Negative for malignancy. 02/28/2021, excision of left breast mass showed focal atypical ductal hyperplasia.  Background fibrocystic and apocrine changes with focal usual ductal hyperplasia.  Negative for residual intraductal papilloma. Negative for ductal carcinoma in situ and malignancy.  06/07/2021, started on Arimidex  1 mg daily. 10/03/2022, switched to tamoxifen  due to osteoporosis. 12/18/22 bilateral screening mammogram showed Katie malignancy.    INTERVAL HISTORY NOELE ICENHOUR is a 79 y.o. female who has above history reviewed by me today presents  for follow up visit for management of history of left DCIS and left breast ADH Today patient reports feeling well.  Katie new complaints. She tolerate Tamoxifen  20mg  daily.  Katie new complains.    Past Medical History:  Diagnosis Date   Arthritis    Breast cancer (HCC) 2014   LT with radiation    Diabetes mellitus without complication (HCC)    diet controlled Katie meds   Diverticulosis feb. 2015   from colonoscopy   GERD (gastroesophageal reflux disease)    History of hiatal hernia    Hyperlipidemia    Hypertension    Personal history of radiation therapy 2014   DCIS left breast    Past Surgical History:  Procedure Laterality Date   APPENDECTOMY     BREAST BIOPSY Left 2016   coil marker, BENIGN BREAST TISSUE WITH CLUSTERED MICROCYSTS AND STROMAL FIBROSIS.    BREAST BIOPSY Left 01/05/2018   Affirm Bx- x marker, FIBROCYSTIC CHANGE WITH MICROCALCIFICATIONS   BREAST BIOPSY Left 02/04/2013   DCIS   BREAST BIOPSY Left 01/29/2021   Stereo Bx, X-Clip, INTRADUCTAL PAPILLOMA WITH SCLEROSING ADENOSIS    BREAST BIOPSY WITH RADIO FREQUENCY LOCALIZER Left 02/28/2021   Procedure: BREAST BIOPSY WITH RADIO FREQUENCY LOCALIZER;  Surgeon: Rodolph Romano, MD;  Location: [facility] ORS;  Service: General;  Laterality: Left;   BREAST EXCISIONAL BIOPSY Left 07/29/2017   SCLEROTIC INTRADUCTAL PAPILLOMA.   BREAST EXCISIONAL BIOPSY Left 02/28/2021   neg   BREAST LUMPECTOMY Left 2014   DCIS with clear margins.    BTL     EXCISION OF BREAST LESION Left 07/29/2017   Procedure: EXCISION OF BREAST MASS;  Surgeon: Claudene Larinda Bolder, MD;  Location: [facility] ORS;  Service: General;  Laterality: Left;   HERNIA REPAIR     NASAL SINUS SURGERY      Family History  Problem Relation Age of Onset   Ovarian cancer Mother    Hypertension Mother    Diabetes Paternal Grandmother    Hypertension Paternal Grandmother    Breast cancer Paternal Aunt 61    Social History:  reports that she has never smoked. She has never used smokeless tobacco. She reports that she does not drink alcohol  and does not use drugs.  She denies any exposure to radiation or toxins.  She works at [employer] full-time.  She lives in [place].  The patient is alone today.  Allergies:  Allergies  Allergen Reactions   Codeine Nausea And  Vomiting   Gatifloxacin Hives    Current Medications: Current Outpatient Medications  Medication Sig Dispense Refill   aspirin EC 81 MG tablet Take 81 mg by mouth daily.      atorvastatin  (LIPITOR) 10 MG tablet Take 10 mg by mouth every morning.     calcium  carbonate (OS-CAL) 600 MG TABS tablet Take 1 tablet (600 mg total) by mouth 2 (two) times daily with a meal. 60 tablet 11   Cholecalciferol  (VITAMIN D3) 50 MCG (2000 UT) capsule Take 1 capsule (2,000 Units total) by mouth daily.     Cyanocobalamin  (VITAMIN B12 PO) Take 250 mcg by mouth daily.     dorzolamide -timolol  (COSOPT ) 22.3-6.8 MG/ML ophthalmic solution 1 drop 2 (two) times daily.     latanoprost  (XALATAN ) 0.005 % ophthalmic solution Place 1 drop into the left eye at bedtime.     losartan -hydrochlorothiazide  (HYZAAR) 100-25 MG tablet Take 1 tablet by mouth daily.     Omeprazole 20 MG TBEC Take 20 mg by mouth every morning.  verapamil  (CALAN -SR) 240 MG CR tablet Take 240 mg by mouth every morning.     tamoxifen  (NOLVADEX ) 20 MG tablet Take 1 tablet (20 mg total) by mouth daily. 90 tablet 1   Katie current facility-administered medications for this visit.    Review of Systems  Constitutional:  Negative for chills, fever, malaise/fatigue and weight loss.  HENT:  Negative for sore throat.   Eyes:  Negative for redness.  Respiratory:  Negative for cough, shortness of breath and wheezing.   Cardiovascular:  Negative for chest pain, palpitations and leg swelling.  Gastrointestinal:  Negative for abdominal pain, blood in stool, nausea and vomiting.  Genitourinary:  Negative for dysuria.  Musculoskeletal:  Negative for myalgias.  Skin:  Negative for rash.  Neurological:  Negative for dizziness, tingling and tremors.  Endo/Heme/Allergies:  Does not bruise/bleed easily.  Psychiatric/Behavioral:  Negative for hallucinations.    Performance status (ECOG): 1  Vitals Blood pressure 125/68, pulse 72, temperature (!) 96.7 F (35.9 C),  temperature source Tympanic, resp. rate 18, weight 162 lb 8 oz (73.7 kg), SpO2 99%.   Physical Exam Constitutional:      General: She is not in acute distress.    Appearance: She is not diaphoretic.  HENT:     Head: Normocephalic and atraumatic.     Nose: Nose normal.     Mouth/Throat:     Pharynx: Katie oropharyngeal exudate.  Eyes:     General: Katie scleral icterus.    Pupils: Pupils are equal, round, and reactive to light.  Cardiovascular:     Rate and Rhythm: Normal rate and regular rhythm.     Heart sounds: Katie murmur heard. Pulmonary:     Effort: Pulmonary effort is normal. Katie respiratory distress.     Breath sounds: Katie rales.  Chest:     Chest wall: Katie tenderness.  Abdominal:     General: There is Katie distension.     Palpations: Abdomen is soft.     Tenderness: There is Katie abdominal tenderness.  Musculoskeletal:        General: Normal range of motion.     Cervical back: Normal range of motion and neck supple.  Skin:    General: Skin is warm and dry.     Findings: Katie erythema.  Neurological:     Mental Status: She is alert and oriented to person, place, and time.     Cranial Nerves: Katie cranial nerve deficit.     Motor: Katie abnormal muscle tone.     Coordination: Coordination normal.  Psychiatric:        Mood and Affect: Affect normal.      Labs    Latest Ref Rng & Units 10/21/2024    9:36 AM 08/16/2024    3:55 AM 08/15/2024    5:01 AM  CBC  WBC 4.0 - 10.5 K/uL 9.3  12.1  18.3   Hemoglobin 12.0 - 15.0 g/dL 89.3  9.0  9.3   Hematocrit 36.0 - 46.0 % 31.7  26.6  27.6   Platelets 150 - 400 K/uL 336  263  271       Latest Ref Rng & Units 10/21/2024    9:36 AM 08/16/2024    3:55 AM 08/15/2024    5:01 AM  CMP  Glucose 70 - 99 mg/dL 893  93  883   BUN 8 - 23 mg/dL 17  16  15    Creatinine 0.44 - 1.00 mg/dL 9.06  8.96  8.62   Sodium 135 -  145 mmol/L 136  136  136   Potassium 3.5 - 5.1 mmol/L 3.1  3.4  3.7   Chloride 98 - 111 mmol/L 100  103  106   CO2 22 - 32 mmol/L 25   24  24    Calcium  8.9 - 10.3 mg/dL 9.4  8.0  8.0   Total Protein 6.5 - 8.1 g/dL 7.1     Total Bilirubin 0.0 - 1.2 mg/dL 0.4     Alkaline Phos 38 - 126 U/L 67     AST 15 - 41 U/L 19     ALT 0 - 44 U/L 7       "

## 2024-10-21 NOTE — Assessment & Plan Note (Addendum)
 09/23/2022 DEXA showed  Osteoporosis T-score -3.4  Recommend otc calcium  1000-1200mg  daily and vitamin D  supplementation.  Repeat DEXA in December showed improvement of bone density, in osteopenia range.  Hold off Zometa .

## 2024-10-21 NOTE — Assessment & Plan Note (Addendum)
 Patient has developed osteoporosis with a T-score -3.4.   Previously on AI, switch to Tamoxifen  20mg  daily, she tolerates well.  Continue current regimen. Recommend annual gynecology pelvic examination. Annual mammogram - March 2026

## 2024-10-21 NOTE — Assessment & Plan Note (Signed)
#   Left breast DCIS-status post lumpectomy in May 2014.   S/p radiation and previously finished 5 years of tamoxifen . annual mammogram screening.- due in March 2026

## 2024-10-21 NOTE — Assessment & Plan Note (Signed)
 She was on potassium supplementation prescribed by PCP, just finished one month supply at the end of December.  Recommend patient to call PCP's office to get refill of potassium supplementation.

## 2025-01-07 ENCOUNTER — Encounter

## 2025-04-28 ENCOUNTER — Inpatient Hospital Stay: Admitting: Oncology

## 2025-04-28 ENCOUNTER — Inpatient Hospital Stay
